# Patient Record
Sex: Female | Born: 1990 | Race: Black or African American | Hispanic: No | Marital: Single | Smoking: Never smoker
Health system: Southern US, Community
[De-identification: ages and names within clinical notes are randomized; demographics above are authoritative.]

## PROBLEM LIST (undated history)

## (undated) ENCOUNTER — Inpatient Hospital Stay (HOSPITAL_COMMUNITY): Payer: Self-pay

## (undated) DIAGNOSIS — R87629 Unspecified abnormal cytological findings in specimens from vagina: Secondary | ICD-10-CM

## (undated) DIAGNOSIS — IMO0001 Reserved for inherently not codable concepts without codable children: Secondary | ICD-10-CM

## (undated) DIAGNOSIS — IMO0002 Reserved for concepts with insufficient information to code with codable children: Secondary | ICD-10-CM

## (undated) DIAGNOSIS — B977 Papillomavirus as the cause of diseases classified elsewhere: Secondary | ICD-10-CM

## (undated) DIAGNOSIS — N83209 Unspecified ovarian cyst, unspecified side: Secondary | ICD-10-CM

## (undated) DIAGNOSIS — Z9189 Other specified personal risk factors, not elsewhere classified: Secondary | ICD-10-CM

## (undated) DIAGNOSIS — Z8679 Personal history of other diseases of the circulatory system: Secondary | ICD-10-CM

## (undated) DIAGNOSIS — B009 Herpesviral infection, unspecified: Secondary | ICD-10-CM

## (undated) DIAGNOSIS — I10 Essential (primary) hypertension: Secondary | ICD-10-CM

## (undated) DIAGNOSIS — Z889 Allergy status to unspecified drugs, medicaments and biological substances status: Secondary | ICD-10-CM

## (undated) DIAGNOSIS — I209 Angina pectoris, unspecified: Secondary | ICD-10-CM

## (undated) DIAGNOSIS — Z8619 Personal history of other infectious and parasitic diseases: Secondary | ICD-10-CM

## (undated) DIAGNOSIS — A5901 Trichomonal vulvovaginitis: Secondary | ICD-10-CM

## (undated) DIAGNOSIS — A549 Gonococcal infection, unspecified: Secondary | ICD-10-CM

## (undated) DIAGNOSIS — B379 Candidiasis, unspecified: Secondary | ICD-10-CM

## (undated) DIAGNOSIS — R87619 Unspecified abnormal cytological findings in specimens from cervix uteri: Secondary | ICD-10-CM

## (undated) DIAGNOSIS — N390 Urinary tract infection, site not specified: Secondary | ICD-10-CM

## (undated) DIAGNOSIS — A749 Chlamydial infection, unspecified: Secondary | ICD-10-CM

## (undated) DIAGNOSIS — R896 Abnormal cytological findings in specimens from other organs, systems and tissues: Secondary | ICD-10-CM

## (undated) HISTORY — DX: Abnormal cytological findings in specimens from other organs, systems and tissues: R89.6

## (undated) HISTORY — DX: Angina pectoris, unspecified: I20.9

## (undated) HISTORY — DX: Personal history of other infectious and parasitic diseases: Z86.19

## (undated) HISTORY — DX: Candidiasis, unspecified: B37.9

## (undated) HISTORY — PX: DILATION AND CURETTAGE OF UTERUS: SHX78

## (undated) HISTORY — DX: Herpesviral infection, unspecified: B00.9

## (undated) HISTORY — DX: Trichomonal vulvovaginitis: A59.01

## (undated) HISTORY — DX: Personal history of other diseases of the circulatory system: Z86.79

## (undated) HISTORY — DX: Gonococcal infection, unspecified: A54.9

## (undated) HISTORY — DX: Papillomavirus as the cause of diseases classified elsewhere: B97.7

## (undated) HISTORY — DX: Chlamydial infection, unspecified: A74.9

## (undated) HISTORY — DX: Reserved for inherently not codable concepts without codable children: IMO0001

---
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
## 2008-05-22 ENCOUNTER — Encounter: Payer: Self-pay | Admitting: Gynecology

## 2008-05-22 ENCOUNTER — Other Ambulatory Visit: Admission: RE | Admit: 2008-05-22 | Discharge: 2008-05-22 | Payer: Self-pay | Admitting: Gynecology

## 2008-05-22 ENCOUNTER — Ambulatory Visit: Payer: Self-pay | Admitting: Gynecology

## 2008-06-27 ENCOUNTER — Ambulatory Visit: Payer: Self-pay | Admitting: Gynecology

## 2008-06-27 ENCOUNTER — Encounter: Payer: Self-pay | Admitting: Gynecology

## 2008-10-04 ENCOUNTER — Ambulatory Visit: Payer: Self-pay | Admitting: Gynecology

## 2009-01-25 ENCOUNTER — Emergency Department (HOSPITAL_COMMUNITY): Admission: EM | Admit: 2009-01-25 | Discharge: 2009-01-25 | Payer: Self-pay | Admitting: Emergency Medicine

## 2009-01-29 ENCOUNTER — Emergency Department (HOSPITAL_COMMUNITY): Admission: EM | Admit: 2009-01-29 | Discharge: 2009-01-29 | Payer: Self-pay | Admitting: Family Medicine

## 2009-07-30 ENCOUNTER — Inpatient Hospital Stay (HOSPITAL_COMMUNITY): Admission: AD | Admit: 2009-07-30 | Discharge: 2009-07-30 | Payer: Self-pay | Admitting: Obstetrics & Gynecology

## 2009-08-05 ENCOUNTER — Inpatient Hospital Stay (HOSPITAL_COMMUNITY): Admission: AD | Admit: 2009-08-05 | Discharge: 2009-08-05 | Payer: Self-pay | Admitting: Gynecology

## 2009-08-07 ENCOUNTER — Ambulatory Visit: Payer: Self-pay | Admitting: Gynecology

## 2009-08-13 ENCOUNTER — Inpatient Hospital Stay (HOSPITAL_COMMUNITY): Admission: AD | Admit: 2009-08-13 | Discharge: 2009-08-13 | Payer: Self-pay | Admitting: Gynecology

## 2009-08-16 ENCOUNTER — Ambulatory Visit: Payer: Self-pay | Admitting: Gynecology

## 2009-09-25 ENCOUNTER — Ambulatory Visit: Payer: Self-pay | Admitting: Gynecology

## 2010-01-28 ENCOUNTER — Ambulatory Visit: Payer: Self-pay | Admitting: Gynecology

## 2010-03-04 ENCOUNTER — Other Ambulatory Visit: Admission: RE | Admit: 2010-03-04 | Discharge: 2010-03-04 | Payer: Self-pay | Admitting: Gynecology

## 2010-03-04 ENCOUNTER — Ambulatory Visit: Payer: Self-pay | Admitting: Gynecology

## 2010-03-08 ENCOUNTER — Ambulatory Visit (HOSPITAL_COMMUNITY): Admission: RE | Admit: 2010-03-08 | Discharge: 2010-03-08 | Payer: Self-pay | Admitting: Gynecology

## 2010-04-23 ENCOUNTER — Ambulatory Visit: Payer: Self-pay | Admitting: Gynecology

## 2010-05-07 ENCOUNTER — Ambulatory Visit: Payer: Self-pay | Admitting: Gynecology

## 2010-07-04 ENCOUNTER — Emergency Department (HOSPITAL_COMMUNITY): Admission: EM | Admit: 2010-07-04 | Discharge: 2010-07-04 | Payer: Self-pay | Admitting: Family Medicine

## 2010-07-12 ENCOUNTER — Ambulatory Visit: Payer: Self-pay | Admitting: Gynecology

## 2010-08-06 ENCOUNTER — Ambulatory Visit: Payer: Self-pay | Admitting: Gynecology

## 2010-08-23 ENCOUNTER — Ambulatory Visit: Payer: Self-pay | Admitting: Gynecology

## 2010-09-10 ENCOUNTER — Inpatient Hospital Stay (HOSPITAL_COMMUNITY)
Admission: AD | Admit: 2010-09-10 | Discharge: 2010-09-10 | Payer: Self-pay | Source: Home / Self Care | Attending: Gynecology | Admitting: Gynecology

## 2010-09-11 LAB — URINALYSIS, ROUTINE W REFLEX MICROSCOPIC
Bilirubin Urine: NEGATIVE
Hgb urine dipstick: NEGATIVE
Ketones, ur: 15 mg/dL — AB
Nitrite: NEGATIVE
Protein, ur: NEGATIVE mg/dL
Specific Gravity, Urine: 1.015 (ref 1.005–1.030)
Urine Glucose, Fasting: NEGATIVE mg/dL
Urobilinogen, UA: 0.2 mg/dL (ref 0.0–1.0)
pH: 7 (ref 5.0–8.0)

## 2010-09-11 LAB — POCT PREGNANCY, URINE: Preg Test, Ur: NEGATIVE

## 2010-09-14 ENCOUNTER — Encounter: Payer: Self-pay | Admitting: Obstetrics & Gynecology

## 2010-09-16 LAB — GC/CHLAMYDIA PROBE AMP, GENITAL
Chlamydia, DNA Probe: NEGATIVE
GC Probe Amp, Genital: NEGATIVE

## 2010-11-05 LAB — WET PREP, GENITAL
Clue Cells Wet Prep HPF POC: NONE SEEN
Trich, Wet Prep: NONE SEEN

## 2010-11-05 LAB — POCT URINALYSIS DIPSTICK
Bilirubin Urine: NEGATIVE
Glucose, UA: NEGATIVE mg/dL
Ketones, ur: NEGATIVE mg/dL
Nitrite: NEGATIVE
Protein, ur: 30 mg/dL — AB
Specific Gravity, Urine: 1.02 (ref 1.005–1.030)
Urobilinogen, UA: 1 mg/dL (ref 0.0–1.0)
pH: 8.5 — ABNORMAL HIGH (ref 5.0–8.0)

## 2010-11-05 LAB — POCT PREGNANCY, URINE: Preg Test, Ur: NEGATIVE

## 2010-11-05 LAB — GC/CHLAMYDIA PROBE AMP, GENITAL
Chlamydia, DNA Probe: POSITIVE — AB
GC Probe Amp, Genital: NEGATIVE

## 2010-11-25 LAB — WET PREP, GENITAL
Clue Cells Wet Prep HPF POC: NONE SEEN
Trich, Wet Prep: NONE SEEN

## 2010-11-25 LAB — URINE CULTURE: Colony Count: 85000

## 2010-11-25 LAB — COMPREHENSIVE METABOLIC PANEL
Albumin: 3.5 g/dL (ref 3.5–5.2)
Alkaline Phosphatase: 59 U/L (ref 39–117)
BUN: 4 mg/dL — ABNORMAL LOW (ref 6–23)
Calcium: 9 mg/dL (ref 8.4–10.5)
Potassium: 3.4 mEq/L — ABNORMAL LOW (ref 3.5–5.1)
Total Protein: 6.4 g/dL (ref 6.0–8.3)

## 2010-11-25 LAB — URINE MICROSCOPIC-ADD ON

## 2010-11-25 LAB — DIFFERENTIAL
Basophils Relative: 0 % (ref 0–1)
Eosinophils Relative: 2 % (ref 0–5)
Lymphocytes Relative: 26 % (ref 12–46)
Monocytes Relative: 9 % (ref 3–12)
Neutro Abs: 4.6 10*3/uL (ref 1.7–7.7)
Neutrophils Relative %: 63 % (ref 43–77)

## 2010-11-25 LAB — CBC
HCT: 38.4 % (ref 36.0–46.0)
Hemoglobin: 12.3 g/dL (ref 12.0–15.0)
MCHC: 31.9 g/dL (ref 30.0–36.0)
MCV: 71.9 fL — ABNORMAL LOW (ref 78.0–100.0)
Platelets: 342 10*3/uL (ref 150–400)
RBC: 5.34 MIL/uL — ABNORMAL HIGH (ref 3.87–5.11)
RDW: 15.6 % — ABNORMAL HIGH (ref 11.5–15.5)
WBC: 7.3 10*3/uL (ref 4.0–10.5)

## 2010-11-25 LAB — URINALYSIS, ROUTINE W REFLEX MICROSCOPIC
Bilirubin Urine: NEGATIVE
Glucose, UA: NEGATIVE mg/dL
Hgb urine dipstick: NEGATIVE
Ketones, ur: NEGATIVE mg/dL
Nitrite: NEGATIVE
Protein, ur: NEGATIVE mg/dL
Specific Gravity, Urine: >1.03 — ABNORMAL HIGH (ref 1.005–1.030)
Urobilinogen, UA: 0.2 mg/dL (ref 0.0–1.0)
pH: 6 (ref 5.0–8.0)

## 2010-11-26 LAB — URINE CULTURE: Colony Count: 5000

## 2010-11-26 LAB — URINALYSIS, ROUTINE W REFLEX MICROSCOPIC
Nitrite: NEGATIVE
Protein, ur: NEGATIVE mg/dL
Specific Gravity, Urine: 1.02 (ref 1.005–1.030)
Urobilinogen, UA: 0.2 mg/dL (ref 0.0–1.0)

## 2010-11-26 LAB — URINE MICROSCOPIC-ADD ON

## 2010-11-26 LAB — ABO/RH: ABO/RH(D): B POS

## 2010-11-26 LAB — CBC
MCHC: 31.7 g/dL (ref 30.0–36.0)
MCV: 71.9 fL — ABNORMAL LOW (ref 78.0–100.0)
Platelets: 403 10*3/uL — ABNORMAL HIGH (ref 150–400)
RBC: 5.91 MIL/uL — ABNORMAL HIGH (ref 3.87–5.11)
WBC: 8.5 10*3/uL (ref 4.0–10.5)

## 2010-11-26 LAB — GC/CHLAMYDIA PROBE AMP, GENITAL: Chlamydia, DNA Probe: NEGATIVE

## 2010-11-26 LAB — HCG, QUANTITATIVE, PREGNANCY
hCG, Beta Chain, Quant, S: 11750 m[IU]/mL — ABNORMAL HIGH (ref <5)
hCG, Beta Chain, Quant, S: 2266 m[IU]/mL — ABNORMAL HIGH (ref <5)

## 2010-11-26 LAB — WET PREP, GENITAL: Yeast Wet Prep HPF POC: NONE SEEN

## 2010-12-02 LAB — POCT URINALYSIS DIP (DEVICE)
Bilirubin Urine: NEGATIVE
Ketones, ur: NEGATIVE mg/dL
Protein, ur: 30 mg/dL — AB
Specific Gravity, Urine: >=1.03 (ref 1.005–1.030)

## 2010-12-02 LAB — WET PREP, GENITAL
Clue Cells Wet Prep HPF POC: NONE SEEN
Trich, Wet Prep: NONE SEEN
WBC, Wet Prep HPF POC: NONE SEEN

## 2010-12-02 LAB — POCT PREGNANCY, URINE: Preg Test, Ur: NEGATIVE

## 2010-12-12 ENCOUNTER — Ambulatory Visit: Payer: Self-pay | Admitting: Gynecology

## 2011-01-01 ENCOUNTER — Ambulatory Visit (INDEPENDENT_AMBULATORY_CARE_PROVIDER_SITE_OTHER): Payer: 59 | Admitting: Gynecology

## 2011-01-01 DIAGNOSIS — Z113 Encounter for screening for infections with a predominantly sexual mode of transmission: Secondary | ICD-10-CM

## 2011-01-01 DIAGNOSIS — B373 Candidiasis of vulva and vagina: Secondary | ICD-10-CM

## 2011-01-01 DIAGNOSIS — N898 Other specified noninflammatory disorders of vagina: Secondary | ICD-10-CM

## 2011-01-01 DIAGNOSIS — B3731 Acute candidiasis of vulva and vagina: Secondary | ICD-10-CM

## 2011-02-06 ENCOUNTER — Ambulatory Visit: Payer: 59 | Admitting: Gynecology

## 2011-02-07 ENCOUNTER — Inpatient Hospital Stay (HOSPITAL_COMMUNITY)
Admission: AD | Admit: 2011-02-07 | Discharge: 2011-02-07 | Disposition: A | Discharge status: Home / Self Care | Payer: 59 | Source: Ambulatory Visit | Attending: Obstetrics & Gynecology | Admitting: Obstetrics & Gynecology

## 2011-02-07 DIAGNOSIS — R300 Dysuria: Secondary | ICD-10-CM | POA: Insufficient documentation

## 2011-02-07 DIAGNOSIS — N390 Urinary tract infection, site not specified: Secondary | ICD-10-CM | POA: Insufficient documentation

## 2011-02-07 LAB — URINALYSIS, ROUTINE W REFLEX MICROSCOPIC
Bilirubin Urine: NEGATIVE
Glucose, UA: NEGATIVE mg/dL
Specific Gravity, Urine: 1.025 (ref 1.005–1.030)
pH: 7 (ref 5.0–8.0)

## 2011-02-07 LAB — URINE MICROSCOPIC-ADD ON

## 2011-02-09 LAB — URINE CULTURE

## 2011-04-09 ENCOUNTER — Ambulatory Visit (INDEPENDENT_AMBULATORY_CARE_PROVIDER_SITE_OTHER): Payer: 59 | Admitting: Gynecology

## 2011-04-09 ENCOUNTER — Encounter: Payer: Self-pay | Admitting: Gynecology

## 2011-04-09 VITALS — BP 112/70

## 2011-04-09 DIAGNOSIS — A609 Anogenital herpesviral infection, unspecified: Secondary | ICD-10-CM

## 2011-04-09 DIAGNOSIS — B379 Candidiasis, unspecified: Secondary | ICD-10-CM

## 2011-04-09 DIAGNOSIS — L293 Anogenital pruritus, unspecified: Secondary | ICD-10-CM

## 2011-04-09 DIAGNOSIS — B009 Herpesviral infection, unspecified: Secondary | ICD-10-CM

## 2011-04-09 DIAGNOSIS — Z113 Encounter for screening for infections with a predominantly sexual mode of transmission: Secondary | ICD-10-CM

## 2011-04-09 DIAGNOSIS — N898 Other specified noninflammatory disorders of vagina: Secondary | ICD-10-CM

## 2011-04-09 MED ORDER — FLUCONAZOLE 150 MG PO TABS: 150.0000 mg | ORAL_TABLET | Freq: Once | ORAL | Status: AC

## 2011-04-09 MED ORDER — ACYCLOVIR 400 MG PO TABS: 800.0000 mg | ORAL_TABLET | Freq: Three times a day (TID) | ORAL | Status: AC

## 2011-04-09 MED ORDER — ACYCLOVIR 5 % EX CREA: 1.0000 "application " | TOPICAL_CREAM | CUTANEOUS | Status: DC

## 2011-04-09 NOTE — Progress Notes (Signed)
Patient to the office today with complaint of external genital itching as well as outbreak from her HSV. Last year she was diagnosed with HSV from a culture from the area of the fourchette. Review her record also indicated in May of this year she was treated for a yeast, trichomoniasis and Chlamydia infection. She has been treated for gonorrhea in 2006 2 times in 2007 for Chlamydia as was in 2009 in 2011.  Pelvic exam: Bartholin urethra Skene glands: Area of the fourchette with evidence of herpetic outbreak Vagina: Thick white discharge Cervix thick white discharge Bimanual exam: Not done  Assessment: HSV-2 outbreak. Will prescribe Valtrex 800 mg 3 times a day for the next 7-10 days. Acyclovir cream to apply 3-4 times a day. For her yeast infection prescription for Diflucan 150 mg one by mouth was prescribed. GC and Chlamydia culture obtained pending at time of this dictation. Patient will return back next month for her annual exam which is overdue.

## 2011-04-16 ENCOUNTER — Ambulatory Visit: Payer: 59 | Admitting: Gynecology

## 2011-04-26 ENCOUNTER — Encounter (HOSPITAL_COMMUNITY): Payer: Self-pay

## 2011-04-26 ENCOUNTER — Inpatient Hospital Stay (HOSPITAL_COMMUNITY)
Admission: AD | Admit: 2011-04-26 | Discharge: 2011-04-26 | Disposition: A | Discharge status: Home / Self Care | Payer: 59 | Source: Ambulatory Visit | Attending: Gynecology | Admitting: Gynecology

## 2011-04-26 DIAGNOSIS — R109 Unspecified abdominal pain: Secondary | ICD-10-CM

## 2011-04-26 DIAGNOSIS — R51 Headache: Secondary | ICD-10-CM

## 2011-04-26 DIAGNOSIS — R519 Headache, unspecified: Secondary | ICD-10-CM

## 2011-04-26 DIAGNOSIS — O26899 Other specified pregnancy related conditions, unspecified trimester: Secondary | ICD-10-CM

## 2011-04-26 DIAGNOSIS — O99891 Other specified diseases and conditions complicating pregnancy: Secondary | ICD-10-CM | POA: Insufficient documentation

## 2011-04-26 HISTORY — DX: Reserved for concepts with insufficient information to code with codable children: IMO0002

## 2011-04-26 HISTORY — DX: Urinary tract infection, site not specified: N39.0

## 2011-04-26 HISTORY — DX: Unspecified abnormal cytological findings in specimens from cervix uteri: R87.619

## 2011-04-26 HISTORY — DX: Herpesviral infection, unspecified: B00.9

## 2011-04-26 HISTORY — DX: Unspecified ovarian cyst, unspecified side: N83.209

## 2011-04-26 LAB — URINALYSIS, ROUTINE W REFLEX MICROSCOPIC
Glucose, UA: NEGATIVE mg/dL
Ketones, ur: NEGATIVE mg/dL
Leukocytes, UA: NEGATIVE
Protein, ur: NEGATIVE mg/dL
Urobilinogen, UA: 0.2 mg/dL (ref 0.0–1.0)

## 2011-04-26 LAB — WET PREP, GENITAL

## 2011-04-26 MED ORDER — ACETAMINOPHEN 500 MG PO TABS
1000.0000 mg | ORAL_TABLET | Freq: Once | ORAL | Status: AC
  Administered 2011-04-26: 1000 mg via ORAL

## 2011-04-26 NOTE — Progress Notes (Signed)
Pt states," I missed my period and had a positive HPT. I started having pain in my left lower abdomen, and somedays real bad cramps in my low abd."

## 2011-04-26 NOTE — ED Provider Notes (Signed)
History     Chief Complaint  Patient presents with  . Abdominal Pain   HPI Kara Fernandez 20 y.o. Comes in with headache, periodic LLQ cramping and missed menses.  Has an appointment in the office scheduled for Thursday.  Had not done a pregnancy test and did not know she is pregnant.    OB History    Grav Para Term Preterm Abortions TAB SAB Ect Mult Living   2    1 1           Past Medical History  Diagnosis Date  . Chlamydia infection     POSITIVE 10/06,8/07,7/09,11/11  . ASCUS on Pap smear     C&B 10/09 W/LGSIL  . HSV (herpes simplex virus) infection   . 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 Diabetes in pregnancy 01/2005  . Gonorrhea     POSITIVE 8/06, 10/06,  . GC (gonococcus infection)   . Trichomonal vulvovaginitis   . Ovarian cyst   . UTI (lower urinary tract infection)   . Abnormal Pap smear   . HPV (human papilloma virus) infection   . 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 Chlamydia     Past Surgical History  Procedure Date  . Dilation and curettage of uterus     abortion    Family History  Problem Relation Age of Onset  . Hypertension Mother   . Hypertension Father   . Hypertension Maternal Grandmother   . Hypertension Maternal Grandfather     History  Substance Use Topics  . Smoking status: Former Games developer  . Smokeless tobacco: Never Used  . Alcohol Use: 0.6 oz/week    1 Shots of liquor per week     occasionally    Allergies: No Known Allergies  Prescriptions prior to admission  Medication Sig Dispense Refill  . prenatal vitamin w/FE, FA (PRENATAL 1 + 1) 27-1 MG TABS Take 1 tablet by mouth daily.        Marland Kitchen VALTREX 1 G tablet Take 1,000 mg by mouth Once daily as needed. Take for 7-10 days for outbreaks      . DISCONTD: acyclovir (ZOVIRAX) 5 % cream Apply 1 application topically every 3 (three) hours.  15 g  1  . DISCONTD: Multiple Vitamin (MULTIVITAMIN) tablet Take 1 tablet by mouth  daily.        Marland Kitchen DISCONTD: norethindrone-ethinyl estradiol (JUNEL FE 1/20) 1-20 MG-MCG per tablet Take 1 tablet by mouth daily.          Review of Systems  Gastrointestinal: Positive for nausea. Negative for vomiting.  Genitourinary:       No vaginal discharge. No vaginal bleeding. No dysuria. Periodic LLQ cramping - none today    Neurological: Positive for headaches.   Physical Exam   Blood pressure 125/75, pulse 82, temperature 97.4 F (36.3 C), temperature source Oral, resp. rate 18, height 5' 1.5" (1.562 m), weight 131 lb 4 oz (59.535 kg), last menstrual period 03/23/2011.  Physical Exam  Nursing note and vitals reviewed. Constitutional: She is oriented to person, place, and time. She appears well-developed and well-nourished.  HENT:  Head: Normocephalic.  Eyes: EOM are normal.  Neck: Neck supple.  GI: Soft. There is no tenderness. There is no rebound and no guarding.  Genitourinary:       Speculum exam: Vagina - Small amount of creamy discharge, no odor Cervix - No contact  bleeding Bimanual exam: Cervix closed Uterus non tender, normal size Adnexa non tender, no masses bilaterally GC/Chlam, wet prep done Chaperone present for exam.    Musculoskeletal: Normal range of motion.  Neurological: She is alert and oriented to person, place, and time.  Skin: Skin is warm and dry.  Psychiatric: She has a normal mood and affect.    MAU Course  Procedures Consult with Dr. Audie Box re: plan of care.  MDM Results for orders placed during the hospital encounter of 04/26/11 (from the past 24 hour(s))  URINALYSIS, ROUTINE W REFLEX MICROSCOPIC     Status: Normal   Collection Time   04/26/11  8:25 PM      Component Value Range   Color, Urine YELLOW  YELLOW    Appearance CLEAR  CLEAR    Specific Gravity, Urine 1.020  1.005 - 1.030    pH 6.0  5.0 - 8.0    Glucose, UA NEGATIVE  NEGATIVE (mg/dL)   Hgb urine dipstick NEGATIVE  NEGATIVE    Bilirubin Urine NEGATIVE  NEGATIVE     Ketones, ur NEGATIVE  NEGATIVE (mg/dL)   Protein, ur NEGATIVE  NEGATIVE (mg/dL)   Urobilinogen, UA 0.2  0.0 - 1.0 (mg/dL)   Nitrite NEGATIVE  NEGATIVE    Leukocytes, UA NEGATIVE  NEGATIVE   POCT PREGNANCY, URINE     Status: Normal   Collection Time   04/26/11  8:43 PM      Component Value Range   Preg Test, Ur POSITIVE    WET PREP, GENITAL     Status: Abnormal   Collection Time   04/26/11  9:20 PM      Component Value Range   Yeast, Wet Prep NONE SEEN  NONE SEEN    Trich, Wet Prep NONE SEEN  NONE SEEN    Clue Cells, Wet Prep FEW (*) NONE SEEN    WBC, Wet Prep HPF POC FEW (*) NONE SEEN      Assessment and Plan  Early Pregnancy Headache  Plan: Your pregnancy test is positive.   Be seen in the office on Tuesday for follow up Return to MAU immediately if having vaginal bleeding or any worse abdominal pain. Drink at least 8 8-oz glasses of water every day. No smoking, no drugs, no alcohol.   Take a prenatal vitamin one by mouth every day.   Eat small frequent snacks to avoid nausea.   Take Tylenol 325 mg 2 tablets by mouth every 4 hours if needed for pain.    BURLESON,TERRI 04/26/2011, 10:05 PM   Nolene Bernheim, NP 04/26/11 2302

## 2011-04-26 NOTE — Progress Notes (Signed)
Patient is here with c/o left side pain for 3 weeks. She states that she she has missed her august periods, having sleepless nights, headache at the back of her head. She also c/o lower back pain, left lower quadrant. She has frequency in urinating. Denies any vaginal bleeding, or discharge.

## 2011-04-29 ENCOUNTER — Encounter: Payer: Self-pay | Admitting: Gynecology

## 2011-04-29 ENCOUNTER — Ambulatory Visit (INDEPENDENT_AMBULATORY_CARE_PROVIDER_SITE_OTHER): Payer: 59 | Admitting: Gynecology

## 2011-04-29 DIAGNOSIS — Z331 Pregnant state, incidental: Secondary | ICD-10-CM

## 2011-04-29 DIAGNOSIS — N912 Amenorrhea, unspecified: Secondary | ICD-10-CM

## 2011-04-29 DIAGNOSIS — N898 Other specified noninflammatory disorders of vagina: Secondary | ICD-10-CM

## 2011-04-29 LAB — POCT URINE PREGNANCY: Preg Test, Ur: POSITIVE

## 2011-04-29 NOTE — Progress Notes (Signed)
Patient is a 20 year old now gravida 2 para 1 AB 1 who presented to the office stating that she had a last menstrual period on July 29. She was not using any form of contraception and has otherwise been having normal menstrual cycles. She did a home pregnancy test x2 which was positive and was confirmed here in the office today as well. She denied a nausea or vomiting some headaches and breast tenderness. Review of her records indicated that she has had several outbreaks of gonorrhea Chlamydia infections as well as HSV. She denied any pelvic pain or any unusual vaginal bleeding.  Pelvic exam Bartholin urethra Skene glands: Normal Vagina: No gross lesions on inspection Cervix: No lesions or discharge Uterus: Anteverted normal size shape and consistency for 6 weeks size Adnexa: No palpable mass or tenderness Rectal exam: Not done  Assessment 20 year old gravida 2 para 1 AB 1 last menstrual period 03/23/2011 estimated gestational age based on last menstrual period 5-1/2 weeks with that educated as estimated date of confinement being 12/29/2010. Patient high-risk for ectopic pregnancy due the fact she's had multiple episodes of infections with gonorrhea and chlamydia as well as HSV. We will get a quantitative beta-hCG today call the patient within the next 48 hours with a result and determine when would be the next quantitative beta-hCG and subsequent ultrasound to confirm an intrauterine pregnancy as early as possible because of her high-risk nature. Patient is fully aware of the potential risk and warning signs had been discussed. She is already on her prenatal vitamins. And we'll follow accordingly. She states that she is going to have this pregnancy. The last pregnancy was an elective termination during the first trimester.

## 2011-04-29 NOTE — Patient Instructions (Signed)
Will call you later in week with instructions as to when to come by for another blood test and when to come in for ultrasound.

## 2011-05-01 ENCOUNTER — Ambulatory Visit (INDEPENDENT_AMBULATORY_CARE_PROVIDER_SITE_OTHER): Payer: 59 | Admitting: Gynecology

## 2011-05-01 ENCOUNTER — Other Ambulatory Visit: Payer: Self-pay

## 2011-05-01 ENCOUNTER — Ambulatory Visit: Payer: 59 | Admitting: Gynecology

## 2011-05-01 DIAGNOSIS — Z331 Pregnant state, incidental: Secondary | ICD-10-CM

## 2011-05-01 DIAGNOSIS — Z3490 Encounter for supervision of normal pregnancy, unspecified, unspecified trimester: Secondary | ICD-10-CM

## 2011-05-01 DIAGNOSIS — O9989 Other specified diseases and conditions complicating pregnancy, childbirth and the puerperium: Secondary | ICD-10-CM

## 2011-05-01 DIAGNOSIS — N912 Amenorrhea, unspecified: Secondary | ICD-10-CM

## 2011-05-01 LAB — US OB TRANSVAGINAL

## 2011-05-01 NOTE — Progress Notes (Signed)
Patient is a 20 year old who seen in the office on September 4 as a result of her been amenorrheic and a positive urine pregnancy test. We had done a quantitative beta-hCG on that day the value was 5258 milli-international units per mL. She is a high-risk for an ectopic pregnancy the fact that she has had several episodes with infections for gonorrhea and chlamydia as well as HSV. She was asymptomatic that day and her pelvic exam and demonstrated uterus 4-6 weeks size. She return today to for an ultrasound to confirm an intrauterine pregnancy and for possible viability.  Ultrasound today demonstrated an anteverted uterus with a single intrauterine gestational sac located in the fundus size consistent with dates 5 weeks and 4 days a normal-shaped yolk sac was seen. The fetal pole was seen cardiac activity not visualized yet right ovary had a small corpus luteum cyst measuring 20 mm left ovary was normal cervix was long closed and no apparent adnexal masses. Based on her last menstrual period and her ultrasound today her estimated date of confinement is 12/28/2011. She'll return back to the office in 3 weeks to confirm viability and that we'll refer her to one of my obstetrical colleagues. She's currently are prenatal vitamins. She denied any nausea vomiting and no complaints today.

## 2011-05-02 ENCOUNTER — Encounter: Payer: 59 | Admitting: Gynecology

## 2011-05-21 ENCOUNTER — Other Ambulatory Visit: Payer: 59

## 2011-05-21 ENCOUNTER — Ambulatory Visit (INDEPENDENT_AMBULATORY_CARE_PROVIDER_SITE_OTHER): Payer: 59 | Admitting: Gynecology

## 2011-05-21 ENCOUNTER — Ambulatory Visit: Payer: 59

## 2011-05-21 ENCOUNTER — Encounter: Payer: Self-pay | Admitting: Gynecology

## 2011-05-21 ENCOUNTER — Ambulatory Visit: Payer: 59 | Admitting: Women's Health

## 2011-05-21 ENCOUNTER — Ambulatory Visit: Payer: 59 | Admitting: Gynecology

## 2011-05-21 ENCOUNTER — Ambulatory Visit: Admission: RE | Admit: 2011-05-21 | Payer: 59 | Source: Ambulatory Visit

## 2011-05-21 DIAGNOSIS — O9989 Other specified diseases and conditions complicating pregnancy, childbirth and the puerperium: Secondary | ICD-10-CM

## 2011-05-21 DIAGNOSIS — O358XX0 Maternal care for other (suspected) fetal abnormality and damage, not applicable or unspecified: Secondary | ICD-10-CM

## 2011-05-21 DIAGNOSIS — N912 Amenorrhea, unspecified: Secondary | ICD-10-CM

## 2011-05-21 DIAGNOSIS — Z3200 Encounter for pregnancy test, result unknown: Secondary | ICD-10-CM

## 2011-05-21 LAB — US OB TRANSVAGINAL

## 2011-05-21 NOTE — Progress Notes (Signed)
Patient is a 20 year old now gravida 1 who was seen in the office on September 6 by she had a positive urine pregnancy test was followed with quantitative beta hCGs. She was considered a high-risk for an ectopic pregnancy due the fact she's had several episodes of pelvic infection to include gonorrhea and Chlamydia and has history of HSV. She had been asymptomatic otherwise with the exception of mild nausea. Patient presented to the office for ultrasound today the ultrasound today demonstrated an intrauterine viable pregnancy consistent with dates fetal pole was noted cardiac activity was noted 164 beats per minute. Fetal movement was evident. Noted was an umbilical cord cyst measured 7 x 6 mm. Amnion was seen. Normal yolk sac was seen. Right corpus luteum cyst was noted. Left ovary was normal cervix was long closed. Based on her last menstrual period and ultrasound she's currently 8 weeks and 3 days with an estimated date of confinement 12/27/2011. She's currently on prenatal vitamins and was given a prescription for Reglan to take one 10 mg tablet every 4-6 hours when necessary nausea vomiting. She will be referred to my obstetrical colleagues for her prenatal care and we'll send him a copy of this office note.

## 2011-05-29 ENCOUNTER — Encounter: Payer: Self-pay | Admitting: Gynecology

## 2011-06-07 ENCOUNTER — Inpatient Hospital Stay (HOSPITAL_COMMUNITY)
Admission: AD | Admit: 2011-06-07 | Discharge: 2011-06-07 | Disposition: A | Discharge status: Home / Self Care | Payer: 59 | Source: Ambulatory Visit | Attending: Obstetrics and Gynecology | Admitting: Obstetrics and Gynecology

## 2011-06-07 DIAGNOSIS — O2390 Unspecified genitourinary tract infection in pregnancy, unspecified trimester: Secondary | ICD-10-CM | POA: Insufficient documentation

## 2011-06-07 DIAGNOSIS — N390 Urinary tract infection, site not specified: Secondary | ICD-10-CM

## 2011-06-07 LAB — URINALYSIS, ROUTINE W REFLEX MICROSCOPIC
Glucose, UA: NEGATIVE mg/dL
Ketones, ur: 15 mg/dL — AB
Leukocytes, UA: NEGATIVE
Nitrite: NEGATIVE
Protein, ur: NEGATIVE mg/dL
Urobilinogen, UA: 0.2 mg/dL (ref 0.0–1.0)

## 2011-06-07 MED ORDER — CEPHALEXIN 500 MG PO CAPS: 500.0000 mg | ORAL_CAPSULE | Freq: Four times a day (QID) | ORAL | Status: AC

## 2011-06-07 NOTE — ED Provider Notes (Signed)
History   Pt presents today c/o urinary sx for the past several days. She states she has noticed a great deal of urinary frequency and hesitancy and today she noticed some increased pressure when she urinated. She denies abd pain, vag dc, bleeding, or burning. She states "it feels like other UTIs."  Chief Complaint  Patient presents with  . Urinary Tract Infection   HPI  OB History    Grav Para Term Preterm Abortions TAB SAB Ect Mult Living   2    1 1           Past Medical History  Diagnosis Date  . Chlamydia infection     POSITIVE 10/06,8/07,7/09,11/11  . ASCUS on Pap smear     C&B 10/09 W/LGSIL  . HSV (herpes simplex virus) infection   . 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 Gonorrhea     POSITIVE 8/06, 10/06,  . GC (gonococcus infection)   . Trichomonal vulvovaginitis   . Ovarian cyst   . UTI (lower urinary tract infection)   . Abnormal Pap smear   . HPV (human papilloma virus) infection   . 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 Chlamydia     Past Surgical History  Procedure Date  . Dilation and curettage of uterus     abortion    Family History  Problem Relation Age of Onset  . Hypertension Mother   . Hypertension Father   . Hypertension Maternal Grandmother   . Hypertension Maternal Grandfather     History  Substance Use Topics  . Smoking status: Former Games developer  . Smokeless tobacco: Never Used  . Alcohol Use: 0.6 oz/week    1 Shots of liquor per week     occasionally    Allergies: No Known Allergies  Prescriptions prior to admission  Medication Sig Dispense Refill  . prenatal vitamin w/FE, FA (PRENATAL 1 + 1) 27-1 MG TABS Take 1 tablet by mouth daily.       Marland Kitchen VALTREX 1 G tablet Take 1,000 mg by mouth Once daily as needed. Take for 7-10 days for outbreaks        Review of Systems  Constitutional: Negative for fever.  Cardiovascular: Negative for chest pain.  Gastrointestinal: Negative for  nausea, vomiting, abdominal pain, diarrhea and constipation.  Genitourinary: Positive for urgency and frequency. Negative for dysuria, hematuria and flank pain.  Neurological: Negative for dizziness and headaches.  Psychiatric/Behavioral: Negative for depression and suicidal ideas.   Physical Exam   Blood pressure 113/66, pulse 87, temperature 98.3 F (36.8 C), temperature source Oral, resp. rate 20, height 5\' 1"  (1.549 m), weight 127 lb 9.6 oz (57.879 kg), last menstrual period 03/23/2011.  Physical Exam  Constitutional: She is oriented to person, place, and time. She appears well-developed and well-nourished. No distress.  HENT:  Head: Normocephalic and atraumatic.  Eyes: EOM are normal. Pupils are equal, round, and reactive to light.  GI: Soft. She exhibits no distension and no mass. There is no tenderness. There is no rebound and no guarding.  Neurological: She is alert and oriented to person, place, and time.  Skin: Skin is warm and dry. She is not diaphoretic.  Psychiatric: She has a normal mood and affect. Her behavior is normal. Judgment and thought content normal.    MAU Course  Procedures  Results for orders placed during the hospital encounter of 06/07/11 (from the  past 24 hour(s))  URINALYSIS, ROUTINE W REFLEX MICROSCOPIC     Status: Abnormal   Collection Time   06/07/11  6:41 PM      Component Value Range   Color, Urine YELLOW  YELLOW    Appearance HAZY (*) CLEAR    Specific Gravity, Urine 1.025  1.005 - 1.030    pH 6.0  5.0 - 8.0    Glucose, UA NEGATIVE  NEGATIVE (mg/dL)   Hgb urine dipstick NEGATIVE  NEGATIVE    Bilirubin Urine NEGATIVE  NEGATIVE    Ketones, ur 15 (*) NEGATIVE (mg/dL)   Protein, ur NEGATIVE  NEGATIVE (mg/dL)   Urobilinogen, UA 0.2  0.0 - 1.0 (mg/dL)   Nitrite NEGATIVE  NEGATIVE    Leukocytes, UA NEGATIVE  NEGATIVE    Urine sent for culture.  Assessment and Plan  UTI sx: discussed with pt at length. Will tx prophylactically with keflex  despite neg UA. Will send urine for culture. Advised pt to f/u with her PCP. Discussed diet, activity, risks, and precautions.  [name], DrHSc, MPAS, PA-C  06/07/2011, 7:29 PM   [name], PA 06/07/11 1933

## 2011-06-07 NOTE — Progress Notes (Signed)
Pt reports she feels like she has a UTI. Pt reports urinary frequency and pain. Pt also reports occational  feelings like her heart is racing.

## 2011-06-09 LAB — URINE CULTURE: Colony Count: 30000

## 2011-06-09 NOTE — ED Provider Notes (Signed)
Agree with above note.  Kara Fernandez 06/09/2011 2:07 PM   

## 2011-07-21 LAB — OB RESULTS CONSOLE RPR: RPR: NONREACTIVE

## 2011-07-21 LAB — OB RESULTS CONSOLE HEPATITIS B SURFACE ANTIGEN: Hepatitis B Surface Ag: NEGATIVE

## 2011-07-21 LAB — CBC: Platelets: 352 10*3/uL (ref 150–399)

## 2011-10-22 ENCOUNTER — Encounter (INDEPENDENT_AMBULATORY_CARE_PROVIDER_SITE_OTHER): Payer: Medicaid Other | Admitting: Registered Nurse

## 2011-10-22 DIAGNOSIS — Z331 Pregnant state, incidental: Secondary | ICD-10-CM

## 2011-11-06 ENCOUNTER — Encounter: Payer: Medicaid Other | Admitting: Obstetrics and Gynecology

## 2011-11-13 ENCOUNTER — Encounter (INDEPENDENT_AMBULATORY_CARE_PROVIDER_SITE_OTHER): Payer: Medicaid Other | Admitting: Obstetrics and Gynecology

## 2011-11-13 DIAGNOSIS — Z3480 Encounter for supervision of other normal pregnancy, unspecified trimester: Secondary | ICD-10-CM

## 2011-11-27 ENCOUNTER — Encounter (INDEPENDENT_AMBULATORY_CARE_PROVIDER_SITE_OTHER): Payer: Medicaid Other | Admitting: Registered Nurse

## 2011-11-27 DIAGNOSIS — Z331 Pregnant state, incidental: Secondary | ICD-10-CM

## 2011-12-10 DIAGNOSIS — B009 Herpesviral infection, unspecified: Secondary | ICD-10-CM | POA: Insufficient documentation

## 2011-12-11 ENCOUNTER — Ambulatory Visit (INDEPENDENT_AMBULATORY_CARE_PROVIDER_SITE_OTHER): Payer: Medicaid Other | Admitting: Obstetrics and Gynecology

## 2011-12-11 ENCOUNTER — Encounter: Payer: Self-pay | Admitting: Obstetrics and Gynecology

## 2011-12-11 VITALS — BP 102/56 | Wt 150.0 lb

## 2011-12-11 DIAGNOSIS — Z331 Pregnant state, incidental: Secondary | ICD-10-CM

## 2011-12-11 MED ORDER — VALACYCLOVIR HCL 500 MG PO TABS: 500.0000 mg | ORAL_TABLET | Freq: Every day | ORAL | Status: DC

## 2011-12-11 NOTE — Patient Instructions (Signed)

## 2011-12-11 NOTE — Progress Notes (Signed)
Kara Fernandez [redacted]w[redacted]d   Doing well GFM No ctx, VB or LOF C/o pedal and hand edema, enc inc PO fluids, pt states she's only eating ice Denies any prodrome sx's  Plan: GBS today Valtrex 500mg  PO daily - prophylaxis  FKC and labor sx's rv'd

## 2011-12-13 LAB — STREP B DNA PROBE: GBSP: NEGATIVE

## 2011-12-17 ENCOUNTER — Ambulatory Visit (INDEPENDENT_AMBULATORY_CARE_PROVIDER_SITE_OTHER): Payer: Medicaid Other | Admitting: Obstetrics and Gynecology

## 2011-12-17 ENCOUNTER — Encounter: Payer: Self-pay | Admitting: Obstetrics and Gynecology

## 2011-12-17 VITALS — BP 100/56 | Wt 153.0 lb

## 2011-12-17 DIAGNOSIS — B009 Herpesviral infection, unspecified: Secondary | ICD-10-CM

## 2011-12-17 DIAGNOSIS — Z331 Pregnant state, incidental: Secondary | ICD-10-CM

## 2011-12-17 NOTE — Progress Notes (Signed)
Pt. Stated no issues today.  

## 2011-12-17 NOTE — Progress Notes (Signed)
No complaints today.Taking Valtrex. GBS negative

## 2011-12-25 ENCOUNTER — Encounter: Payer: Self-pay | Admitting: Obstetrics and Gynecology

## 2011-12-25 ENCOUNTER — Ambulatory Visit (INDEPENDENT_AMBULATORY_CARE_PROVIDER_SITE_OTHER): Payer: Medicaid Other | Admitting: Obstetrics and Gynecology

## 2011-12-25 VITALS — BP 104/70 | Wt 155.0 lb

## 2011-12-25 DIAGNOSIS — Z3400 Encounter for supervision of normal first pregnancy, unspecified trimester: Secondary | ICD-10-CM

## 2011-12-25 NOTE — Progress Notes (Signed)
No complaints Doing well Sched induction at 41wks FKCs and Labor Precautions RTO 1wk for ROB with NST

## 2011-12-29 ENCOUNTER — Encounter (HOSPITAL_COMMUNITY): Payer: Self-pay | Admitting: Anesthesiology

## 2011-12-29 ENCOUNTER — Encounter (HOSPITAL_COMMUNITY): Payer: Self-pay | Admitting: *Deleted

## 2011-12-29 ENCOUNTER — Encounter (HOSPITAL_COMMUNITY)
Admission: AD | Disposition: A | Discharge status: Home / Self Care | Payer: Self-pay | Source: Ambulatory Visit | Attending: Obstetrics and Gynecology

## 2011-12-29 ENCOUNTER — Inpatient Hospital Stay (HOSPITAL_COMMUNITY)
Admission: AD | Admit: 2011-12-29 | Discharge: 2012-01-01 | DRG: 766 | Disposition: A | Discharge status: Home / Self Care | Payer: Medicaid Other | Source: Ambulatory Visit | Attending: Obstetrics and Gynecology | Admitting: Obstetrics and Gynecology

## 2011-12-29 ENCOUNTER — Inpatient Hospital Stay (HOSPITAL_COMMUNITY): Payer: Medicaid Other | Admitting: Anesthesiology

## 2011-12-29 DIAGNOSIS — O43899 Other placental disorders, unspecified trimester: Secondary | ICD-10-CM

## 2011-12-29 DIAGNOSIS — O9903 Anemia complicating the puerperium: Secondary | ICD-10-CM | POA: Diagnosis not present

## 2011-12-29 DIAGNOSIS — D649 Anemia, unspecified: Secondary | ICD-10-CM | POA: Diagnosis not present

## 2011-12-29 DIAGNOSIS — O368990 Maternal care for other specified fetal problems, unspecified trimester, not applicable or unspecified: Secondary | ICD-10-CM

## 2011-12-29 DIAGNOSIS — Z8619 Personal history of other infectious and parasitic diseases: Secondary | ICD-10-CM | POA: Diagnosis not present

## 2011-12-29 DIAGNOSIS — Z8709 Personal history of other diseases of the respiratory system: Secondary | ICD-10-CM

## 2011-12-29 DIAGNOSIS — O368390 Maternal care for abnormalities of the fetal heart rate or rhythm, unspecified trimester, not applicable or unspecified: Secondary | ICD-10-CM | POA: Diagnosis not present

## 2011-12-29 LAB — CBC
HCT: 35.2 % — ABNORMAL LOW (ref 36.0–46.0)
MCH: 20.8 pg — ABNORMAL LOW (ref 26.0–34.0)
MCV: 66.4 fL — ABNORMAL LOW (ref 78.0–100.0)
Platelets: 401 10*3/uL — ABNORMAL HIGH (ref 150–400)
RBC: 5.3 MIL/uL — ABNORMAL HIGH (ref 3.87–5.11)

## 2011-12-29 SURGERY — Surgical Case
Anesthesia: Regional | Site: Abdomen | Wound class: Clean Contaminated

## 2011-12-29 MED ORDER — EPHEDRINE 5 MG/ML INJ
10.0000 mg | INTRAVENOUS | Status: DC | PRN
  Filled 2011-12-29: qty 2

## 2011-12-29 MED ORDER — FENTANYL 2.5 MCG/ML BUPIVACAINE 1/10 % EPIDURAL INFUSION (WH - ANES)
14.0000 mL/h | INTRAMUSCULAR | Status: DC
  Administered 2011-12-29: 14 mL/h via EPIDURAL
  Filled 2011-12-29: qty 60

## 2011-12-29 MED ORDER — DIPHENHYDRAMINE HCL 50 MG/ML IJ SOLN: 12.5000 mg | INTRAMUSCULAR | Status: DC | PRN

## 2011-12-29 MED ORDER — OXYTOCIN BOLUS FROM INFUSION
500.0000 mL | Freq: Once | INTRAVENOUS | Status: DC
  Filled 2011-12-29: qty 500

## 2011-12-29 MED ORDER — SODIUM CHLORIDE 0.9 % IJ SOLN: 3.0000 mL | INTRAMUSCULAR | Status: DC | PRN

## 2011-12-29 MED ORDER — NALOXONE HCL 0.4 MG/ML IJ SOLN: 0.4000 mg | INTRAMUSCULAR | Status: DC | PRN

## 2011-12-29 MED ORDER — EPHEDRINE 5 MG/ML INJ
10.0000 mg | INTRAVENOUS | Status: DC | PRN
  Filled 2011-12-29: qty 4
  Filled 2011-12-29: qty 2

## 2011-12-29 MED ORDER — SODIUM CHLORIDE 0.9 % IV SOLN
1.0000 ug/kg/h | INTRAVENOUS | Status: DC | PRN
  Filled 2011-12-29: qty 2.5

## 2011-12-29 MED ORDER — ONDANSETRON HCL 4 MG/2ML IJ SOLN: 4.0000 mg | Freq: Four times a day (QID) | INTRAMUSCULAR | Status: DC | PRN

## 2011-12-29 MED ORDER — LACTATED RINGERS IV SOLN: 500.0000 mL | Freq: Once | INTRAVENOUS | Status: DC

## 2011-12-29 MED ORDER — NALBUPHINE HCL 10 MG/ML IJ SOLN
5.0000 mg | INTRAMUSCULAR | Status: DC | PRN
  Administered 2011-12-29 – 2011-12-30 (×3): 10 mg via SUBCUTANEOUS
  Filled 2011-12-29 (×3): qty 1

## 2011-12-29 MED ORDER — PHENYLEPHRINE 40 MCG/ML (10ML) SYRINGE FOR IV PUSH (FOR BLOOD PRESSURE SUPPORT)
80.0000 ug | PREFILLED_SYRINGE | INTRAVENOUS | Status: DC | PRN
  Filled 2011-12-29: qty 2

## 2011-12-29 MED ORDER — ACETAMINOPHEN 325 MG PO TABS: 650.0000 mg | ORAL_TABLET | ORAL | Status: DC | PRN

## 2011-12-29 MED ORDER — SODIUM BICARBONATE 8.4 % IV SOLN
INTRAVENOUS | Status: AC
  Filled 2011-12-29: qty 50

## 2011-12-29 MED ORDER — SCOPOLAMINE 1 MG/3DAYS TD PT72
MEDICATED_PATCH | TRANSDERMAL | Status: AC
  Administered 2011-12-29: 1.5 mg via TRANSDERMAL
  Filled 2011-12-29: qty 1

## 2011-12-29 MED ORDER — LACTATED RINGERS IV SOLN
INTRAVENOUS | Status: DC
  Administered 2011-12-29 – 2011-12-30 (×2): via INTRAVENOUS

## 2011-12-29 MED ORDER — ONDANSETRON HCL 4 MG/2ML IJ SOLN
INTRAMUSCULAR | Status: AC
  Filled 2011-12-29: qty 2

## 2011-12-29 MED ORDER — LACTATED RINGERS IV SOLN
INTRAVENOUS | Status: DC
  Administered 2011-12-29 (×4): via INTRAVENOUS

## 2011-12-29 MED ORDER — LANOLIN HYDROUS EX OINT: 1.0000 "application " | TOPICAL_OINTMENT | CUTANEOUS | Status: DC | PRN

## 2011-12-29 MED ORDER — FENTANYL 2.5 MCG/ML BUPIVACAINE 1/10 % EPIDURAL INFUSION (WH - ANES)
INTRAMUSCULAR | Status: DC | PRN
  Administered 2011-12-29: 12 mL/h via EPIDURAL

## 2011-12-29 MED ORDER — DIBUCAINE 1 % RE OINT: 1.0000 "application " | TOPICAL_OINTMENT | RECTAL | Status: DC | PRN

## 2011-12-29 MED ORDER — KETOROLAC TROMETHAMINE 30 MG/ML IJ SOLN: 30.0000 mg | Freq: Four times a day (QID) | INTRAMUSCULAR | Status: AC | PRN

## 2011-12-29 MED ORDER — IBUPROFEN 600 MG PO TABS: 600.0000 mg | ORAL_TABLET | Freq: Four times a day (QID) | ORAL | Status: DC | PRN

## 2011-12-29 MED ORDER — OXYTOCIN 10 UNIT/ML IJ SOLN
INTRAMUSCULAR | Status: AC
  Filled 2011-12-29: qty 2

## 2011-12-29 MED ORDER — DIPHENHYDRAMINE HCL 50 MG/ML IJ SOLN: 25.0000 mg | INTRAMUSCULAR | Status: DC | PRN

## 2011-12-29 MED ORDER — LACTATED RINGERS IV SOLN: 500.0000 mL | INTRAVENOUS | Status: DC | PRN

## 2011-12-29 MED ORDER — PHENYLEPHRINE 40 MCG/ML (10ML) SYRINGE FOR IV PUSH (FOR BLOOD PRESSURE SUPPORT)
PREFILLED_SYRINGE | INTRAVENOUS | Status: AC
  Filled 2011-12-29: qty 5

## 2011-12-29 MED ORDER — SENNOSIDES-DOCUSATE SODIUM 8.6-50 MG PO TABS
2.0000 | ORAL_TABLET | Freq: Every day | ORAL | Status: DC
  Administered 2011-12-29 – 2011-12-31 (×3): 2 via ORAL

## 2011-12-29 MED ORDER — METOCLOPRAMIDE HCL 5 MG/ML IJ SOLN: 10.0000 mg | Freq: Three times a day (TID) | INTRAMUSCULAR | Status: DC | PRN

## 2011-12-29 MED ORDER — NALBUPHINE HCL 10 MG/ML IJ SOLN
5.0000 mg | INTRAMUSCULAR | Status: DC | PRN
  Filled 2011-12-29 (×3): qty 1

## 2011-12-29 MED ORDER — PHENYLEPHRINE HCL 10 MG/ML IJ SOLN
INTRAMUSCULAR | Status: DC | PRN
  Administered 2011-12-29 (×2): 80 ug via INTRAVENOUS
  Administered 2011-12-29: 40 ug via INTRAVENOUS

## 2011-12-29 MED ORDER — OXYCODONE-ACETAMINOPHEN 5-325 MG PO TABS: 1.0000 | ORAL_TABLET | ORAL | Status: DC | PRN

## 2011-12-29 MED ORDER — MORPHINE SULFATE 0.5 MG/ML IJ SOLN
INTRAMUSCULAR | Status: AC
  Filled 2011-12-29: qty 10

## 2011-12-29 MED ORDER — DIPHENHYDRAMINE HCL 25 MG PO CAPS: 25.0000 mg | ORAL_CAPSULE | Freq: Four times a day (QID) | ORAL | Status: DC | PRN

## 2011-12-29 MED ORDER — OXYTOCIN 20 UNITS IN LACTATED RINGERS INFUSION - SIMPLE: 125.0000 mL/h | INTRAVENOUS | Status: AC

## 2011-12-29 MED ORDER — LACTATED RINGERS IV SOLN
INTRAVENOUS | Status: DC | PRN
  Administered 2011-12-29 (×2): via INTRAVENOUS

## 2011-12-29 MED ORDER — ONDANSETRON HCL 4 MG/2ML IJ SOLN: 4.0000 mg | Freq: Three times a day (TID) | INTRAMUSCULAR | Status: DC | PRN

## 2011-12-29 MED ORDER — PRENATAL MULTIVITAMIN CH
1.0000 | ORAL_TABLET | Freq: Every day | ORAL | Status: DC
  Administered 2011-12-30 – 2012-01-01 (×3): 1 via ORAL
  Filled 2011-12-29 (×3): qty 1

## 2011-12-29 MED ORDER — DIPHENHYDRAMINE HCL 25 MG PO CAPS
25.0000 mg | ORAL_CAPSULE | ORAL | Status: DC | PRN
  Administered 2011-12-30: 25 mg via ORAL
  Filled 2011-12-29: qty 1

## 2011-12-29 MED ORDER — CEFAZOLIN SODIUM 1-5 GM-% IV SOLN
INTRAVENOUS | Status: DC | PRN
  Administered 2011-12-29: 2 g via INTRAVENOUS

## 2011-12-29 MED ORDER — SCOPOLAMINE 1 MG/3DAYS TD PT72
1.0000 | MEDICATED_PATCH | Freq: Once | TRANSDERMAL | Status: AC
  Administered 2011-12-29: 1.5 mg via TRANSDERMAL

## 2011-12-29 MED ORDER — ONDANSETRON HCL 4 MG/2ML IJ SOLN
INTRAMUSCULAR | Status: DC | PRN
  Administered 2011-12-29: 4 mg via INTRAVENOUS

## 2011-12-29 MED ORDER — LIDOCAINE-EPINEPHRINE (PF) 2 %-1:200000 IJ SOLN
INTRAMUSCULAR | Status: AC
  Filled 2011-12-29: qty 20

## 2011-12-29 MED ORDER — SIMETHICONE 80 MG PO CHEW: 80.0000 mg | CHEWABLE_TABLET | ORAL | Status: DC | PRN

## 2011-12-29 MED ORDER — OXYCODONE-ACETAMINOPHEN 5-325 MG PO TABS
1.0000 | ORAL_TABLET | ORAL | Status: DC | PRN
  Administered 2011-12-30 – 2011-12-31 (×4): 1 via ORAL
  Administered 2012-01-01: 2 via ORAL
  Filled 2011-12-29 (×2): qty 1
  Filled 2011-12-29: qty 2
  Filled 2011-12-29 (×2): qty 1

## 2011-12-29 MED ORDER — FLEET ENEMA 7-19 GM/118ML RE ENEM: 1.0000 | ENEMA | RECTAL | Status: DC | PRN

## 2011-12-29 MED ORDER — ONDANSETRON HCL 4 MG PO TABS: 4.0000 mg | ORAL_TABLET | ORAL | Status: DC | PRN

## 2011-12-29 MED ORDER — MEPERIDINE HCL 25 MG/ML IJ SOLN: 6.2500 mg | INTRAMUSCULAR | Status: DC | PRN

## 2011-12-29 MED ORDER — MENTHOL 3 MG MT LOZG: 1.0000 | LOZENGE | OROMUCOSAL | Status: DC | PRN

## 2011-12-29 MED ORDER — PHENYLEPHRINE 40 MCG/ML (10ML) SYRINGE FOR IV PUSH (FOR BLOOD PRESSURE SUPPORT)
80.0000 ug | PREFILLED_SYRINGE | INTRAVENOUS | Status: DC | PRN
  Filled 2011-12-29: qty 5
  Filled 2011-12-29: qty 2

## 2011-12-29 MED ORDER — OXYTOCIN 20 UNITS IN LACTATED RINGERS INFUSION - SIMPLE: 125.0000 mL/h | Freq: Once | INTRAVENOUS | Status: DC

## 2011-12-29 MED ORDER — CITRIC ACID-SODIUM CITRATE 334-500 MG/5ML PO SOLN
30.0000 mL | ORAL | Status: DC | PRN
  Administered 2011-12-29: 30 mL via ORAL
  Filled 2011-12-29: qty 15

## 2011-12-29 MED ORDER — OXYTOCIN 10 UNIT/ML IJ SOLN
INTRAMUSCULAR | Status: DC | PRN
  Administered 2011-12-29: 20 [IU] via INTRAMUSCULAR

## 2011-12-29 MED ORDER — FENTANYL CITRATE 0.05 MG/ML IJ SOLN: 25.0000 ug | INTRAMUSCULAR | Status: DC | PRN

## 2011-12-29 MED ORDER — DIPHENHYDRAMINE HCL 50 MG/ML IJ SOLN
12.5000 mg | INTRAMUSCULAR | Status: DC | PRN
  Administered 2011-12-29: 12.5 mg via INTRAVENOUS
  Filled 2011-12-29: qty 1

## 2011-12-29 MED ORDER — IBUPROFEN 600 MG PO TABS
600.0000 mg | ORAL_TABLET | Freq: Four times a day (QID) | ORAL | Status: DC | PRN
  Filled 2011-12-29 (×7): qty 1

## 2011-12-29 MED ORDER — LACTATED RINGERS IV SOLN
INTRAVENOUS | Status: DC
  Administered 2011-12-29: 12:00:00 via INTRAUTERINE

## 2011-12-29 MED ORDER — CEFAZOLIN SODIUM 1-5 GM-% IV SOLN
INTRAVENOUS | Status: AC
  Filled 2011-12-29: qty 100

## 2011-12-29 MED ORDER — ONDANSETRON HCL 4 MG/2ML IJ SOLN: 4.0000 mg | INTRAMUSCULAR | Status: DC | PRN

## 2011-12-29 MED ORDER — OXYTOCIN 20 UNITS IN LACTATED RINGERS INFUSION - SIMPLE: 1.0000 m[IU]/min | INTRAVENOUS | Status: DC

## 2011-12-29 MED ORDER — SIMETHICONE 80 MG PO CHEW
80.0000 mg | CHEWABLE_TABLET | Freq: Three times a day (TID) | ORAL | Status: DC
  Administered 2011-12-29 – 2012-01-01 (×10): 80 mg via ORAL

## 2011-12-29 MED ORDER — WITCH HAZEL-GLYCERIN EX PADS: 1.0000 "application " | MEDICATED_PAD | CUTANEOUS | Status: DC | PRN

## 2011-12-29 MED ORDER — ZOLPIDEM TARTRATE 5 MG PO TABS: 5.0000 mg | ORAL_TABLET | Freq: Every evening | ORAL | Status: DC | PRN

## 2011-12-29 MED ORDER — TERBUTALINE SULFATE 1 MG/ML IJ SOLN: 0.2500 mg | Freq: Once | INTRAMUSCULAR | Status: DC | PRN

## 2011-12-29 MED ORDER — IBUPROFEN 600 MG PO TABS
600.0000 mg | ORAL_TABLET | Freq: Four times a day (QID) | ORAL | Status: DC
  Administered 2011-12-29 – 2012-01-01 (×10): 600 mg via ORAL
  Filled 2011-12-29 (×3): qty 1

## 2011-12-29 MED ORDER — MORPHINE SULFATE (PF) 0.5 MG/ML IJ SOLN
INTRAMUSCULAR | Status: DC | PRN
  Administered 2011-12-29: 1 mg via INTRAVENOUS

## 2011-12-29 MED ORDER — LIDOCAINE HCL (PF) 1 % IJ SOLN
30.0000 mL | INTRAMUSCULAR | Status: DC | PRN
  Filled 2011-12-29: qty 30

## 2011-12-29 MED ORDER — SODIUM BICARBONATE 8.4 % IV SOLN
INTRAVENOUS | Status: DC | PRN
  Administered 2011-12-29: 4 mL via EPIDURAL

## 2011-12-29 MED ORDER — KETOROLAC TROMETHAMINE 60 MG/2ML IM SOLN
60.0000 mg | Freq: Once | INTRAMUSCULAR | Status: AC | PRN
  Administered 2011-12-29: 60 mg via INTRAMUSCULAR

## 2011-12-29 MED ORDER — TETANUS-DIPHTH-ACELL PERTUSSIS 5-2.5-18.5 LF-MCG/0.5 IM SUSP
0.5000 mL | Freq: Once | INTRAMUSCULAR | Status: AC
  Administered 2011-12-30: 0.5 mL via INTRAMUSCULAR
  Filled 2011-12-29: qty 0.5

## 2011-12-29 MED ORDER — KETOROLAC TROMETHAMINE 60 MG/2ML IM SOLN
INTRAMUSCULAR | Status: AC
  Administered 2011-12-29: 60 mg via INTRAMUSCULAR
  Filled 2011-12-29: qty 2

## 2011-12-29 MED ORDER — MORPHINE SULFATE (PF) 0.5 MG/ML IJ SOLN
INTRAMUSCULAR | Status: DC | PRN
  Administered 2011-12-29: 4 mg via EPIDURAL

## 2011-12-29 SURGICAL SUPPLY — 38 items
APL SKNCLS STERI-STRIP NONHPOA (GAUZE/BANDAGES/DRESSINGS) ×1
BENZOIN TINCTURE PRP APPL 2/3 (GAUZE/BANDAGES/DRESSINGS) ×2 IMPLANT
CHLORAPREP W/TINT 26ML (MISCELLANEOUS) ×2 IMPLANT
CLOTH BEACON ORANGE TIMEOUT ST (SAFETY) ×2 IMPLANT
CONTAINER PREFILL 10% NBF 15ML (MISCELLANEOUS) IMPLANT
ELECT REM PT RETURN 9FT ADLT (ELECTROSURGICAL) ×2
ELECTRODE REM PT RTRN 9FT ADLT (ELECTROSURGICAL) ×1 IMPLANT
EXTRACTOR VACUUM M CUP 4 TUBE (SUCTIONS) IMPLANT
GLOVE BIO SURGEON STRL SZ7.5 (GLOVE) ×4 IMPLANT
GLOVE BIOGEL PI IND STRL 7.0 (GLOVE) IMPLANT
GLOVE BIOGEL PI IND STRL 7.5 (GLOVE) ×1 IMPLANT
GLOVE BIOGEL PI INDICATOR 7.0 (GLOVE) ×1
GLOVE BIOGEL PI INDICATOR 7.5 (GLOVE) ×1
GLOVE ECLIPSE 6.5 STRL STRAW (GLOVE) ×1 IMPLANT
GOWN PREVENTION PLUS LG XLONG (DISPOSABLE) ×6 IMPLANT
KIT ABG SYR 3ML LUER SLIP (SYRINGE) IMPLANT
NDL HYPO 25X5/8 SAFETYGLIDE (NEEDLE) IMPLANT
NEEDLE HYPO 22GX1.5 SAFETY (NEEDLE) IMPLANT
NEEDLE HYPO 25X5/8 SAFETYGLIDE (NEEDLE) IMPLANT
NS IRRIG 1000ML POUR BTL (IV SOLUTION) ×2 IMPLANT
PACK C SECTION WH (CUSTOM PROCEDURE TRAY) ×2 IMPLANT
PAD ABD 7.5X8 STRL (GAUZE/BANDAGES/DRESSINGS) ×1 IMPLANT
RETRACTOR WND ALEXIS 25 LRG (MISCELLANEOUS) ×1 IMPLANT
RTRCTR WOUND ALEXIS 25CM LRG (MISCELLANEOUS) ×2
SLEEVE SCD COMPRESS KNEE MED (MISCELLANEOUS) IMPLANT
STRIP CLOSURE SKIN 1/2X4 (GAUZE/BANDAGES/DRESSINGS) ×2 IMPLANT
SUT CHROMIC 2 0 CT 1 (SUTURE) ×2 IMPLANT
SUT MNCRL AB 3-0 PS2 27 (SUTURE) ×2 IMPLANT
SUT PLAIN 0 NONE (SUTURE) IMPLANT
SUT PLAIN 2 0 XLH (SUTURE) ×2 IMPLANT
SUT VIC AB 0 CT1 36 (SUTURE) ×2 IMPLANT
SUT VIC AB 0 CTX 36 (SUTURE) ×6
SUT VIC AB 0 CTX36XBRD ANBCTRL (SUTURE) ×3 IMPLANT
SYR CONTROL 10ML LL (SYRINGE) IMPLANT
TAPE CLOTH SURG 4X10 WHT LF (GAUZE/BANDAGES/DRESSINGS) ×1 IMPLANT
TOWEL OR 17X24 6PK STRL BLUE (TOWEL DISPOSABLE) ×4 IMPLANT
TRAY FOLEY CATH 14FR (SET/KITS/TRAYS/PACK) ×1 IMPLANT
WATER STERILE IRR 1000ML POUR (IV SOLUTION) ×2 IMPLANT

## 2011-12-29 NOTE — Progress Notes (Signed)
Patient ID: Kara Fernandez, female   DOB: Sep 03, 1990, 21 y.o.   MRN: [id number] .Subjective:  Comfortable with epidural, pt's mother and cousin at bs  Objective: BP 100/52  Pulse 72  Temp(Src) 98.5 F (36.9 C) (Oral)  Resp 16  Ht 5\' 1"  (1.549 m)  Wt 154 lb 6 oz (70.024 kg)  BMI 29.17 kg/m2  SpO2 100%  LMP 03/22/2011   FHT:  FHR: 130 bpm, variability: moderate,  accelerations:  Present,  decelerations:  Present occ variables, resolve with position changes UC:   irregular, every 2-4 minutes, some coupling SVE:   Dilation: 6 Effacement (%): 100 Station: -1 Exam by:: S. Ommie Degeorge CNM  AROM - mod mec IUPC placed without difficulty  Assessment / Plan: Spontaneous labor, progressing normally GBS neg   Fetal Wellbeing:  Category II Pain Control:  Epidural  Dr Su Hilt updated  Sanda Klein M 12/29/2011, 11:05 AM

## 2011-12-29 NOTE — H&P (Signed)
Kara Fernandez is a 21 y.o. female presenting for Onset of Ctx most of the night and stronger since 0600. Denies LOF, VB, reports GFM. Denies any sx's of HSV.   HPI: pt began [facility] at 13wks. EDD based on LMP =12/27/11 . 1st trimester screen was normal with S=D. She then had anatomy US with EDC 8 days later. Working EDD remained 5/4. She then had repeat scan for unseen anatomy and was normal. Pt began valtrex prophylaxis at 34wks and GBS was neg at 36 wks, Pregnancy was otherwise normal .    Maternal Medical History:  Reason for admission: Reason for admission: contractions.  Contractions: Onset was 6-12 hours ago.   Frequency: regular.   Duration is approximately 60 seconds.   Perceived severity is moderate.    Fetal activity: Perceived fetal activity is normal.   Last perceived fetal movement was within the past hour.    Prenatal complications: no prenatal complications   OB History    Grav Para Term Preterm Abortions TAB SAB Ect Mult Living   2    1 1          Past Medical History  Diagnosis Date  . Chlamydia infection     POSITIVE 10/06,8/07,7/09,11/11 ;at age 40  . ASCUS on Pap smear     C&B 10/09 W/LGSIL  . HSV (herpes simplex virus) infection   . 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 Gonorrhea     POSITIVE 8/06, 10/06; at age 37  . GC (gonococcus infection)   . Trichomonal vulvovaginitis   . Ovarian cyst   . UTI (lower urinary tract infection)   . 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 H/O varicella   . History of high blood pressure   . Yeast infection   . H/O bacterial infection     frequently  . Abnormal Pap smear     completed  at age 76  . Asthma     as a child   . Angina    Past Surgical History  Procedure Date  . Dilation and curettage of uterus     abortion   Pt has had normal menses, and hx of Depo Provera for contracpetion   Family History: family history includes Alcohol abuse in  her maternal uncle and paternal uncle; Hypertension in her father, maternal aunt, maternal grandfather, maternal grandmother, maternal uncle, and mother; and Stroke in her maternal grandmother. Social History:  reports that she has quit smoking. She has never used smokeless tobacco. She reports that she uses illicit drugs (Marijuana). She reports that she does not drink alcohol. Pt is SBF, unemployed.   Review of Systems  All other systems reviewed and are negative.    Dilation: 4 Effacement (%): 100 Station: -1 Exam by:: E. Cone RNC Blood pressure 110/60, pulse 89, temperature 97.1 F (36.2 C), temperature source Oral, resp. rate 16, height 5\' 1"  (1.549 m), weight 154 lb 6 oz (70.024 kg), last menstrual period 03/22/2011, SpO2 100.00%. Maternal Exam:  Uterine Assessment: Contraction strength is moderate.  Contraction duration is 60 seconds. Contraction frequency is regular.   Abdomen: Fundal height is aqa.   Estimated fetal weight is 7.   Fetal presentation: vertex  Introitus: Normal vulva. Normal vagina.  Spec exam, per H.Neese, PA = clear, no evidence of HSV lesions  Pelvis: adequate for delivery.   Cervix: Cervix evaluated by sterile  speculum exam and digital exam.     Fetal Exam Fetal Monitor Review: Mode: ultrasound.   Baseline rate: 145.  Variability: moderate (6-25 bpm).   Pattern: accelerations present and variable decelerations.    Fetal State Assessment: Category I - tracings are normal.     Physical Exam  Nursing note and vitals reviewed. Constitutional: She is oriented to person, place, and time. She appears well-developed and well-nourished.  HENT:  Head: Normocephalic.  Neck: Normal range of motion.  Cardiovascular: Normal rate, regular rhythm and normal heart sounds.   Respiratory: Effort normal and breath sounds normal.  GI: Soft. Bowel sounds are normal.       gravid  Genitourinary: Vagina normal and uterus normal.  Musculoskeletal: Normal range of  motion.  Neurological: She is alert and oriented to person, place, and time.  Skin: Skin is warm and dry.  Psychiatric: She has a normal mood and affect. Her behavior is normal.    Prenatal labs: ABO, Rh:  B POS Antibody: Negative (11/26 0000) Rubella: Immune (11/26 0000) RPR: Nonreactive (11/26 0000)  HBsAg: Negative (11/26 0000)  HIV: Non-reactive (11/26 0000)  GBS: NEGATIVE (04/18 1435)  1st trimester screen normal  1hr gtt=101 Hgb=10.8 RPR= NR Assessment/Plan: Early/active labor GBS neg FHR reassuring Hx HSV - spec exam clear   Briahnna Harries M 12/29/2011, 10:01 AM

## 2011-12-29 NOTE — Progress Notes (Signed)
Eustace Pen, CNM notified of pt presenting for labor check. notfied of VE and ctx pattern.  Notified of variable deceleration.  Admit orders received.  Oncoming CNM to do spec exam.

## 2011-12-29 NOTE — Progress Notes (Signed)
Called by RN regarding FHR pattern FHR with prolonged variable to 60's over 6-7 min with return BL, then had 2 late decels, now is BL 130 with mod variability, + accels FSE was placed by RN VE per RN was no change 6cm MVU's 65-110-145 Will do amnioinfusion And if FHR remains reassuring will consider augment with pitcoin

## 2011-12-29 NOTE — Anesthesia Preprocedure Evaluation (Signed)

## 2011-12-29 NOTE — Anesthesia Postprocedure Evaluation (Signed)
  Anesthesia Post-op Note  Patient: Kara Fernandez  Procedure(s) Performed: Procedure(s) (LRB): CESAREAN SECTION (N/A)   Patient is awake, responsive, moving her legs, and has signs of resolution of her numbness. Pain and nausea are reasonably well controlled. Vital signs are stable and clinically acceptable. Oxygen saturation is clinically acceptable. There are no apparent anesthetic complications at this time. Patient is ready for discharge.

## 2011-12-29 NOTE — MAU Note (Signed)
RN called to eval in midlevel provider can eval for HSV active lesions. OKay per [place], CNM

## 2011-12-29 NOTE — Progress Notes (Signed)
Patient ID: TYERA HANSLEY, female   DOB: May 21, 1991, 21 y.o.   MRN: [id number] .Subjective: Remains comfortable with epidural, currently in slight trendelenburg position, secondary to prolonged decel,    Objective: BP 121/73  Pulse 71  Temp(Src) 98.5 F (36.9 C) (Oral)  Resp 16  Ht 5\' 1"  (1.549 m)  Wt 154 lb 6 oz (70.024 kg)  BMI 29.17 kg/m2  SpO2 100%  LMP 03/22/2011   FHT:  FHR: 130 bpm, variability: moderate,  accelerations:  Present,  decelerations:  Present another prolonged decel to 60's over about , returned to BL, now late decel UC:   irregular, every 2-5 minutes SVE:   Dilation: 6 Effacement (%): 100 Station: -1 Exam by:: E. Cone RNC   Assessment / Plan: Protracted active phase, with continued prolonged decels GBS neg  rv'd R/B of C/S with pt, including but not limited to: anesthesia complications, bleeding, infection, damage to internal organs Pt understands and agrees to proceed  Fetal Wellbeing:  Category III Pain Control:  Epidural  D/W Dr Su Hilt, will proceed with C/S  Shelbe Haglund M 12/29/2011, 12:16 PM

## 2011-12-29 NOTE — Anesthesia Procedure Notes (Signed)

## 2011-12-29 NOTE — Transfer of Care (Signed)
Immediate Anesthesia Transfer of Care Note  Patient: Kara Fernandez  Procedure(s) Performed: Procedure(s) (LRB): CESAREAN SECTION (N/A)  Patient Location: PACU  Anesthesia Type: Epidural  Level of Consciousness: awake, alert  and oriented  Airway & Oxygen Therapy: Patient Spontanous Breathing  Post-op Assessment: Report given to PACU RN and Post -op Vital signs reviewed and stable  Post vital signs: Reviewed and stable  Complications: No apparent anesthesia complications

## 2011-12-29 NOTE — Op Note (Addendum)
Cesarean Section Procedure Note  Indications: NRFHT and FTP  Pre-operative Diagnosis: non reassuring fetal heart rate   Post-operative Diagnosis: non reassuring fetal heart rate  Procedure: CESAREAN SECTION  Surgeon: Osborn Coho, MD    Assistants: Sanda Klein, CNM  Anesthesia: Regional  Anesthesiologist: Jiles Garter, MD   Procedure Details  The patient was taken to the operating room secondary to NRFHT and FTP after the risks, benefits, complications, treatment options, and expected outcomes were discussed with the patient.  The patient concurred with the proposed plan, giving informed consent which was signed and witnessed. The patient was taken to Operating Room 1, identified as Kara Fernandez and the procedure verified as C-Section Delivery. A Time Out was held and the above information confirmed.  After induction of anesthesia by obtaining a surgical level via the epidural, the patient was prepped and draped in the usual sterile manner. A Pfannenstiel skin incision was made and carried down through the subcutaneous tissue to the underlying layer of fascia.  The fascia was incised bilaterally and extended transversely bilaterally with the Mayo scissors. Kocher clamps were placed on the inferior aspect of the fascial incision and the underlying rectus muscle was separated from the fascia. The same was done on the superior aspect of the fascial incision.  The peritoneum was identified, entered bluntly and extended manually. The utero-vesical peritoneal reflection was incised transversely and the bladder flap was bluntly freed from the lower uterine segment. A low transverse uterine incision was made with the scalpel and extended bilaterally with the bandage scissors.  The infant was delivered in vertex position without difficulty in LOP presentation.  After the umbilical cord was clamped and cut, the infant was handed to the awaiting pediatricians.  Cord blood was obtained  for evaluation.  The placenta was removed intact and appeared to be within normal limits. The uterus was cleared of all clots and debris. The uterine incision was closed with running interlocking sutures of 0 Vicryl and a second imbricating layer was performed as well.   Bilateral tubes and ovaries appeared to be within normal limits.  Good hemostasis was noted.  Copious irrigation was performed until clear.  The peritoneum was repaired with 2-0 chromic via a running suture.  The fascia was reapproximated with a running suture of 0 Vicryl. The subcutaneous tissue was reapproximated with 4 interrupted sutures of 2-0 plain.  The skin was reapproximated with a subcuticular suture of 3-0 monocryl.  Steristrips were applied.  Instrument, sponge, and needle counts were correct prior to abdominal closure and at the conclusion of the case.  The patient was awaiting transfer to the recovery room in good condition.  Findings: Live female infant with Apgars 8 at one minute and 9 at five minutes.  Normal appearing bilateral ovaries and fallopian tubes were noted.  Meconium staining and cord down near shoulder and wrapped several times around the infant's body.  Estimated Blood Loss:          Drains: foley to gravity 200cc         Total IV Fluids:         Specimens to Pathology: Placenta         Complications:  None; patient tolerated the procedure well.         Disposition: PACU - hemodynamically stable.         Condition: stable  Attending Attestation: I performed the procedure.

## 2011-12-29 NOTE — MAU Note (Signed)
PT SAYS SHE HAS BEEN HURTING SINCE  2PM YESTERDAY.  WORSE AT  10PM  THEN  WORSE AT 0600.   VE IN OFFICE - 1-2 CM. INDUCTION  DATE FOR-5-9.

## 2011-12-29 NOTE — OR Nursing (Signed)
Fetal heart rate 135 in room

## 2011-12-30 ENCOUNTER — Encounter (HOSPITAL_COMMUNITY): Payer: Self-pay | Admitting: Obstetrics and Gynecology

## 2011-12-30 DIAGNOSIS — O368390 Maternal care for abnormalities of the fetal heart rate or rhythm, unspecified trimester, not applicable or unspecified: Secondary | ICD-10-CM | POA: Diagnosis not present

## 2011-12-30 LAB — CBC
HCT: 26.4 % — ABNORMAL LOW (ref 36.0–46.0)
MCV: 66.8 fL — ABNORMAL LOW (ref 78.0–100.0)
RDW: 17.4 % — ABNORMAL HIGH (ref 11.5–15.5)
WBC: 13 10*3/uL — ABNORMAL HIGH (ref 4.0–10.5)

## 2011-12-30 NOTE — Progress Notes (Addendum)
Subjective: Postpartum Day 1: Cesarean Delivery Patient reports tolerating PO.    Objective: Vital signs in last 24 hours: Temp:  [97.6 F (36.4 C)-99.1 F (37.3 C)] 98.1 F (36.7 C) (05/07 0825) Pulse Rate:  [71-134] 95  (05/07 0825) Resp:  [16-22] 16  (05/07 0825) BP: (96-126)/(52-93) 105/62 mmHg (05/07 0825) SpO2:  [100 %] 100 % (05/07 0825)  Physical Exam:  General: alert and cooperative Lochia: appropriate Uterine Fundus: firm Incision: Dressing clean and dry DVT Evaluation: No evidence of DVT seen on physical exam.   Basename 12/30/11 0545 12/29/11 0704  HGB 8.3* 11.0*  HCT 26.4* 35.2*    Assessment/Plan: Status post Cesarean section. Doing well postoperatively.  Continue current care. Anemia  The patient will ambulate today. We will check orthostatics.  Pebbles Zeiders V 12/30/2011, 9:08 AM

## 2011-12-30 NOTE — Progress Notes (Signed)
UR chart review completed.  

## 2011-12-31 MED ORDER — MEDROXYPROGESTERONE ACETATE 150 MG/ML IM SUSP
150.0000 mg | Freq: Once | INTRAMUSCULAR | Status: DC
  Filled 2011-12-31: qty 1

## 2011-12-31 NOTE — Progress Notes (Addendum)
Subjective: Postpartum Day 2: Cesarean Delivery due to non-reassuring FHR Patient reports no problems voiding.   Doing well, up ad lib without dizziness or syncope.  Undecided regarding birth control.  Breastfeeding.  Objective: Vital signs in last 24 hours: Temp:  [97.5 F (36.4 C)-97.9 F (36.6 C)] 97.5 F (36.4 C) (05/08 0535) Pulse Rate:  [72-111] 72  (05/08 0535) Resp:  [18] 18  (05/08 0535) BP: (94-129)/(55-69) 129/56 mmHg (05/08 0535) SpO2:  [100 %] 100 % (05/07 1220)  Physical Exam:  General: alert Lochia: appropriate Uterine Fundus: firm Incision: healing well DVT Evaluation: No evidence of DVT seen on physical exam. Negative Homan's sign.   Basename 12/30/11 0545 12/29/11 0704  HGB 8.3* 11.0*  HCT 26.4* 35.2*  Orthostatics stable on 12/30/11.  Assessment/Plan: Status post Cesarean section. Doing well postoperatively.  Continue current care.   Nigel Bridgeman 12/31/2011, 9:14 AM   Pt has decided on Depo Provera for birth control.  Order written for administration on 01/01/12 prior to discharge.

## 2012-01-01 ENCOUNTER — Encounter: Payer: Medicaid Other | Admitting: Obstetrics and Gynecology

## 2012-01-01 MED ORDER — OXYCODONE-ACETAMINOPHEN 5-325 MG PO TABS: 1.0000 | ORAL_TABLET | ORAL | Status: AC | PRN

## 2012-01-01 MED ORDER — IBUPROFEN 600 MG PO TABS: 600.0000 mg | ORAL_TABLET | Freq: Four times a day (QID) | ORAL | Status: AC | PRN

## 2012-01-01 MED ORDER — FERRALET 90 90-1 MG PO TABS: 1.0000 | ORAL_TABLET | Freq: Every day | ORAL | Status: DC

## 2012-01-01 NOTE — Discharge Instructions (Signed)
Cesarean Delivery Care After Refer to this sheet in the next few weeks. These instructions provide you with information on caring for yourself after your procedure. Your caregiver may also give you more specific instructions. Your treatment has been planned according to current medical practices, but problems sometimes occur. Call your caregiver if you have any problems or questions after your procedure. HOME CARE INSTRUCTIONS Healing will take time. You will have discomfort, tenderness, swelling, and bruising at the surgery site for a couple of weeks. This is normal and will get better as time goes on. Activity  Rest as much as possible the first 2 weeks.   When possible, have someone help you with your household activities and your baby for 2 to 3 weeks.   Limit your housework and social activity. Increase your activity gradually as your strength returns.   Do not climb stairs more than 2 to 3 times a day.   Do not lift anything heavier than your baby.   Follow your caregiver's instructions about driving a car.   Exercise only as directed by your caregiver.  Nutrition  You may return to your usual diet. Eat a well-balanced diet.   Drink enough fluid to keep your urine clear or pale yellow.   Keep taking your prenatal or multivitamins.   Do not drink alcohol until your caregiver says it is okay.  Elimination You should return to your usual bowel function. If you develop constipation, ask your caregiver about taking a mild laxative that will help you go to the bathroom. Bran foods and fluids help with constipation. Gradually add fruit, vegetables, and bran to your diet.  Hygiene  You may shower, wash your hair, and take tub baths unless your caregiver tells you otherwise.   Continue perineal care until your vaginal bleeding and discharge stops.   Do not douche or use tampons until your caregiver says it is okay.  Fever If you feel feverish or have shaking chills, take your  temperature. The fever may indicate infection. Infections can be treated with antibiotic medicine. Pain Control and Medicine  Only take over-the-counter or prescription medicine as directed by your caregiver. Do not take aspirin. It can cause bleeding.   Do not drive when taking pain medicine.   Talk to your caregiver about restarting or adjusting your normal medicines.  Incision Care  Clean your cut (incision) gently with soap and water, then pat dry.   If your caregiver says it is okay, leave the incision without a bandage (dressing) unless it is draining fluid or irritated.   If you have small adhesive strips across the incision and they do not fall off within 7 days, carefully peel them off.   Check the incision daily for increased redness, drainage, swelling, or separation of skin.   Hug a pillow when coughing or sneezing. This helps to relieve pain.  Vaginal Care You may have a vaginal discharge or bleeding for up to 6 weeks. If the vaginal discharge becomes bright red, bad smelling, heavy in amount, has blood clots, or if you have burning or frequent urination, call your caregiver.If your bleeding slows down and then gets heavier, your body is telling you to slow down and relax more. Sexual Intercourse  Check with your caregiver before resuming sexual activity. Often, after 4 to 6 weeks, if you feel good and are well rested, sexual activity may be resumed. Avoid positions that strain the incision site.   You can become pregnant before you have a period.   If you decide to have sexual intercourse, use birth control if you do not want to become pregnant right away.  Health Practices  Keep all your postpartum appointments as recommended by your caregiver. Generally, your caregiver will want to see you in 2 to 3 weeks.   Continue with your yearly pelvic exams.   Continue monthly self-breast exams and yearly physical exams with a Pap test.  Breast Care  If you are not  breastfeeding and your breasts become tender, hard, or leak milk, you may wear a tight-fitting bra and apply ice to your breasts.   If you are breastfeeding, wear a good support bra.   Call your caregiver if you have breast pain, flu-like symptoms, fever, or hardness and reddening of your breasts.  Postpartum Blues You may have a period of low spirits or "blues" after your baby is born. Discuss your feelings with your partner, family, and friends. This may be caused by the changing hormone levels in your body. You may want to contact your caregiver if this is worrisome. Miscellaneous  Limit wearing support panties or control-top hose.   If you breastfeed, you may not have a period for several months or longer. This is normal for the nursing mother. If you do not menstruate within 6 weeks after you stop breastfeeding, see your caregiver.   If you are not breastfeeding, you can expect to menstruate within 6 to 10 weeks after birth. If you have not started by the 11th week, check with your caregiver.  SEEK MEDICAL CARE IF:   There is swelling, redness, or increasing pain in the wound area.   You have pus coming from the wound.   You notice a bad smell from the wound or surgical dressing.   You have pain, redness, and swelling from the intravenous (IV) site.   Your wound breaks open (the edges are not staying together).   You feel dizzy or feel like fainting.   You develop pain or bleeding when you urinate.   You develop diarrhea.   You develop nausea and vomiting.   You develop abnormal vaginal discharge.   You develop a rash.   You have any type of abnormal reaction or develop an allergy to your medicine.   Your pain is not relieved by your medicine or becomes worse.   Your temperature is 101 F (38.3 C), or is 100.4 F (38 C) taken 2 times in a 4 hour period.  SEEK IMMEDIATE MEDICAL CARE:  You develop a temperature of 102 F (38.9 C) or higher.   You develop abdominal  pain.   You develop chest pain.   You develop shortness of breath.   You faint.   You develop pain, swelling, or redness of your leg.   You develop heavy vaginal bleeding with or without blood clots.  Document Released: 05/03/2002 Document Revised: 07/31/2011 Document Reviewed: 11/06/2010 [facility] Patient Information 2012 [place], [place]. Breastfeeding Challenges Breastfeeding is often the best way to feed your baby. Challenges may discourage you from breastfeeding. But solutions can usually be found to help you. Some babies have conditions that may interfere with or make breastfeeding more difficult. But, in all of the following cases, breastfeeding is still best for a baby's health.  ADVANTAGES OF BREASTFEEDING  Breastfed babies tend to be healthier and less affected by disease.   Breastfed babies may have better brain development and be less likely to be overweight than formulafed babies.   Breastfeeding also benefits the mother. It will  give you time to be close to your baby and helps create a strong bond. It also:   Delays the return of your periods.   Stimulates your uterus to contract back to normal.   Helps you lose some of the weight you gained during pregnancy.   Breastfeeding is also cheaper than formula feeding. It also does not require mixing formula, heating bottles, or washing extra dishes.   Breastfeeding mothers have a lower risk of developing breast cancer.   Breastfeeding should be encouraged in women with gestational diabetes and diabetes type I and type II.  BREASTFEEDING CHALLENGES  Breastfeeding involves taking the time to get to know your baby's patterns and responding to his or her cues. Once breastfeeding is well established, feedings usually become more regular and more widely spaced. Some mothers do not nurse their babies because they come across problems early on. If at all possible, begin breastfeeding your baby within an hour after delivery. The  first milk you produce is called colostrum. It is packed with nutrients and disease-fighting substances. These will help nourish and protect your baby against infections as he or she grows up.   Some babies are unable to breastfeed because of premature birth and small size along with weakness and difficulty sucking. Sometimes with birth defects of the mouth (cleft lip or cleft palate) the mother may be able to pump breastmilk to give to her baby. Some digestive problems (breast milk jaundice, galactosemia) may be reasons not to nurse. See a lactation consultant if you have a breast infection or breast abscess, breast cancer or other cancer, previous surgery or radiation treatment, or inadequate milk supply (uncommon).  SOME MOTHERS ARE ADVISED NOT TO BREASTFEED DUE TO HEALTH PROBLEMS SUCH AS:  Serious illnesses.   Severe malnutrition.   Undergoing radiation therapy.   Taking psychiatric medication.   Active herpes lesions on the breast.   Chickenpox or shingles.   Active, untreated tuberculosis.   HIV (human immunodeficiency virus) infection.   Drug or alcohol addiction.   Undergoing radioactive iodine therapy.   Leukemia human cell Type I or II.  BREASTFEEDING SHOULD NOT BE PAINFUL  It is natural for minor problems to arise in first time breastfeeding. Problems you may have and some solutions are as follows:   Nipple soreness may be caused by:   Improper position of baby.   Improper feeding techniques.   Improper nipple care.   For many women, there is no identified cause. A simple change in your baby's position while feeding may relieve nipple soreness. Some breastfeeding mothers report nipple soreness only during the initial adjustment period.   If there is tenderness at first, it should gradually go away as the days go by. Poor latch-on and positioning are common causes of sore nipples. This is usually because the baby is not getting enough of the areola into his or her  mouth, and is sucking mostly on the nipple. The areola is the colored portion around the nipple. In general, nurse early and often. Nurse with the nipple and areola in the baby's mouth, not just the nipple. And feed your baby on demand.   If you have sore nipples and put off feedings because of the pain, this can lead to your breasts becoming overly full. This may lead to plugged milk ducts in the breast followed by engorgement or even infection of the breasts. If your baby is latched on correctly, he or she should be able to nurse as long as needed  without causing any pain. If it hurts, take the baby off of your breast and try again. Ask for help if it is still painful for you.   Check the positioning of your baby's body and the way she latches on and sucks. To minimize soreness, your baby's mouth should be open wide with as much of the areola in his or her mouth as possible. You should find that it feels better right away once the baby is positioned correctly.   Do not delay feedings. Try to relax so your let-down reflex comes easily. You also can hand-express a little milk before beginning the feeding so your baby does not clamp down harder, waiting for the milk to come.   If your nipples are very sore, it may be helpful to change positions each time you nurse. This puts the pressure on a different part of the nipple.   After nursing, you can also express a few drops of milk and gently rub it on your nipples. Human milk has natural healing properties. Let your nipples air-dry after feeding, or wear a soft-cotton shirt.   Wearing a nipple shield during nursing will not relieve sore nipples. They actually can prolong soreness by making it hard for the baby to learn to nurse without the shield.   Avoid wearing bras or clothes that are too tight and put pressure on your nipples. If you wear a bra, get one that offers good support to your breasts.   Change nursing pads often to avoid trapping in  moisture. Only use cotton pads.   Avoid using soap, ointments or other chemicals on your nipples. Make sure to avoid products that must be removed before nursing. Washing with clean water is all that is necessary to keep your nipples and breasts clean.   Try rubbing pure lanolin on your nipples after breastfeeding to soothe the pain.   Making sure you get enough rest, eating healthy foods, and getting enough fluids also can help the healing process. If you have very sore nipples, you can ask your caregiver about using non-aspirin pain relievers.   Another cause of sore nursing is a condition called thrush. This is a fungal infection that can form on your nipples from the milk. Other signs of thrush include itching, flaking and drying skin, tender or pink skin. The infection also can form in the baby's mouth from having contact with your nipples. There it appears as little white spots on the inside of the cheeks, gums, or tongue. It also can appear as a diaper rash on your baby that will not go away by using regular diaper rash ointments. If you have any of these symptoms or think you have thrush, contact your doctor and your baby's doctor, or a Advertising copywriter. A lactation consultant is a breastfeeding Risk analyst. You can get medication for your nipples and for your baby.   If you still have sore nipples after following the above tips, you may need to see someone who is trained in breastfeeding, like a Advertising copywriter.  ENGORGEMENT Engorgement is a condition after pregnancy, when your breasts feel very hard and painful. You also may have breast swelling, tenderness, warmth, redness, throbbing and flattening of the nipple. Engorgement may cause a low-grade fever. This can be confused with a breast infection. Engorgement is the result of the milk building up. It usually happens during the third to fifth day after birth. This slows circulation. When blood and lymph move through the  breasts,  fluid from the blood vessels can seep into the breast tissues. All of the following can cause engorgement.  Poor positioning.   Infrequent feedings.   Giving supplementary bottles of water, juice, formula, or breast milk or using a pacifier. All of these cut down on your feeding and may lead to engorgement.   Changing the breastfeeding schedule with decreasing in feeding.   The baby changes the nursing pattern.   Having a baby with a weak suck who is not able to nurse effectively.   Fatigue, stress, or anemia in the mother.   An overabundant milk supply.   Nipple damage.   Breast abnormalities.  Engorgement can lead to plugged ducts or a breast infection. So it is important to try to prevent it before this happens. If treated properly, engorgement should only last for one to two days.  Minimize engorgement by making sure the baby is latched on and positioned correctly at your breast.   Nurse frequently after birth. Allow the baby to nurse as long as he/she likes, as long as he/she is latched on well and sucking effectively.   In the early days when your milk is coming in, you should awaken a sleepy baby every 2 to 3 hours to breastfeed. Breastfeeding often on the affected side helps to remove the milk and keeps milk moving freely. This prevents overfilling of the breast.   Avoid additional bottles and pacifiers.   Try hand expressing or pumping a little milk to first soften the breast, areola, and nipple before breastfeeding. Or massage the breast before feeding.   Cold compresses in between feedings can help ease pain and swelling.   If you are returning to work, try to pump your milk on the same schedule your baby was fed.   Eat a well balanced diet and drink plenty of fluids.   Use a well-fitting, supportive bra that is not too tight.   If your engorgement lasts for more than two days even after treating it, contact a Advertising copywriter.   Use a breast pump to  keep up with your nursing schedule.   Use a breast pump if your baby is not taking enough milk or you feel you may be getting engorged.  PLUGGED DUCTS AND INFECTION Plugged ducts and breast infection (mastitis) are common sources of sore breasts postpartum. It is common for many women to have a plugged duct in the breast at some point if breastfeeding.   A plugged milk duct feels like a tender, sore, lump in the breast. It is not accompanied by a fever or other symptoms. It happens when a milk duct does not properly drain. Then, pressure builds up behind the plug, and surrounding tissue becomes inflamed. A plugged duct usually only occurs in one breast at a time.   A breast infection (mastitis), on the other hand, is soreness or a lump in the breast that can be accompanied by a fever and/or flu-like symptoms. You may feel run down or very achy. Some women with a breast infection also have nausea and vomiting. You also may have yellowish discharge from the nipple that looks like colostrum. Or the breasts feel warm or hot to the touch and appear pink or red. A breast infection can occur when other family members have a cold or the flu. Like a plugged duct, it usually only occurs in one breast. It is not always easy to tell the difference between a breast infection and a plugged duct. Both have similar  symptoms and can improve within 24 to 48 hours.   Treatment for plugged ducts and breast infections is similar. But some breast infections need to also be treated with an antibiotic.   If mastitis is not treated quickly, it may lead to a breast abscess.   It may help to massage the area, starting behind the sore spot. Use your fingers in a circular motion and massage toward the nipple.   Breastfeed more often on the affected side. This helps loosen the plug, keeps the milk moving freely, and the breast from becoming overly full. Nursing every two hours, both day and night on the affected side first can be  helpful.   Getting extra sleep or relaxing with your feet up can help speed healing. Often a plugged duct or breast infection is the first sign that a mother is doing too much and becoming overly tired.   Wear a well-fitting supportive bra that is not too tight, since this can constrict milk ducts.   If you do not feel better within 24 hours of trying these steps, and you have a fever or your symptoms worsen, call your doctor. You may need an antibiotic. Also, if you have a breast infection in which both breasts look affected, or there is pus or blood in the milk, red streaks near the area, or your symptoms came on severely and suddenly, see your doctor right away.   Even if you need an antibiotic, continuing to breastfeed during treatment is best for both you and your baby. Most antibiotics will not affect your baby through your breast milk.  THRUSH Thrush (yeast) is a fungal infection that can form on your nipples or in your breast because it thrives on milk. The infection forms from an overgrowth of the candida organism. Candida usually exists in our bodies and is kept at healthy levels by the natural bacteria in our bodies. But, when the natural balance of bacteria is upset, candida can overgrow, causing an infection. Some of the things that can cause thrush include:   Having an overly moist environment on your skin or nipples that are sore or cracked.   Taking antibiotics, birth control pills or steroids.   Having a diet that contains large amounts of sugar or foods with yeast.   Having a chronic illness like HIV infection, diabetes, or anemia.  If you have sore nipples that last more than a few days even after you make sure your baby's latch and positioning is correct, or you suddenly get sore nipples after several weeks of unpainful nursing, you could have thrush. Some other signs of thrush include:   Pink, flaky, shiny, itchy or cracked nipples.   Deep pink and blistered nipples.    You also could have shooting pains deep in the breast during or after feedings, or achy breasts.  The infection also can form in your baby's mouth from having contact with your nipples, and appear as little white spots on the inside of the cheeks, gums, or tongue. It also can appear as a diaper rash (small red dots around a rash) on your baby that will not go away by using regular diaper rash ointments. Many babies with thrush refuse to nurse, or are gassy or cranky.  Solution:   If you or your baby have any of these symptoms, contact your doctor and your baby's doctor so you both can be correctly diagnosed.   You can get medication for your nipples and for your baby. Medication  for a mother is usually an ointment for the nipples. Your baby can be given a liquid medication for his/her mouth, and/or an ointment for any diaper rash.   Change disposable nursing pads often. Wash any towels or clothing that come in contact with the yeast in very hot water (above 122 F or 50 C).   Wear a clean bra every day. Wash your hands often, and wash your baby's hands often, especially if he or she sucks on his/her fingers.   Only use cotton pads.   Boil any pacifiers, bottle nipples, or toys your baby puts in his or her mouth once a day for 20 minutes to kill the thrush. After one week of treatment, discard pacifiers and nipples and buy new ones.   Boil daily for 20 minutes all breast pump parts that touch the milk.   Make sure other family members are free of thrush or other fungal infections. If they have symptoms, get them treatment.  NURSING STRIKE A nursing strike is when your baby has been nursing well for months, then suddenly loses interest in breastfeeding and begins to refuse the breast. A nursing strike can mean several things are happening with your baby and that she or he is trying to communicate with you to let you know that something is wrong. Not all babies will react the same to different  situations that can cause a nursing strike. Some will continue to breastfeed without a problem, others may just become fussy at the breast, and others will refuse the breast entirely. Some of the major causes of a nursing strike include:  Mouth pain from teething, a fungal infection, or a cold sore.   An ear infection.   Pain from a certain nursing position.   Being upset about a long separation from the mother or a major change in routine.   Being interested in other things around him or her.   A cold or stuffy nose that makes breathing difficult.   Reduced milk supply from supplementing with bottles or overuse of a pacifier.   Responding to the mother's strong reaction if the baby has bitten her.   Being upset about hearing arguing or people talking in a harsh voice with other family members while nursing.   Reacting to stress, over-stimulation, or having been repeatedly put off when wanting to nurse.   If your baby is on a nursing strike, it is normal to feel frustrated and upset, especially if your baby is unhappy. It is important not to feel guilty or that you have done something wrong. Your breasts also may become uncomfortable as the milk builds up.  Treatment  Try to express your milk manually or with a breast pump on the same schedule as the baby used to breastfeed to avoid engorgement and plugged ducts.   Try another feeding method temporarily to give your baby your milk, such as a cup, dropper, or spoon. Keep track of your baby's wet diapers to make sure he/she is getting enough milk (5 to 6 per day).   Keep offering your breast to the baby. If the baby is frustrated, stop and try again later. Try when the baby is sleeping or very sleepy.   Try various breastfeeding positions.   Focus on the baby with all of your attention and comfort him or her with extra touching and cuddling.   Try nursing while rocking and in a quiet room free of distractions.  INVERTED OR LARGE  NIPPLES Some women  have nipples that naturally are inverted, or that turn inward instead of protruding, or that are flat and do not protrude. Inverted or flat nipples can sometimes make it harder to breastfeed because your baby can have a harder time latching on. But remember that for breastfeeding to work, your baby has to latch on to both the nipple and the breast, so even inverted nipples can work just fine. Very large nipples can make it hard for the baby to get enough of the areola into his or her mouth to compress the milk ducts and get enough milk.  Know what type of nipples you have before you have your baby, so you can be prepared in case you have a problem getting your baby to latch on correctly.   Talk with a lactation consultant at the hospital or at a breastfeeding clinic for extra help if you have flat, inverted, or very large nipples.   Sometimes a Advertising copywriter can help inverted nipples to be pulled out with a small device before your baby is brought to your breast.   In many cases, inverted nipples will protrude more as the baby starts to latch on and as time passes. The baby's sucking will help.   Flat nipples cause fewer problems than inverted nipples. Good latch-on and positioning are usually enough to ensure that a baby latched to a flat nipple breastfeeds well.   The latch for babies of mothers with very large nipples will improve with time as the baby grows. In some cases, it might take several weeks to get the baby to latch well. A good milk supply helps insure that her baby will get enough milk.  RETURNING TO WORK More and more women are breastfeeding when they return to work because they believe in the benefits of breastfeeding. You can purchase or rent effective breast pumps and storage containers for your milk. Many employers are willing to set up special rooms for mothers who pump. But others are not as educated about the benefits of breastfeeding. Also, many women  are not able to take off as much time as they'd like after having their babies and might have to return to work before breastfeeding is well established. After you have your baby, take as much time off as possible. This will help you get your breastfeeding established well and may reduce the number of months you may need to pump your milk while you are at work.   If you plan to have your baby take a bottle of expressed breast milk while you are at work, you can introduce your baby to a bottle when he or she is around four weeks old. Otherwise, the baby might not accept the bottle later on. Once your baby is comfortable taking a bottle, it is a good idea to have Dad or another family member offer a bottle of pumped breast milk on a regular basis so the baby stays in practice.   Let your employer or Geophysical data processor know that you plan to continue breastfeeding once you return to work. Before you return to work, or even before you have your baby, start talking with your employer about breastfeeding. Do not be afraid to request a clean and private area where you can pump your milk. If you do not have your own office space, you can ask to use a supervisor's office during certain times. Or you can ask to have a clean, clutter free corner of a storage room. All you need  is a chair, a small table, and an outlet if you are using an electric pump. Many electric pumps also can run on batteries and do not require an outlet. You can lock the door and place a small sign on it that asks for some privacy. You can pump your breast milk during lunch or other breaks. You could suggest to your employer that you are willing to make up work time for time spent pumping milk.   After pumping, you can refrigerate your milk, place it in a cooler, or freeze it for the baby to be fed later. Many breast pumps come with carrying cases that have a section to store your milk with ice packs. If you do not have access to a  refrigerator, you can leave it at room temperatures for up to 6 hours.   Many employers are not aware of state laws that state they have to allow you to breastfeed at your job. Under these laws, your employer is required to set up a space for you to breastfeed and/or allow paid/unpaid time for breastfeeding employees. To see if your state has a breastfeeding law for employers, go to https://www.warren.info/ or call us at 1-800-LALECHE (in [place]).  JAUNDICE  Jaundice is a condition that is common in many newborns. It appears as a yellowing of the skin and eyes. It is caused by an excess of bilirubin, a yellow pigment that is a product in the blood. All babies are born with extra red blood cells that undergo a process of being broken down and eliminated from the body. Bilirubin levels in the blood can be high because of:  Higher production of it in a newborn.   Increased ability of the newborn intestine to absorb it.   Limited ability of the newborn liver to handle large amounts of it.  Many cases of jaundice do not need to be treated. Your baby's doctor will carefully monitor your baby's bilirubin levels. Sometimes infants have to be temporarily separated from their mothers to receive special treatment with phototherapy (aiming lights on the baby). In these cases, breastfeeding is encouraged but supplements may also be given to the baby. American Academy of Pediatrics advises against stopping breastfeeding in jaundiced babies and suggests continuing frequent breastfeeding, even during treatment. If your baby is jaundiced or develops jaundice, it is important to discuss with your baby's caregiver all possible treatment options. Share that you do not want to interrupt nursing if this is at all possible.  REFLUX  It is not unusual for babies to spit up after nursing. Usually, babies can spit up and show no other signs of illness. The spitting up disappears as the baby's digestive system matures.  As long as the baby has 6 to 8 wet diapers and at least 2 bowel movements in a 24 hour period (under 59 weeks of age), and your baby is gaining weight (at least 4 ounces a week) you can be assured your baby is getting enough milk.  However, some babies have a condition called gastroesophageal reflux (GER). This happens when the muscle at the opening of the stomach opens at the wrong times, allowing milk and food to come back up into the the tube in the throat (esophagus). Symptoms of GER can include:   Crying as if in discomfort.   Waking up frequently at night.   Problems swallowing.   Frequent red or sore throat.   Signs of asthma, bronchitis, wheezing or problems breathing.   Projectile vomiting.  Arching of the back as if in severe pain.   Slow weight gain.   Gagging or choking.   Frequent hiccupping or burping.   Severe spitting up, or spitting up after every feeding, or hours after eating.   Refusal to eat.  Many healthy babies might have some of these symptoms and do not have GER. But there are babies who might only have a few of these symptoms and have a severe case of GER. Not all babies with GER spit up or vomit.  Some babies with GER do not have a serious medical problem. But caring for them can be hard since they tend to be very fussy and wake up frequently at night. More severe cases of GER may need to be treated with medication if the baby, in addition to spitting up, also refuses to nurse, gains weight poorly or is losing weight, or has periods of gagging or choking.   If your baby spits up after every feeding and has any of the other symptoms mentioned above, it is best to see his or her doctor for a correct diagnosis. Other than GER, your baby could have another condition that needs treatment. If there are no other signs of illness, he/she could just be sensitive to a food in your diet or a medication he/she is receiving. If your baby has GER, it is important to try to  continue to breastfeed since breast milk still is more easily digested than formula. Try smaller, more frequent feedings, thorough burping, and putting the baby in an upright position during and after feedings.  CLEFT PALATE AND CLEFT LIP  Cleft palate and cleft lip are some of the most common birth defects that happen as a baby is developing in the womb. A cleft, or opening, in either the palate or lip can happen together or separately and both can be corrected through surgery. Both conditions can prevent babies from breastfeeding because a baby cannot form a good seal around the nipple and areola with his or her mouth, or get milk out of the breast well.   Cleft palate can happen on one or both sides of a baby's mouth and be partial or complete. Right after birth, a mother whose baby has a cleft palate can try to breastfeed her baby, and she can start expressing her milk right away to keep up her supply. Even if her baby cannot latch on well to her breast, the baby can be fed breast milk by cup. In some hospitals, babies with cleft palate are fitted with a mouthpiece called an obturator. This fits into the cleft and seals it for easier feeding. The baby should be able to exclusively breastfeed after surgery.   Cleft lip can happen on one or both sides of a baby's lip. But a mother can try different breastfeeding positions and use her thumb or breast to help fill in the opening left by the lip to form a seal around the breast. With cleft lip repair, breastfeeding may only have to be stopped for a few hours.   If your baby is born with a cleft palate or cleft lip, talk with a lactation consultant in the hospital for assistance as soon as possible. Human milk and early breastfeeding is still best for your baby's health.  TWINS OR MULTIPLES Mothers of twins or multiples might feel overwhelmed with the idea of breastfeeding more than one baby at a time. The benefits of human milk to both these mothers and  babies are the same as for all mothers and babies. When breastfeeding twins, your breast milk will increase to the amount the babies will need. You will have to take in more calories and liquids when nursing twins. If the babies are premature and unable to nurse, you can pump your breasts and freeze the milk until the babies are ready to nurse. But mothers of multiples get even more benefits from breastfeeding:  Their uterus contracts, which is helpful because it has stretched even more to hold more than one baby.   Hormones are released that relax the mother, which is helpful with the added stress of caring for more than one baby.   Eight to ten hours per week are saved because there is no need to prepare formula or bottles and the mother's milk is available right away.   Breastfeeding early and often for a mother of multiples is important to keep up her milk supply. A good latch-on for each baby is important to avoid sore nipples. Many mothers find that it is easier to nurse the babies together rather than separately, and that it gets easier as the babies get older. There are many breastfeeding holds that make it easier to nurse more than one baby at a time. If you are having multiples, talk with a lactation consultant about more ways you can successfully breastfeed your babies.  BREASTFEEDING DURING PREGNANCY While most mothers who are nursing a toddler stop breastfeeding if they find out they are pregnant, it is an individual choice to decide whether to keep breastfeeding during the pregnancy. It is not unsafe for the unborn child if you continue to breastfeed an older child during this time. But, if you are having some problems in your pregnancy such as uterine pain or bleeding, a history of preterm labor or problems gaining weight during pregnancy, your doctor may advise you to wean. Your child also may decide to wean on his or her own because pregnancy changes the amount and flavor of your milk.  Some women also choose to wean at this time because they have nipple soreness caused by pregnancy hormones, are nauseous, tire more easily, or find that their growing stomachs make breastfeeding uncomfortable. You will need more calories when you breastfeed while pregnant. Your milk production usually slows down around the fourth month of pregnancy.  BREASTFEEDING AFTER BREAST SURGERY   If you have had breast surgery, including breast implants, you might be worried about whether you will be able to breastfeed. The most important things that affect whether you can produce enough milk for your baby are how your surgery was done and where your incisions are, and the reasons for your surgery. For example, women who have had incisions in the fold under the breasts are less likely to have problems producing milk than women who have had incisions around or across the areola. Incisions around the areola can cut into milk ducts and nerves, where milk is produced and travels. Women who have had breast surgery to augment breasts that never fully developed may not have enough glands to produce a full milk supply.   If you had breast surgery and are worried about how it will affect breastfeeding, talk with a Advertising copywriter. If you are planning breast surgery and are worried about how it will affect breastfeeding, talk with your surgeon about ways he or she can preserve as much of the breast tissue and milk ducts as possible.  INDUCING LACTATION  Many mothers who adopt  want to breastfeed their babies and can do it successfully with some help. It is successful ? to  of the time it is tried. Many will need to supplement their breast milk with donated breast milk or infant formula. But some adoptive mothers can breastfeed exclusively, especially if they have been pregnant before. Lactation is a hormonal response to a physical action. So the stimulation of the baby nursing causes the body to see a need for and produce  milk. The more the baby nurses, the more a woman's body will produce milk.   Beginning a combination of hormone treatment several months before the baby is born should help facilitate lactation in many cases.   One thing you can do to prepare is to pump every 3 hours around the clock for two to three weeks before your baby arrives. Or you can wait until the baby arrives and starts to nurse. Breast milk can be frozen until you are ready to nurse the baby. A supplemental nursing system (SNS) or a lactation aid can help ensure that your baby gets enough nutrition and that your breasts are stimulated to produce milk at the same time.   If you are an adoptive mother who wants to breastfeed, you should see both a Advertising copywriter and your doctor for help in establishing an initial milk supply.  FOR MORE INFORMATION [employer]: www.llli.org Document Released: 02/02/2006 Document Revised: 07/31/2011 Document Reviewed: 04/26/2008 [facility] Patient Information 2012 [place], [place]. Circumcision Care, Newborn There are two commonly used techniques to perform a circumcision:   Clamp circumcision.   Plastic ring circumcision.  If a clamp circumcision method was used, it is not unusual to have some blood on the gauze, but there should not be any active bleeding. The gauze can be removed 24 hours after the procedure. When gauze is removed, there may be a little bleeding, but this should stop with gentle pressure. After the gauze has been removed, wash the penis gently with a soft cloth or cotton ball and dry it. You may apply petroleum jelly to the penis several times a day when changing a diaper, until well healed. If a plastic ring circumcision was done, gently wash and dry the penis. It is not necessary to apply petroleum jelly. The plastic ring at the end of the penis will loosen around the edges and drop off within 5 to 8 days after the circumcision was done. The string (ligature) will  dissolve or fall off by itself. With either method of circumcision, your baby should urinate normally, despite any dressing or bell. SEEK MEDICAL CARE IF:   Your baby has a rectal temperature of 100.5 F (38.1 C) or higher lasting more than a day AND your baby is over age 33 months.   You have any questions about how your baby's circumcision is doing.   If the clamp has not dropped off after 8 days.   If the penis becomes very swollen and has drainage or bright red bleeding.  SEEK IMMEDIATE MEDICAL CARE IF:   Your baby is 51 months old or younger with a rectal temperature of 100.4 F (38 C) or higher.   Your baby is older than 3 months with a rectal temperature of 102 F (38.9 C) or higher.  Document Released: 11/01/2003 Document Revised: 07/31/2011 Document Reviewed: 11/30/2008 [facility] Patient Information 2012 [place], [place].

## 2012-01-01 NOTE — Discharge Summary (Signed)
Obstetric Discharge Summary Reason for Admission: onset of labor Prenatal Procedures: ultrasound Intrapartum Procedures: cesarean: low cervical, transverse Postpartum Procedures: none Complications-Operative and Postpartum: none Hemoglobin  Date Value Range Status  12/30/2011 8.3* 12.0-15.0 (g/dL) Final     DELTA CHECK NOTED     REPEATED TO VERIFY     HCT  Date Value Range Status  12/30/2011 26.4* 36.0-46.0 (%) Final  Hospital course:  Pt presented in active labor at 40.2 weeks on 12/29/11; cx on admission=4/100/-1.  No HSV s/s and neg SSE.  Pt reported ctxs since afternoon before and progressively worsened overnight leading to morning of admission.  Variable decel noted while in MAU.  Pt received epidural around 0900, and following placement, occ'l variables noted.  AROM at 1037 for Moderate MSF and IUPC inserted; cx=6/90/-1.  Continued intermittent variables noted thereafter, followed by some prolonged decels as well despite amnioinfusion.  Cervix remained unchanged despite AROM, and just after noon that day, c/s was recommended secondary to NRFHT.  Pt and newborn tolerated procedure well.  Pt's postpartum course was complicated by significant anemia, but pt had normal orthostatic VS and started on po iron supplement.  Lactation was initiated and established at by time of  d/c.  She was up ad lib and pain well controlled.    Physical Exam: see today's progress note  Discharge Diagnoses: Term Pregnancy-delivered and s/p primary LTCS for NRFHT; Meconium in fluid; Lactating.  Postpartum anemia w/o hemodynamic instability. h/o stds, including HSV, and h/o abnl pap.   Discharge Information: Date: 01/01/2012 POD#3 Activity: pelvic rest Diet: iro-rich Medications: PNV, Ibuprofen, Colace, Percocet and Ferralet 90 one tab po qd Condition: stable Instructions: refer to practice specific booklet Discharge to: home Follow-up Information    Follow up with [facility] in 6 weeks. (or call as  needed w/ any questions or concerns)          Newborn Data: Live born female "[place]"  (delivery provider:  Dr. Osborn Coho, MD and Sanda Klein, CNM) Birth Weight: 7 lb 7.8 oz (3395 g) APGAR: 8, 9  Home with mother. Plan outpatient circ Per LC recommendation, pt agreeable to defer depo provera at present and practice abstinence until 6 wk PP check up. Johnross Nabozny H 01/01/2012, 8:28 AM

## 2012-01-01 NOTE — Progress Notes (Addendum)
Subjective: Postpartum Day 3: Cesarean Delivery Patient reports tolerating PO, + flatus, + BM and no problems voiding. Desires DepoProvera before d/c today.  Up ad lib.  VB light.  Pain controlled w/ Motrin and Percocet.     Objective: Vital signs in last 24 hours: Temp:  [97.8 F (36.6 C)-99.1 F (37.3 C)] 98.1 F (36.7 C) (05/09 0557) Pulse Rate:  [77-98] 90  (05/09 0557) Resp:  [18-19] 18  (05/09 0557) BP: (91-113)/(57-67) 91/57 mmHg (05/09 0557) SpO2:  [99 %] 99 % (05/08 1700)  Physical Exam:  General: alert, cooperative, no distress and sleepy Lochia: appropriate Uterine Fundus: firm, u/-2 Incision: healing well, no significant drainage, SS intact DVT Evaluation: No evidence of DVT seen on physical exam. Negative Homan's sign. RLE  w/ 1+ edema; min swelling LLE (pt reports same during pregnancy)   Basename 12/30/11 0545  HGB 8.3*  HCT 26.4*    Assessment/Plan: Status post Cesarean section. Postoperative course complicated by anemia. Lactation established and going well.  H/o abnl pap; h/o stds in past, including HSV. Discharge home with standard precautions and return to clinic in 4-6 weeks. D/c instructions per [facility] pamphlet; warning s/s to report rev'd.  Iron-rich food sheet given.  PPD pamphlet given.  Plans outpatient circ. Antonietta Breach 01/01/2012, 8:08 AM  Addendum:  4098 After talking with lactation consult, she did not recommend depo at this time, and referenced newest edition of Bobbye Morton (2011) sites preferably defer Depo until 6 weeks or after to promote optimal milk supply. Disc'd LC recommendation w/ pt and she is agreeable to defer and will practice abstinence until 6 week PP appt.  Rexene Edison, CNM

## 2012-03-26 ENCOUNTER — Other Ambulatory Visit: Payer: Self-pay | Admitting: Obstetrics and Gynecology

## 2012-03-26 MED ORDER — LEVONORGESTREL 0.75 MG PO TABS: 0.7500 mg | ORAL_TABLET | Freq: Two times a day (BID) | ORAL | Status: DC

## 2012-03-26 NOTE — Telephone Encounter (Signed)
Tc to pt regarding msg, lm on vm to call back. 

## 2012-03-26 NOTE — Telephone Encounter (Signed)
Pt called requesting Plan B and BC pill called in.  Pt advised will need an appt for the oral contraceptives bc this office has not seen her for PP visit, needs appt to discuss options.  Pt says had unprotected intercourse <72 hours ago and wants Plan B.  Per EP ok for pt to have Plan B.  Rx e-prescribed to pt's pharmacy of choice, pt voices agreement.

## 2012-03-26 NOTE — Telephone Encounter (Signed)
TRIAGE/RX °

## 2012-04-02 ENCOUNTER — Encounter: Payer: Self-pay | Admitting: Obstetrics and Gynecology

## 2012-04-02 ENCOUNTER — Encounter: Payer: Medicaid Other | Admitting: Obstetrics and Gynecology

## 2012-05-04 ENCOUNTER — Encounter: Payer: Self-pay | Admitting: Gynecology

## 2012-05-14 ENCOUNTER — Encounter: Payer: Self-pay | Admitting: Gynecology

## 2012-05-18 ENCOUNTER — Other Ambulatory Visit (HOSPITAL_COMMUNITY)
Admission: RE | Admit: 2012-05-18 | Discharge: 2012-05-18 | Disposition: A | Discharge status: Home / Self Care | Payer: 59 | Source: Ambulatory Visit | Attending: Gynecology | Admitting: Gynecology

## 2012-05-18 ENCOUNTER — Ambulatory Visit (INDEPENDENT_AMBULATORY_CARE_PROVIDER_SITE_OTHER): Payer: 59 | Admitting: Gynecology

## 2012-05-18 ENCOUNTER — Encounter: Payer: Self-pay | Admitting: Gynecology

## 2012-05-18 VITALS — BP 126/78 | Ht 61.0 in | Wt 137.0 lb

## 2012-05-18 DIAGNOSIS — Z113 Encounter for screening for infections with a predominantly sexual mode of transmission: Secondary | ICD-10-CM

## 2012-05-18 DIAGNOSIS — Z23 Encounter for immunization: Secondary | ICD-10-CM

## 2012-05-18 DIAGNOSIS — Z01419 Encounter for gynecological examination (general) (routine) without abnormal findings: Secondary | ICD-10-CM

## 2012-05-18 DIAGNOSIS — E049 Nontoxic goiter, unspecified: Secondary | ICD-10-CM

## 2012-05-18 LAB — CBC WITH DIFFERENTIAL/PLATELET
Basophils Relative: 0 % (ref 0–1)
Eosinophils Absolute: 0.1 10*3/uL (ref 0.0–0.7)
Eosinophils Relative: 2 % (ref 0–5)
HCT: 38.3 % (ref 36.0–46.0)
Hemoglobin: 12.2 g/dL (ref 12.0–15.0)
MCH: 21.4 pg — ABNORMAL LOW (ref 26.0–34.0)
MCHC: 31.9 g/dL (ref 30.0–36.0)
MCV: 67.2 fL — ABNORMAL LOW (ref 78.0–100.0)
Monocytes Absolute: 0.3 10*3/uL (ref 0.1–1.0)
Monocytes Relative: 5 % (ref 3–12)
Neutrophils Relative %: 33 % — ABNORMAL LOW (ref 43–77)

## 2012-05-18 NOTE — Patient Instructions (Addendum)
Health Maintenance, Females A healthy lifestyle and preventative care can promote health and wellness.  Maintain regular health, dental, and eye exams.   Eat a healthy diet. Foods like vegetables, fruits, whole grains, low-fat dairy products, and lean protein foods contain the nutrients you need without too many calories. Decrease your intake of foods high in solid fats, added sugars, and salt. Get information about a proper diet from your caregiver, if necessary.   Regular physical exercise is one of the most important things you can do for your health. Most adults should get at least 150 minutes of moderate-intensity exercise (any activity that increases your heart rate and causes you to sweat) each week. In addition, most adults need muscle-strengthening exercises on 2 or more days a week.    Maintain a healthy weight. The body mass index (BMI) is a screening tool to identify possible weight problems. It provides an estimate of body fat based on height and weight. Your caregiver can help determine your BMI, and can help you achieve or maintain a healthy weight. For adults 20 years and older:   A BMI below 18.5 is considered underweight.   A BMI of 18.5 to 24.9 is normal.   A BMI of 25 to 29.9 is considered overweight.   A BMI of 30 and above is considered obese.   Maintain normal blood lipids and cholesterol by exercising and minimizing your intake of saturated fat. Eat a balanced diet with plenty of fruits and vegetables. Blood tests for lipids and cholesterol should begin at age 20 and be repeated every 5 years. If your lipid or cholesterol levels are high, you are over 50, or you are a high risk for heart disease, you may need your cholesterol levels checked more frequently.Ongoing high lipid and cholesterol levels should be treated with medicines if diet and exercise are not effective.   If you smoke, find out from your caregiver how to quit. If you do not use tobacco, do not start.    If you are pregnant, do not drink alcohol. If you are breastfeeding, be very cautious about drinking alcohol. If you are not pregnant and choose to drink alcohol, do not exceed 1 drink per day. One drink is considered to be 12 ounces (355 mL) of beer, 5 ounces (148 mL) of wine, or 1.5 ounces (44 mL) of liquor.   Avoid use of street drugs. Do not share needles with anyone. Ask for help if you need support or instructions about stopping the use of drugs.   High blood pressure causes heart disease and increases the risk of stroke. Blood pressure should be checked at least every 1 to 2 years. Ongoing high blood pressure should be treated with medicines, if weight loss and exercise are not effective.   If you are 55 to 23 years old, ask your caregiver if you should take aspirin to prevent strokes.   Diabetes screening involves taking a blood sample to check your fasting blood sugar level. This should be done once every 3 years, after age 45, if you are within normal weight and without risk factors for diabetes. Testing should be considered at a younger age or be carried out more frequently if you are overweight and have at least 1 risk factor for diabetes.   Breast cancer screening is essential preventative care for women. You should practice "breast self-awareness." This means understanding the normal appearance and feel of your breasts and may include breast self-examination. Any changes detected, no matter how   small, should be reported to a caregiver. Women in their 20s and 30s should have a clinical breast exam (CBE) by a caregiver as part of a regular health exam every 1 to 3 years. After age 40, women should have a CBE every year. Starting at age 40, women should consider having a mammogram (breast X-ray) every year. Women who have a family history of breast cancer should talk to their caregiver about genetic screening. Women at a high risk of breast cancer should talk to their caregiver about having  an MRI and a mammogram every year.   The Pap test is a screening test for cervical cancer. Women should have a Pap test starting at age 21. Between ages 21 and 29, Pap tests should be repeated every 2 years. Beginning at age 30, you should have a Pap test every 3 years as long as the past 3 Pap tests have been normal. If you had a hysterectomy for a problem that was not cancer or a condition that could lead to cancer, then you no longer need Pap tests. If you are between ages 65 and 70, and you have had normal Pap tests going back 10 years, you no longer need Pap tests. If you have had past treatment for cervical cancer or a condition that could lead to cancer, you need Pap tests and screening for cancer for at least 20 years after your treatment. If Pap tests have been discontinued, risk factors (such as a new sexual partner) need to be reassessed to determine if screening should be resumed. Some women have medical problems that increase the chance of getting cervical cancer. In these cases, your caregiver may recommend more frequent screening and Pap tests.   The human papillomavirus (HPV) test is an additional test that may be used for cervical cancer screening. The HPV test looks for the virus that can cause the cell changes on the cervix. The cells collected during the Pap test can be tested for HPV. The HPV test could be used to screen women aged 30 years and older, and should be used in women of any age who have unclear Pap test results. After the age of 30, women should have HPV testing at the same frequency as a Pap test.   Colorectal cancer can be detected and often prevented. Most routine colorectal cancer screening begins at the age of 50 and continues through age 75. However, your caregiver may recommend screening at an earlier age if you have risk factors for colon cancer. On a yearly basis, your caregiver may provide home test kits to check for hidden blood in the stool. Use of a small camera at  the end of a tube, to directly examine the colon (sigmoidoscopy or colonoscopy), can detect the earliest forms of colorectal cancer. Talk to your caregiver about this at age 50, when routine screening begins. Direct examination of the colon should be repeated every 5 to 10 years through age 75, unless early forms of pre-cancerous polyps or small growths are found.   Hepatitis C blood testing is recommended for all people born from 1945 through 1965 and any individual with known risks for hepatitis C.   Practice safe sex. Use condoms and avoid high-risk sexual practices to reduce the spread of sexually transmitted infections (STIs). Sexually active women aged 25 and younger should be checked for Chlamydia, which is a common sexually transmitted infection. Older women with new or multiple partners should also be tested for Chlamydia. Testing for other   STIs is recommended if you are sexually active and at increased risk.   Osteoporosis is a disease in which the bones lose minerals and strength with aging. This can result in serious bone fractures. The risk of osteoporosis can be identified using a bone density scan. Women ages 34 and over and women at risk for fractures or osteoporosis should discuss screening with their caregivers. Ask your caregiver whether you should be taking a calcium supplement or vitamin D to reduce the rate of osteoporosis.   Menopause can be associated with physical symptoms and risks. Hormone replacement therapy is available to decrease symptoms and risks. You should talk to your caregiver about whether hormone replacement therapy is right for you.   Use sunscreen with a sun protection factor (SPF) of 30 or greater. Apply sunscreen liberally and repeatedly throughout the day. You should seek shade when your shadow is shorter than you. Protect yourself by wearing long sleeves, pants, a wide-brimmed hat, and sunglasses year round, whenever you are outdoors.   Notify your caregiver  of new moles or changes in moles, especially if there is a change in shape or color. Also notify your caregiver if a mole is larger than the size of a pencil eraser.   Stay current with your immunizations.  Document Released: 02/24/2011 Document Revised: 07/31/2011 Document Reviewed: 02/24/2011 [facility] Patient Information 2012 [place], [place].  Thyroid Cyst The thyroid gland is a butterfly-shaped gland in the middle of the neck, located just below the voice box. It makes thyroid hormone. Thyroid hormone has an effect on nearly all tissues of your body by regulating your metabolism. Metabolism is the breakdown and use of food that you eat or energy that is stored in your body. Your metabolism affects your heart rate, blood pressure, body temperature, and weight. Thyroid cysts are enlarged fluid filled regions of the thyroid gland. These cysts range in size and may expand and enlarge suddenly. Rapidly expanding cysts may cause pain, difficulty swallowing, and rarely, difficulty breathing. Most cysts of the thyroid are not cancerous (benign). SYMPTOMS Bleeding may occur within the cyst. If the bleeding is severe, the cyst may get larger and produce problems in the neck, including swelling that may produce pain and difficultly swallowing. If the vocal cords are compressed, hoarseness may occur. If the windpipe is compressed, you may have difficulty breathing. DIAGNOSIS  A thyroid cyst is diagnosed through physical exam. The diagnosis can be confirmed by an ultrasound exam of the neck. This creates a picture by bouncing sound waves off the thyroid gland. Sometimes the cysts are drained using a fine needle. The fluid is then sent to the lab where it can be examined. This is done to see if any cells in the fluid are cancerous. If they are found to be cancerous, you will need further treatment.  TREATMENT  If the fluid in your neck does not show evidence of cancer, your caregiver may just want to monitor you  with yearly ultrasound exams. Sometimes cysts need to be removed surgically. Document Released: 07/04/2004 Document Revised: 07/31/2011 Document Reviewed: 10/17/2010 [facility] Patient Information 2012 [place], [place].

## 2012-05-18 NOTE — Progress Notes (Signed)
Kara Fernandez 11-06-1990 [id number]   History:    21 y.o.  for annual gyn exam who wanted discuss contraceptive options. She is a gravida 2 para 1 AB 1 (elective termination) who had a cesarean section may of this year for fetal distress. She is interested in going on the Depo-Provera injectable contraception. She freely does her self breast examination. Her cycles are regular. 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  Patient completed Gardasil Vaccine series in 2008  Atypical squamous cells of undetermined significance with colposcopic directed biopsy confirming CIN-1 in 2009 with normal followup Pap smears since then.  Past medical history,surgical history, family history and social history were all reviewed and documented in the EPIC chart.  Gynecologic History Patient's last menstrual period was 05/03/2012. Contraception: condoms Last Pap: 2011. Results were: normal Last mammogram: Not indicated. Results were: Not indicated  Obstetric History OB History    Grav Para Term Preterm Abortions TAB SAB Ect Mult Living   2 1 1  1 1    1      # Outc Date GA Lbr Len/2nd Wgt Sex Del Anes PTL Lv   1 TAB 2010 [redacted]w[redacted]d          Comments: no complications    [place]   2 TRM 5/13 [redacted]w[redacted]d 00:00 7lb7.8oz(3.395kg) M LTCS EPI  Yes       ROS: A ROS was performed and pertinent positives and negatives are included in the history.  GENERAL: No fevers or chills. HEENT: No change in vision, no earache, sore throat or sinus congestion. NECK: No pain or stiffness. CARDIOVASCULAR: No chest pain or pressure. No palpitations. PULMONARY: No shortness of breath, cough or wheeze. GASTROINTESTINAL: No abdominal pain, nausea, vomiting or diarrhea, melena or bright red  blood per rectum. GENITOURINARY: No urinary frequency, urgency, hesitancy or dysuria. MUSCULOSKELETAL: No joint or muscle pain, no back pain, no recent trauma. DERMATOLOGIC: No rash, no itching, no lesions. ENDOCRINE: No polyuria, polydipsia, no heat or cold intolerance. No recent change in weight. HEMATOLOGICAL: No anemia or easy bruising or bleeding. NEUROLOGIC: No headache, seizures, numbness, tingling or weakness. PSYCHIATRIC: No depression, no loss of interest in normal activity or change in sleep pattern.     Exam: chaperone present  BP 126/78  Ht 5\' 1"  (1.549 m)  Wt 137 lb (62.143 kg)  BMI 25.89 kg/m2  LMP 05/03/2012  Body mass index is 25.89 kg/(m^2).  General appearance : Well developed well nourished female. No acute distress HEENT: Neck supple, trachea midline, no carotid bruits, thyromegaly was evident. Lungs: Clear to auscultation, no rhonchi or wheezes, or rib retractions  Heart: Regular rate and rhythm, no murmurs or gallops Breast:Examined in sitting and supine position were symmetrical in appearance, no palpable masses or tenderness,  no skin retraction, no nipple inversion, no nipple discharge, no skin discoloration, no axillary or supraclavicular lymphadenopathy Abdomen: no palpable masses or tenderness, no rebound or guarding Extremities: no edema or skin discoloration or tenderness  Pelvic:  Bartholin, Urethra, Skene Glands: Within normal limits             Vagina: No gross lesions or discharge  Cervix: No gross lesions or discharge  Uterus  anteverted, normal size, shape and consistency, non-tender and mobile  Adnexa  Without masses or tenderness  Anus  and perineum  normal   Rectovaginal  normal sphincter tone without palpated masses or tenderness             Hemoccult not indicated     Assessment/Plan:  21 y.o. female for annual exam with multiple STDs. GC and Chlamydia culture along with her Pap smear was done today. Patient with noted thyromegaly. Review of  her record indicated in 2011 she had a thyroid ultrasound whereby thyroid cysts were seen and she had been referred to Dr. Lurene Shadow endocrinologist and only saw her one time and did not followup. We'll check thyroid function test today along with her CBC and urinalysis. We will schedule her for a thyroid scan and also to followup with Dr. Lurene Shadow. She will return back to the office when her cycle begins so that she can come in for the Depo-Provera injection 150 mg IM which she will receive every 3 months. Risk benefits and pros and cons were discussed. Patient received the flu vaccine today.    Ok Edwards MD, 6:24 PM 05/18/2012

## 2012-05-19 LAB — GC/CHLAMYDIA PROBE AMP, GENITAL
Chlamydia, DNA Probe: NEGATIVE
GC Probe Amp, Genital: NEGATIVE

## 2012-05-19 LAB — URINALYSIS W MICROSCOPIC + REFLEX CULTURE
Bilirubin Urine: NEGATIVE
Casts: NONE SEEN
Glucose, UA: NEGATIVE mg/dL
Hgb urine dipstick: NEGATIVE
Ketones, ur: NEGATIVE mg/dL
Protein, ur: NEGATIVE mg/dL

## 2012-05-20 ENCOUNTER — Telehealth: Payer: Self-pay | Admitting: *Deleted

## 2012-05-20 NOTE — Telephone Encounter (Signed)
Message copied by Aura Camps on Thu May 20, 2012  8:57 AM ------      Message from: Ok Edwards      Created: Tue May 18, 2012  6:31 PM       Victorino Dike, patient needs an appointment with Dr. Lurene Shadow who she saw several years ago because of thyromegaly. She also needs a thyroid ultrasound which I had placed the order in the computer if you would let her know where she needs to go and when to have the scan and then to followup with Dr. Lurene Shadow. Please forward Dr. Lurene Shadow a copy of my office note and labs. Thank you

## 2012-05-20 NOTE — Telephone Encounter (Signed)
Ultrasound appointment on 05/24/12 @ 11:00 am. Notes faxed to dr. Talmage Nap office.

## 2012-05-21 NOTE — Telephone Encounter (Signed)
Dr.Balan appointment on 06/15/12 @ 2:00 pm, pt informed with time and date.

## 2012-05-24 ENCOUNTER — Ambulatory Visit (HOSPITAL_COMMUNITY): Payer: 59

## 2012-05-24 ENCOUNTER — Encounter: Payer: Self-pay | Admitting: Gynecology

## 2012-05-24 ENCOUNTER — Telehealth: Payer: Self-pay | Admitting: *Deleted

## 2012-05-24 NOTE — Telephone Encounter (Signed)
Pt called and cancelled [place] due to financially not being able to do it before Nov. R/s for Nov 4th at 1:15 arrive at 1pm. Pt informed. KW

## 2012-06-07 ENCOUNTER — Telehealth: Payer: Self-pay | Admitting: *Deleted

## 2012-06-07 NOTE — Telephone Encounter (Signed)
Pt calling requesting her birth control depo provera sent to pharmacy. Left message on vm call back and leave the name of the rx.

## 2012-06-08 MED ORDER — MEDROXYPROGESTERONE ACETATE 150 MG/ML IM SUSP: 150.0000 mg | Freq: Once | INTRAMUSCULAR | Status: DC

## 2012-06-08 NOTE — Telephone Encounter (Signed)
Pt leave message with # to pharmacy, but the # given was a d/c #. Left message on vm that # not working.

## 2012-06-08 NOTE — Telephone Encounter (Signed)
rx called in to [facility] in [place] [phone]

## 2012-06-11 ENCOUNTER — Telehealth: Payer: Self-pay | Admitting: *Deleted

## 2012-06-11 NOTE — Telephone Encounter (Signed)
Pt called requesting refill on BV medication. I explained to pt that we dont give BV medication over the phone. Pt has moved and will follow up at urgent in her city.

## 2012-06-28 ENCOUNTER — Ambulatory Visit (HOSPITAL_COMMUNITY): Payer: 59

## 2012-09-16 ENCOUNTER — Ambulatory Visit: Payer: 59 | Admitting: Gynecology

## 2012-09-17 ENCOUNTER — Other Ambulatory Visit: Payer: Self-pay

## 2012-09-17 MED ORDER — MEDROXYPROGESTERONE ACETATE 150 MG/ML IM SUSP: 150.0000 mg | Freq: Once | INTRAMUSCULAR | Status: DC

## 2012-11-25 ENCOUNTER — Inpatient Hospital Stay (HOSPITAL_COMMUNITY)
Admission: AD | Admit: 2012-11-25 | Discharge: 2012-11-25 | Disposition: A | Discharge status: Home / Self Care | Payer: Self-pay | Source: Ambulatory Visit | Attending: Obstetrics & Gynecology | Admitting: Obstetrics & Gynecology

## 2012-11-25 ENCOUNTER — Encounter (HOSPITAL_COMMUNITY): Payer: Self-pay | Admitting: *Deleted

## 2012-11-25 DIAGNOSIS — N939 Abnormal uterine and vaginal bleeding, unspecified: Secondary | ICD-10-CM

## 2012-11-25 DIAGNOSIS — N760 Acute vaginitis: Secondary | ICD-10-CM

## 2012-11-25 DIAGNOSIS — A499 Bacterial infection, unspecified: Secondary | ICD-10-CM

## 2012-11-25 DIAGNOSIS — B9689 Other specified bacterial agents as the cause of diseases classified elsewhere: Secondary | ICD-10-CM

## 2012-11-25 DIAGNOSIS — N949 Unspecified condition associated with female genital organs and menstrual cycle: Secondary | ICD-10-CM | POA: Insufficient documentation

## 2012-11-25 LAB — WET PREP, GENITAL
Trich, Wet Prep: NONE SEEN
Yeast Wet Prep HPF POC: NONE SEEN

## 2012-11-25 MED ORDER — METRONIDAZOLE 500 MG PO TABS: 500.0000 mg | ORAL_TABLET | Freq: Two times a day (BID) | ORAL | Status: AC

## 2012-11-25 NOTE — MAU Provider Note (Signed)
History     CSN: [id number]  Arrival date and time: 11/25/12 1202    Chief Complaint  Patient presents with  . Vaginal Discharge   HPI Kara Fernandez  is a 22 y.o. female who presents with dark brown Vaginal discharge that started 3 weeks ago. She states that the discharge lasts for approximately 2 days and will go away for 1-2 days. Denies any pain or odor. States she has had yeast infections and BV before. States the discharge has never been this color before. She states she got her depo shot a month late in February instead of January.   Has not been sexual active within the last month. Sexual activity with 1 partner prior.      OB History   Grav Para Term Preterm Abortions TAB SAB Ect Mult Living   2 1 1  1 1    1       Past Medical History  Diagnosis Date  . Chlamydia infection     POSITIVE 10/06,8/07,7/09,11/11 ;at age 44  . ASCUS on Pap smear     C&B 10/09 W/LGSIL  . HSV (herpes simplex virus) infection   . 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 Gonorrhea     POSITIVE 8/06, 10/06; at age 71  . GC (gonococcus infection)   . Trichomonal vulvovaginitis   . Ovarian cyst   . UTI (lower urinary tract infection)   . 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 H/O varicella   . History of high blood pressure   . Yeast infection   . H/O bacterial infection     frequently  . Abnormal Pap smear     completed  at age 40  . Asthma     as a child   . Angina ~2000    enlarged heart, pain due to stress  . Ovarian cyst     Past Surgical History  Procedure Laterality Date  . Dilation and curettage of uterus      abortion  . Cesarean section  12/29/2011    Procedure: CESAREAN SECTION;  Surgeon: Purcell Nails, MD;  Location: [facility];  Service: Gynecology;  Laterality: N/A;    Family History  Problem Relation Age of Onset  . Hypertension Mother   . Hypertension Father   . Hypertension Maternal Grandmother   .  Stroke Maternal Grandmother   . Hypertension Maternal Grandfather   . Hypertension Maternal Aunt   . Hypertension Maternal Uncle   . Alcohol abuse Maternal Uncle   . Alcohol abuse Paternal Uncle     History  Substance Use Topics  . Smoking status: Never Smoker   . Smokeless tobacco: Never Used  . Alcohol Use: 0.6 oz/week    1 Shots of liquor per week     Comment: occasionally    Allergies: No Known Allergies  Prescriptions prior to admission  Medication Sig Dispense Refill  . medroxyPROGESTERone (DEPO-PROVERA) 150 MG/ML injection Inject 150 mg into the muscle every 3 (three) months.      . valACYclovir (VALTREX) 500 MG tablet Take 500 mg by mouth daily as needed (for outbreak).      . [DISCONTINUED] medroxyPROGESTERone (DEPO-PROVERA) 150 MG/ML injection Inject 1 mL (150 mg total) into the muscle once.  1 mL  2    Review of Systems  Constitutional: Negative for fever and chills.  Respiratory: Negative.   Cardiovascular: Negative.  Gastrointestinal: Negative for nausea, vomiting, abdominal pain, diarrhea, constipation and blood in stool.  Genitourinary: Negative for dysuria, urgency, frequency and hematuria.  Neurological: Positive for headaches.   Physical Exam   Blood pressure 125/72, pulse 119, temperature 98.1 F (36.7 C), temperature source Oral, resp. rate 18, height 5\' 2"  (1.575 m), weight 65.227 kg (143 lb 12.8 oz), SpO2 100.00%.  Physical Exam  Constitutional: She is oriented to person, place, and time. She appears well-developed and well-nourished.  HENT:  Head: Normocephalic and atraumatic.  Eyes: EOM are normal.  Cardiovascular: Normal rate, regular rhythm, normal heart sounds and intact distal pulses.  Exam reveals no gallop and no friction rub.   No murmur heard. Respiratory: Effort normal and breath sounds normal. No respiratory distress. She has no wheezes. She has no rales.  GI: Soft. Bowel sounds are normal. She exhibits no distension and no mass. There  is no tenderness. There is no rebound and no guarding.  Genitourinary: Uterus normal. Vaginal discharge (scant amount of dark, thin red discharge) found.  No cervical motion tenderness, no masses   Neurological: She is alert and oriented to person, place, and time.  Skin: Skin is warm and dry.  Psychiatric: She has a normal mood and affect.   Urine Pregnancy: negative  Microscopic wet-mount exam shows clue cells, white blood cells.  MAU Course  Procedures  MDM Speculum exam, bimanual eam. GC/Ch, wet prep obtained  Assessment and Plan  A: bacterial vaginosis  P: Flagyl 500mg  BID x 7 days.  Will go to [facility] for f/u    Rosalee Kaufman 11/25/2012, 3:41 PM   I saw and examined patient and agree with above student note. I was present for speculum exam and agree with findings. I reviewed history, labs, and vitals. I agree with above student plan. Likely irregular bleeding due to Depo Provera (just restarted shots). Napoleon Form, MD

## 2012-11-25 NOTE — MAU Note (Signed)
Past month had a dk  Brown d/c, off and on- no odor, no irritation.  Depo was late- due in Jan, received in Feb.

## 2012-11-25 NOTE — MAU Note (Signed)
Patient states she last had Depo in February. States when she got the shot she was late for her period and did not have a pregnancy test before getting the shot. States she has been having a brown discharge off and on. Denies any pain.

## 2012-11-29 NOTE — MAU Provider Note (Signed)
Attestation of Attending Supervision of Advanced Practitioner (CNM/NP): Evaluation and management procedures were performed by the Advanced Practitioner under my supervision and collaboration.  I have reviewed the Advanced Practitioner's note and chart, and I agree with the management and plan.  HARRAWAY-SMITH, Gabrella Stroh 7:14 PM     

## 2013-08-16 ENCOUNTER — Telehealth: Payer: Self-pay | Admitting: *Deleted

## 2013-08-16 MED ORDER — VALACYCLOVIR HCL 500 MG PO TABS: 500.0000 mg | ORAL_TABLET | Freq: Every day | ORAL | Status: DC | PRN

## 2013-08-16 NOTE — Telephone Encounter (Signed)
Pt called c/o HSV outbreak requesting refill on valtrex, pt overdue for annual last seen in Sept. 2013. I explained to pt that annual will need to be scheduled. rx sent.

## 2013-08-28 ENCOUNTER — Encounter (HOSPITAL_COMMUNITY): Payer: Self-pay | Admitting: *Deleted

## 2013-08-28 ENCOUNTER — Inpatient Hospital Stay (HOSPITAL_COMMUNITY)
Admission: AD | Admit: 2013-08-28 | Discharge: 2013-08-28 | Disposition: A | Discharge status: Home / Self Care | Payer: Medicaid Other | Source: Ambulatory Visit | Attending: Family Medicine | Admitting: Family Medicine

## 2013-08-28 DIAGNOSIS — R102 Pelvic and perineal pain: Secondary | ICD-10-CM

## 2013-08-28 DIAGNOSIS — N949 Unspecified condition associated with female genital organs and menstrual cycle: Secondary | ICD-10-CM

## 2013-08-28 DIAGNOSIS — R109 Unspecified abdominal pain: Secondary | ICD-10-CM | POA: Insufficient documentation

## 2013-08-28 LAB — URINALYSIS, ROUTINE W REFLEX MICROSCOPIC
BILIRUBIN URINE: NEGATIVE
GLUCOSE, UA: NEGATIVE mg/dL
HGB URINE DIPSTICK: NEGATIVE
Ketones, ur: NEGATIVE mg/dL
Leukocytes, UA: NEGATIVE
NITRITE: NEGATIVE
PH: 8 (ref 5.0–8.0)
Protein, ur: NEGATIVE mg/dL
SPECIFIC GRAVITY, URINE: 1.02 (ref 1.005–1.030)
Urobilinogen, UA: 0.2 mg/dL (ref 0.0–1.0)

## 2013-08-28 LAB — WET PREP, GENITAL
CLUE CELLS WET PREP: NONE SEEN
Trich, Wet Prep: NONE SEEN
Yeast Wet Prep HPF POC: NONE SEEN

## 2013-08-28 LAB — POCT PREGNANCY, URINE: Preg Test, Ur: NEGATIVE

## 2013-08-28 NOTE — Discharge Instructions (Signed)
If you have not had a period in another week, take a home pregnancy test. If it is positive and you are having pain, return to [facility]. If you do not have a period for more than 3 months and are not pregnant, you should follow up with a regular gynacologist for further evaluation.  Pelvic Pain, Female Female pelvic pain can be caused by many different things and start from a variety of places. Pelvic pain refers to pain that is located in the lower half of the abdomen and between your hips. The pain may occur over a short period of time (acute) or may be reoccurring (chronic). The cause of pelvic pain may be related to disorders affecting the female reproductive organs (gynecologic), but it may also be related to the bladder, kidney stones, an intestinal complication, or muscle or skeletal problems. Getting help right away for pelvic pain is important, especially if there has been severe, sharp, or a sudden onset of unusual pain. It is also important to get help right away because some types of pelvic pain can be life threatening.  CAUSES  Below are only some of the causes of pelvic pain. The causes of pelvic pain can be in one of several categories.   Gynecologic.  Pelvic inflammatory disease.  Sexually transmitted infection.  Ovarian cyst or a twisted ovarian ligament (ovarian torsion).  Uterine lining that grows outside the uterus (endometriosis).  Fibroids, cysts, or tumors.  Ovulation.  Pregnancy.  Pregnancy that occurs outside the uterus (ectopic pregnancy).  Miscarriage.  Labor.  Abruption of the placenta or ruptured uterus.  Infection.  Uterine infection (endometritis).  Bladder infection.  Diverticulitis.  Miscarriage related to a uterine infection (septic abortion).  Bladder.  Inflammation of the bladder (cystitis).  Kidney stone(s).  Gastrointenstinal.  Constipation.  Diverticulitis.  Neurologic.  Trauma.  Feeling pelvic pain because of mental or  emotional causes (psychosomatic).  Cancers of the bowel or pelvis. EVALUATION  Your caregiver will want to take a careful history of your concerns. This includes recent changes in your health, a careful gynecologic history of your periods (menses), and a sexual history. Obtaining your family history and medical history is also important. Your caregiver may suggest a pelvic exam. A pelvic exam will help identify the location and severity of the pain. It also helps in the evaluation of which organ system may be involved. In order to identify the cause of the pelvic pain and be properly treated, your caregiver may order tests. These tests may include:   A pregnancy test.  Pelvic ultrasonography.  An X-ray exam of the abdomen.  A urinalysis or evaluation of vaginal discharge.  Blood tests. HOME CARE INSTRUCTIONS   Only take over-the-counter or prescription medicines for pain, discomfort, or fever as directed by your caregiver.   Rest as directed by your caregiver.   Eat a balanced diet.   Drink enough fluids to make your urine clear or pale yellow, or as directed.   Avoid sexual intercourse if it causes pain.   Apply warm or cold compresses to the lower abdomen depending on which one helps the pain.   Avoid stressful situations.   Keep a journal of your pelvic pain. Write down when it started, where the pain is located, and if there are things that seem to be associated with the pain, such as food or your menstrual cycle.  Follow up with your caregiver as directed.  SEEK MEDICAL CARE IF:  Your medicine does not help your pain.  You have abnormal vaginal discharge. SEEK IMMEDIATE MEDICAL CARE IF:   You have heavy bleeding from the vagina.   Your pelvic pain increases.   You feel lightheaded or faint.   You have chills.   You have pain with urination or blood in your urine.   You have uncontrolled diarrhea or vomiting.   You have a fever or persistent  symptoms for more than 3 days.  You have a fever and your symptoms suddenly get worse.   You are being physically or sexually abused.  MAKE SURE YOU:  Understand these instructions.  Will watch your condition.  Will get help if you are not doing well or get worse. Document Released: 07/08/2004 Document Revised: 02/10/2012 Document Reviewed: 12/01/2011 [facility] Patient Information 2014 [place], [place].

## 2013-08-28 NOTE — MAU Note (Signed)
07/25/13 Had normal menses.  Started having normal cramping around time for menstrual cycle,but no cycle started.  Having normal period symptoms (headaches).  Started noticing dizzy spells for about 2 weeks after standing or during working [employer]).

## 2013-08-28 NOTE — MAU Provider Note (Signed)
History     CSN: [id number]  Arrival date and time: 08/28/13 1254   First Provider Initiated Contact with Patient 08/28/13 1340      Chief Complaint  Patient presents with  . Abdominal Pain   HPI 23 y.o. [id number] with crampy pelvic pain, denies discharge or bleeding. Patient's last menstrual period was 07/25/2013. Expected period about 1 week ago. Pain feels like menstrual cramping, but no bleeding yet.   Past Medical History  Diagnosis Date  . Chlamydia infection     POSITIVE 10/06,8/07,7/09,11/11 ;at age 58  . ASCUS on Pap smear     C&B 10/09 W/LGSIL  . HSV (herpes simplex virus) infection   . 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 Gonorrhea     POSITIVE 8/06, 10/06; at age 71  . GC (gonococcus infection)   . Trichomonal vulvovaginitis   . Ovarian cyst   . UTI (lower urinary tract infection)   . 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 H/O varicella   . History of high blood pressure   . Yeast infection   . H/O bacterial infection     frequently  . Abnormal Pap smear     completed  at age 48  . Asthma     as a child   . Angina ~2000    enlarged heart, pain due to stress  . Ovarian cyst     Past Surgical History  Procedure Laterality Date  . Dilation and curettage of uterus      abortion  . Cesarean section  12/29/2011    Procedure: CESAREAN SECTION;  Surgeon: Purcell Nails, MD;  Location: [facility];  Service: Gynecology;  Laterality: N/A;    Family History  Problem Relation Age of Onset  . Hypertension Mother   . Hypertension Father   . Hypertension Maternal Grandmother   . Stroke Maternal Grandmother   . Hypertension Maternal Grandfather   . Hypertension Maternal Aunt   . Hypertension Maternal Uncle   . Alcohol abuse Maternal Uncle   . Alcohol abuse Paternal Uncle     History  Substance Use Topics  . Smoking status: Never Smoker   . Smokeless tobacco: Never Used  . Alcohol Use: 0.6 oz/week    1  Shots of liquor per week     Comment: occasionally    Allergies: No Known Allergies  No prescriptions prior to admission    Review of Systems  Constitutional: Negative.   Respiratory: Negative.   Cardiovascular: Negative.   Gastrointestinal: Negative for nausea, vomiting, abdominal pain, diarrhea and constipation.  Genitourinary: Negative for dysuria, urgency, frequency, hematuria and flank pain.       Negative for vaginal bleeding, vaginal discharge, + cramping   Musculoskeletal: Negative.   Neurological: Negative.   Psychiatric/Behavioral: Negative.    Physical Exam   Blood pressure 103/78, pulse 86, temperature 98.1 F (36.7 C), temperature source Oral, resp. rate 18, height 5\' 1"  (1.549 m), weight 145 lb (65.772 kg), last menstrual period 07/25/2013. Patient Vitals for the past 24 hrs:  BP Temp Temp src Pulse Resp Height Weight  08/28/13 1413 103/78 mmHg - - - - - -  08/28/13 1318 140/73 mmHg 98.1 F (36.7 C) Oral 86 18 5\' 1"  (1.549 m) 145 lb (65.772 kg)    Physical Exam  Nursing note and vitals reviewed. Constitutional: She is oriented to person, place, and time. She appears  well-developed and well-nourished. No distress.  HENT:  Head: Normocephalic and atraumatic.  Cardiovascular: Normal rate.   Respiratory: Effort normal. No respiratory distress.  GI: Soft. She exhibits no distension and no mass. There is no tenderness. There is no rebound and no guarding.  Genitourinary: There is no rash or lesion on the right labia. There is no rash or lesion on the left labia. Uterus is not deviated, not enlarged, not fixed and not tender. Cervix exhibits no motion tenderness, no discharge and no friability. Right adnexum displays no mass, no tenderness and no fullness. Left adnexum displays no mass, no tenderness and no fullness. No erythema, tenderness or bleeding around the vagina. Vaginal discharge (thick, white, nonmalodorous) found.  Neurological: She is alert and oriented to  person, place, and time.  Skin: Skin is warm and dry.  Psychiatric: She has a normal mood and affect.    MAU Course  Procedures Results for orders placed during the hospital encounter of 08/28/13 (from the past 24 hour(s))  URINALYSIS, ROUTINE W REFLEX MICROSCOPIC     Status: None   Collection Time    08/28/13  1:00 PM      Result Value Range   Color, Urine YELLOW  YELLOW   APPearance CLEAR  CLEAR   Specific Gravity, Urine 1.020  1.005 - 1.030   pH 8.0  5.0 - 8.0   Glucose, UA NEGATIVE  NEGATIVE mg/dL   Hgb urine dipstick NEGATIVE  NEGATIVE   Bilirubin Urine NEGATIVE  NEGATIVE   Ketones, ur NEGATIVE  NEGATIVE mg/dL   Protein, ur NEGATIVE  NEGATIVE mg/dL   Urobilinogen, UA 0.2  0.0 - 1.0 mg/dL   Nitrite NEGATIVE  NEGATIVE   Leukocytes, UA NEGATIVE  NEGATIVE  POCT PREGNANCY, URINE     Status: None   Collection Time    08/28/13  1:10 PM      Result Value Range   Preg Test, Ur NEGATIVE  NEGATIVE  WET PREP, GENITAL     Status: Abnormal   Collection Time    08/28/13  1:50 PM      Result Value Range   Yeast Wet Prep HPF POC NONE SEEN  NONE SEEN   Trich, Wet Prep NONE SEEN  NONE SEEN   Clue Cells Wet Prep HPF POC NONE SEEN  NONE SEEN   WBC, Wet Prep HPF POC FEW (*) NONE SEEN     Assessment and Plan   1. Pelvic cramping   Repeat home pregnancy test in 1 week if still no menses, f/u outpatient if no menses for 3 months and negative UPT. Return to MAU with + UPT and ongoing pain or bleeding.     Medication List         valACYclovir 500 MG tablet  Commonly known as:  VALTREX  Take 500 mg by mouth daily as needed (for outbreak).        Follow-up Information   [facility] ([facility] needed)    Specialty:  Obstetrics and Gynecology   Contact information:   [address] [place] [id number] [phone]        Kara Fernandez,Kara Fernandez 08/28/2013, 6:10 PM

## 2013-08-29 LAB — GC/CHLAMYDIA PROBE AMP
CT PROBE, AMP APTIMA: NEGATIVE
GC PROBE AMP APTIMA: NEGATIVE

## 2013-08-30 NOTE — MAU Provider Note (Signed)
Attestation of Attending Supervision of Advanced Practitioner (CNM/NP): Evaluation and management procedures were performed by the Advanced Practitioner under my supervision and collaboration. I have reviewed the Advanced Practitioner's note and chart, and I agree with the management and plan.  Kara Fernandez H. 4:22 PM   

## 2014-01-06 ENCOUNTER — Emergency Department (HOSPITAL_COMMUNITY)
Admission: EM | Admit: 2014-01-06 | Discharge: 2014-01-06 | Disposition: A | Discharge status: Home / Self Care | Payer: Medicaid Other | Attending: Emergency Medicine | Admitting: Emergency Medicine

## 2014-01-06 ENCOUNTER — Encounter (HOSPITAL_COMMUNITY): Payer: Self-pay | Admitting: Emergency Medicine

## 2014-01-06 ENCOUNTER — Emergency Department (HOSPITAL_COMMUNITY): Payer: Medicaid Other

## 2014-01-06 DIAGNOSIS — R197 Diarrhea, unspecified: Secondary | ICD-10-CM

## 2014-01-06 DIAGNOSIS — R51 Headache: Secondary | ICD-10-CM

## 2014-01-06 DIAGNOSIS — R059 Cough, unspecified: Secondary | ICD-10-CM

## 2014-01-06 DIAGNOSIS — R52 Pain, unspecified: Secondary | ICD-10-CM | POA: Insufficient documentation

## 2014-01-06 DIAGNOSIS — Z8619 Personal history of other infectious and parasitic diseases: Secondary | ICD-10-CM | POA: Insufficient documentation

## 2014-01-06 DIAGNOSIS — J45909 Unspecified asthma, uncomplicated: Secondary | ICD-10-CM | POA: Insufficient documentation

## 2014-01-06 DIAGNOSIS — J069 Acute upper respiratory infection, unspecified: Secondary | ICD-10-CM

## 2014-01-06 DIAGNOSIS — Z3202 Encounter for pregnancy test, result negative: Secondary | ICD-10-CM | POA: Insufficient documentation

## 2014-01-06 DIAGNOSIS — Z87440 Personal history of urinary (tract) infections: Secondary | ICD-10-CM | POA: Insufficient documentation

## 2014-01-06 DIAGNOSIS — Z79899 Other long term (current) drug therapy: Secondary | ICD-10-CM | POA: Insufficient documentation

## 2014-01-06 DIAGNOSIS — Z8742 Personal history of other diseases of the female genital tract: Secondary | ICD-10-CM | POA: Insufficient documentation

## 2014-01-06 DIAGNOSIS — R05 Cough: Secondary | ICD-10-CM

## 2014-01-06 DIAGNOSIS — R519 Headache, unspecified: Secondary | ICD-10-CM

## 2014-01-06 DIAGNOSIS — I209 Angina pectoris, unspecified: Secondary | ICD-10-CM | POA: Insufficient documentation

## 2014-01-06 LAB — COMPREHENSIVE METABOLIC PANEL
ALBUMIN: 3.7 g/dL (ref 3.5–5.2)
ALT: 9 U/L (ref 0–35)
AST: 14 U/L (ref 0–37)
Alkaline Phosphatase: 80 U/L (ref 39–117)
BILIRUBIN TOTAL: 0.5 mg/dL (ref 0.3–1.2)
BUN: 6 mg/dL (ref 6–23)
CHLORIDE: 103 meq/L (ref 96–112)
CO2: 24 meq/L (ref 19–32)
CREATININE: 0.75 mg/dL (ref 0.50–1.10)
Calcium: 9.1 mg/dL (ref 8.4–10.5)
GFR calc Af Amer: >90 mL/min (ref >90)
Glucose, Bld: 107 mg/dL — ABNORMAL HIGH (ref 70–99)
Potassium: 3.7 mEq/L (ref 3.7–5.3)
Sodium: 140 mEq/L (ref 137–147)
Total Protein: 7.6 g/dL (ref 6.0–8.3)

## 2014-01-06 LAB — CBC WITH DIFFERENTIAL/PLATELET
Basophils Absolute: 0 10*3/uL (ref 0.0–0.1)
Basophils Relative: 0 % (ref 0–1)
Eosinophils Absolute: 0 10*3/uL (ref 0.0–0.7)
Eosinophils Relative: 0 % (ref 0–5)
HCT: 39.6 % (ref 36.0–46.0)
Hemoglobin: 12.9 g/dL (ref 12.0–15.0)
LYMPHS ABS: 1.9 10*3/uL (ref 0.7–4.0)
LYMPHS PCT: 15 % (ref 12–46)
MCH: 22.9 pg — ABNORMAL LOW (ref 26.0–34.0)
MCHC: 32.6 g/dL (ref 30.0–36.0)
MCV: 70.2 fL — ABNORMAL LOW (ref 78.0–100.0)
Monocytes Absolute: 1.1 10*3/uL — ABNORMAL HIGH (ref 0.1–1.0)
Monocytes Relative: 8 % (ref 3–12)
NEUTROS PCT: 77 % (ref 43–77)
Neutro Abs: 10 10*3/uL — ABNORMAL HIGH (ref 1.7–7.7)
PLATELETS: 314 10*3/uL (ref 150–400)
RBC: 5.64 MIL/uL — AB (ref 3.87–5.11)
RDW: 15.3 % (ref 11.5–15.5)
WBC: 13 10*3/uL — AB (ref 4.0–10.5)

## 2014-01-06 LAB — URINE MICROSCOPIC-ADD ON

## 2014-01-06 LAB — URINALYSIS, ROUTINE W REFLEX MICROSCOPIC
Bilirubin Urine: NEGATIVE
GLUCOSE, UA: NEGATIVE mg/dL
Ketones, ur: 15 mg/dL — AB
LEUKOCYTES UA: NEGATIVE
Nitrite: NEGATIVE
PH: 5.5 (ref 5.0–8.0)
Protein, ur: NEGATIVE mg/dL
Specific Gravity, Urine: 1.028 (ref 1.005–1.030)
Urobilinogen, UA: 0.2 mg/dL (ref 0.0–1.0)

## 2014-01-06 LAB — POC URINE PREG, ED: PREG TEST UR: NEGATIVE

## 2014-01-06 MED ORDER — SODIUM CHLORIDE 0.9 % IV BOLUS (SEPSIS)
1000.0000 mL | Freq: Once | INTRAVENOUS | Status: AC
  Administered 2014-01-06: 1000 mL via INTRAVENOUS

## 2014-01-06 MED ORDER — DIPHENHYDRAMINE HCL 50 MG/ML IJ SOLN
25.0000 mg | Freq: Once | INTRAMUSCULAR | Status: AC
  Administered 2014-01-06: 25 mg via INTRAVENOUS
  Filled 2014-01-06: qty 1

## 2014-01-06 MED ORDER — METOCLOPRAMIDE HCL 5 MG/ML IJ SOLN
10.0000 mg | Freq: Once | INTRAMUSCULAR | Status: AC
  Administered 2014-01-06: 10 mg via INTRAVENOUS
  Filled 2014-01-06: qty 2

## 2014-01-06 NOTE — ED Notes (Signed)
Pt ambulated to restroom with no difficulties

## 2014-01-06 NOTE — ED Notes (Signed)
Pt c/o migraine and diarrhea that started yesterday. Denies n/v. sts she gets HAs often but not migraines. Pt reports she was also having bilateral ear pain last night. sts she hasn't been drinking much fluid lately. Denies eating any new/different foods. Denies blood in stools. Nad, skin warm and dry, resp e/u.

## 2014-01-06 NOTE — ED Notes (Signed)
Pt reports diarrhea and body aches x 2 days

## 2014-01-06 NOTE — ED Provider Notes (Signed)
CSN: [id number]     Arrival date & time 01/06/14  1025 History   First MD Initiated Contact with Patient 01/06/14 1111     Chief Complaint  Patient presents with  . Diarrhea  . Generalized Body Aches    HPI  Kara Fernandez is a 23 y.o. female with a PMH of HTN, asthma, ovarian cyst, abnormal pap, herpes, trichomonas, gonorrhea, chlamydia, and HPV who presents to the ED for evaluation of diarrhea and body aches. History was provided by the patient. Patient states she has had diarrhea for the past two days. Described as watery and brown with no hematochezia. She denies any abdominal pain, rectal pain, emesis, rectal bleeding, pelvic pain, dysuria, hematuria, or vaginal discharge. She is just finishing her menstrual cycle. She also has had nasal congestion and a frontal pressure headache. She also has had a mild intermittent non-productive cough. No fevers, chills, change in appetite/activity, SOB, or chest pain. Has also had bilateral ear pressure. No drainage, tinnitus, dizziness, lightheadedness, or hearing loss. Patient has been taking Ibuprofen to treat her symptoms. She also has had fatigue and generalized weakness. Sick contacts include her son who has similar symptoms.    Past Medical History  Diagnosis Date  . Chlamydia infection     POSITIVE 10/06,8/07,7/09,11/11 ;at age 29  . ASCUS on Pap smear     C&B 10/09 W/LGSIL  . HSV (herpes simplex virus) infection   . 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 Gonorrhea     POSITIVE 8/06, 10/06; at age 24  . GC (gonococcus infection)   . Trichomonal vulvovaginitis   . Ovarian cyst   . UTI (lower urinary tract infection)   . 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 H/O varicella   . History of high blood pressure   . Yeast infection   . H/O bacterial infection     frequently  . Abnormal Pap smear     completed  at age 46  . Asthma     as a child   . Angina ~2000    enlarged  heart, pain due to stress  . Ovarian cyst    Past Surgical History  Procedure Laterality Date  . Dilation and curettage of uterus      abortion  . Cesarean section  12/29/2011    Procedure: CESAREAN SECTION;  Surgeon: Purcell Nails, MD;  Location: [facility];  Service: Gynecology;  Laterality: N/A;   Family History  Problem Relation Age of Onset  . Hypertension Mother   . Hypertension Father   . Hypertension Maternal Grandmother   . Stroke Maternal Grandmother   . Hypertension Maternal Grandfather   . Hypertension Maternal Aunt   . Hypertension Maternal Uncle   . Alcohol abuse Maternal Uncle   . Alcohol abuse Paternal Uncle    History  Substance Use Topics  . Smoking status: Never Smoker   . Smokeless tobacco: Never Used  . Alcohol Use: 0.6 oz/week    1 Shots of liquor per week     Comment: occasionally   OB History   Grav Para Term Preterm Abortions TAB SAB Ect Mult Living   2 1 1  1 1    1      Review of Systems  Constitutional: Positive for fatigue. Negative for fever, chills, diaphoresis, activity change and appetite change.  HENT: Positive for congestion, ear pain and sinus  pressure. Negative for ear discharge, facial swelling, mouth sores, rhinorrhea, sore throat and trouble swallowing.   Eyes: Negative for photophobia and visual disturbance.  Respiratory: Positive for cough. Negative for shortness of breath.   Cardiovascular: Negative for chest pain and leg swelling.  Gastrointestinal: Positive for diarrhea. Negative for nausea, vomiting, abdominal pain, constipation, blood in stool, anal bleeding and rectal pain.  Genitourinary: Positive for vaginal bleeding. Negative for dysuria, hematuria, vaginal discharge, difficulty urinating, vaginal pain, menstrual problem and pelvic pain.  Musculoskeletal: Positive for myalgias. Negative for back pain.  Skin: Negative for color change and rash.  Neurological: Positive for weakness (generalized) and headaches. Negative for  dizziness, syncope, light-headedness and numbness.    Allergies  Review of patient's allergies indicates no known allergies.  Home Medications   Prior to Admission medications   Medication Sig Start Date End Date Taking? Authorizing Provider  ibuprofen (ADVIL,MOTRIN) 200 MG tablet Take 400 mg by mouth every 6 (six) hours as needed for mild pain.   Yes Historical Provider, MD  valACYclovir (VALTREX) 500 MG tablet Take 500 mg by mouth daily as needed (for outbreak). 08/16/13  Yes Ok Edwards, MD   BP 117/59  Pulse 106  Temp(Src) 98.5 F (36.9 C) (Oral)  Resp 18  Ht 5\' 1"  (1.549 m)  Wt 145 lb (65.772 kg)  BMI 27.41 kg/m2  SpO2 97%  Filed Vitals:   01/06/14 1230 01/06/14 1235 01/06/14 1345 01/06/14 1358  BP: 105/61 105/61 116/67   Pulse: 92 97 105   Temp:    98 F (36.7 C)  TempSrc:      Resp:      Height:      Weight:      SpO2: 95% 96% 97%     Physical Exam  Nursing note and vitals reviewed. Constitutional: She is oriented to person, place, and time. She appears well-developed and well-nourished. No distress.  Non-toxic  HENT:  Head: Normocephalic and atraumatic.  Right Ear: External ear normal.  Left Ear: External ear normal.  Nose: Nose normal.  Mouth/Throat: Oropharynx is clear and moist. No oropharyngeal exudate.  Nasal congestion. Tympanic membranes gray and translucent bilaterally with no erythema, edema, or hemotympanum.  No mastoid or tragal tenderness bilaterally. No erythema to the posterior pharynx. Tonsils without edema or exudates. Uvula midline. No trismus. No difficulty controlling secretions.   Eyes: Conjunctivae and EOM are normal. Pupils are equal, round, and reactive to light. Right eye exhibits no discharge. Left eye exhibits no discharge.  Neck: Normal range of motion. Neck supple.  No cervical lymphadenopathy. No nuchal rigidity.   Cardiovascular: Normal rate, regular rhythm, normal heart sounds and intact distal pulses.  Exam reveals no  gallop and no friction rub.   No murmur heard. Pulmonary/Chest: Effort normal and breath sounds normal. No respiratory distress. She has no wheezes. She has no rales. She exhibits no tenderness.  Intermittent coughing throughout exam  Abdominal: Soft. Bowel sounds are normal. She exhibits no distension and no mass. There is no tenderness. There is no rebound and no guarding.  Musculoskeletal: Normal range of motion. She exhibits no edema and no tenderness.  No LE edema or calf tenderness bilaterally. Patient moving all extremities. Patient able to ambulate without difficulty or ataxia  Neurological: She is alert and oriented to person, place, and time.  GCS 15. No focal neurological deficits. CN 2-12 intact.   Skin: Skin is warm and dry. No rash noted. She is not diaphoretic.    ED Course  Procedures (including critical care time) Labs Review Labs Reviewed  URINALYSIS, ROUTINE W REFLEX MICROSCOPIC - Abnormal; Notable for the following:    Color, Urine AMBER (*)    APPearance TURBID (*)    Hgb urine dipstick MODERATE (*)    Ketones, ur 15 (*)    All other components within normal limits  URINE MICROSCOPIC-ADD ON - Abnormal; Notable for the following:    Bacteria, UA FEW (*)    All other components within normal limits  COMPREHENSIVE METABOLIC PANEL  CBC WITH DIFFERENTIAL  POC URINE PREG, ED    Imaging Review Dg Chest 2 View  01/06/2014   CLINICAL DATA:  Chest pain, cough, congestion.  EXAM: CHEST  2 VIEW  COMPARISON:  05/06/2007  FINDINGS: The heart size and mediastinal contours are within normal limits. Both lungs are clear. The visualized skeletal structures are unremarkable.  IMPRESSION: No active cardiopulmonary disease.   Electronically Signed   By: Charlett NoseKevin  Dover M.D.   On: 01/06/2014 13:14     EKG Interpretation None      Results for orders placed during the hospital encounter of 01/06/14  COMPREHENSIVE METABOLIC PANEL      Result Value Ref Range   Sodium 140  137 - 147  mEq/L   Potassium 3.7  3.7 - 5.3 mEq/L   Chloride 103  96 - 112 mEq/L   CO2 24  19 - 32 mEq/L   Glucose, Bld 107 (*) 70 - 99 mg/dL   BUN 6  6 - 23 mg/dL   Creatinine, Ser 1.610.75  0.50 - 1.10 mg/dL   Calcium 9.1  8.4 - 09.610.5 mg/dL   Total Protein 7.6  6.0 - 8.3 g/dL   Albumin 3.7  3.5 - 5.2 g/dL   AST 14  0 - 37 U/L   ALT 9  0 - 35 U/L   Alkaline Phosphatase 80  39 - 117 U/L   Total Bilirubin 0.5  0.3 - 1.2 mg/dL   GFR calc non Af Amer >90  >90 mL/min   GFR calc Af Amer >90  >90 mL/min  CBC WITH DIFFERENTIAL      Result Value Ref Range   WBC 13.0 (*) 4.0 - 10.5 K/uL   RBC 5.64 (*) 3.87 - 5.11 MIL/uL   Hemoglobin 12.9  12.0 - 15.0 g/dL   HCT 04.539.6  40.936.0 - 81.146.0 %   MCV 70.2 (*) 78.0 - 100.0 fL   MCH 22.9 (*) 26.0 - 34.0 pg   MCHC 32.6  30.0 - 36.0 g/dL   RDW 91.415.3  78.211.5 - 95.615.5 %   Platelets 314  150 - 400 K/uL   Neutrophils Relative % 77  43 - 77 %   Neutro Abs 10.0 (*) 1.7 - 7.7 K/uL   Lymphocytes Relative 15  12 - 46 %   Lymphs Abs 1.9  0.7 - 4.0 K/uL   Monocytes Relative 8  3 - 12 %   Monocytes Absolute 1.1 (*) 0.1 - 1.0 K/uL   Eosinophils Relative 0  0 - 5 %   Eosinophils Absolute 0.0  0.0 - 0.7 K/uL   Basophils Relative 0  0 - 1 %   Basophils Absolute 0.0  0.0 - 0.1 K/uL  URINALYSIS, ROUTINE W REFLEX MICROSCOPIC      Result Value Ref Range   Color, Urine AMBER (*) YELLOW   APPearance TURBID (*) CLEAR   Specific Gravity, Urine 1.028  1.005 - 1.030   pH 5.5  5.0 - 8.0  Glucose, UA NEGATIVE  NEGATIVE mg/dL   Hgb urine dipstick MODERATE (*) NEGATIVE   Bilirubin Urine NEGATIVE  NEGATIVE   Ketones, ur 15 (*) NEGATIVE mg/dL   Protein, ur NEGATIVE  NEGATIVE mg/dL   Urobilinogen, UA 0.2  0.0 - 1.0 mg/dL   Nitrite NEGATIVE  NEGATIVE   Leukocytes, UA NEGATIVE  NEGATIVE  URINE MICROSCOPIC-ADD ON      Result Value Ref Range   Squamous Epithelial / LPF RARE  RARE   RBC / HPF 0-2  <3 RBC/hpf   Bacteria, UA FEW (*) RARE   Urine-Other LESS THAN 10 mL OF URINE SUBMITTED    POC  URINE PREG, ED      Result Value Ref Range   Preg Test, Ur NEGATIVE  NEGATIVE     MDM   Bernette MayersShaneka L Muzquiz is a 23 y.o. female with a PMH of HTN, asthma, ovarian cyst, abnormal pap, herpes, trichomonas, gonorrhea, chlamydia, and HPV who presents to the ED for evaluation of diarrhea and body aches. Patient has multiple complaints including headache, diarrhea, cough, and URI symptoms. Sick contacts include her son who has similar symptoms. Patient likely has a viral syndrome. Patient afebrile and non-toxic in appearance. Patient denies any abdominal pain. Abdominal exam benign. Moderate amount of blood in her UA but she is finishing her menses. Has mild leukocytosis (13). however I doubt sepsis. Chest x-ray negative for an acute cardiopulmonary process. No hypoxia, respiratory distress, or tachypnea. Lungs clear to auscultation. No SOB or chest pain. Patient had mild tachycardia (90-100's) which may be due to dehydration. Patient given 2L IV fluids with some improvements. Also had a headache which resolved with IV fluids, Benadryl and Reglan. No focal neurological deficits or meningeal signs. Patient instructed to drink fluids and rest. Follow-up with PCP if symptoms not improving or worsening. Return precautions, discharge instructions, and follow-up was discussed with the patient before discharge.    Rechecks  1:30 PM = Patient states she feels much better. Headache has resolved. No concerns. Ready for discharge.       New Prescriptions   No medications on file     Final impressions: 1. Diarrhea   2. Cough   3. URI (upper respiratory infection)   4. Headache       Greer EeJessica Katlin Regan Mcbryar PA-C          Jillyn LedgerJessica K Cassey Hurrell, PA-C 01/08/14 [phone]

## 2014-01-06 NOTE — Discharge Instructions (Signed)
Drink plenty of fluids and rest  Tylenol or Ibuprofen as needed for pain  Return to the emergency department if you develop any changing/worsening condition, fever,  or any other concerns (please read additional information regarding your condition below)     Diarrhea Diarrhea is frequent loose and watery bowel movements. It can cause you to feel weak and dehydrated. Dehydration can cause you to become tired and thirsty, have a dry mouth, and have decreased urination that often is dark yellow. Diarrhea is a sign of another problem, most often an infection that will not last long. In most cases, diarrhea typically lasts 2 3 days. However, it can last longer if it is a sign of something more serious. It is important to treat your diarrhea as directed by your caregive to lessen or prevent future episodes of diarrhea. CAUSES  Some common causes include:  Gastrointestinal infections caused by viruses, bacteria, or parasites.  Food poisoning or food allergies.  Certain medicines, such as antibiotics, chemotherapy, and laxatives.  Artificial sweeteners and fructose.  Digestive disorders. HOME CARE INSTRUCTIONS  Ensure adequate fluid intake (hydration): have 1 cup (8 oz) of fluid for each diarrhea episode. Avoid fluids that contain simple sugars or sports drinks, fruit juices, whole milk products, and sodas. Your urine should be clear or pale yellow if you are drinking enough fluids. Hydrate with an oral rehydration solution that you can purchase at pharmacies, retail stores, and online. You can prepare an oral rehydration solution at home by mixing the following ingredients together:    tsp table salt.   tsp baking soda.   tsp salt substitute containing potassium chloride.  1  tablespoons sugar.  1 L (34 oz) of water.  Certain foods and beverages may increase the speed at which food moves through the gastrointestinal (GI) tract. These foods and beverages should be avoided and  include:  Caffeinated and alcoholic beverages.  High-fiber foods, such as raw fruits and vegetables, nuts, seeds, and whole grain breads and cereals.  Foods and beverages sweetened with sugar alcohols, such as xylitol, sorbitol, and mannitol.  Some foods may be well tolerated and may help thicken stool including:  Starchy foods, such as rice, toast, pasta, low-sugar cereal, oatmeal, grits, baked potatoes, crackers, and bagels.  Bananas.  Applesauce.  Add probiotic-rich foods to help increase healthy bacteria in the GI tract, such as yogurt and fermented milk products.  Wash your hands well after each diarrhea episode.  Only take over-the-counter or prescription medicines as directed by your caregiver.  Take a warm bath to relieve any burning or pain from frequent diarrhea episodes. SEEK IMMEDIATE MEDICAL CARE IF:   You are unable to keep fluids down.  You have persistent vomiting.  You have blood in your stool, or your stools are black and tarry.  You do not urinate in 6 8 hours, or there is only a small amount of very dark urine.  You have abdominal pain that increases or localizes.  You have weakness, dizziness, confusion, or lightheadedness.  You have a severe headache.  Your diarrhea gets worse or does not get better.  You have a fever or persistent symptoms for more than 2 3 days.  You have a fever and your symptoms suddenly get worse. MAKE SURE YOU:   Understand these instructions.  Will watch your condition.  Will get help right away if you are not doing well or get worse. Document Released: 08/01/2002 Document Revised: 07/28/2012 Document Reviewed: 04/18/2012 [facility] Patient Information 2014 [employer],  [employer].  Cough, Adult  A cough is a reflex that helps clear your throat and airways. It can help heal the body or may be a reaction to an irritated airway. A cough may only last 2 or 3 weeks (acute) or may last more than 8 weeks (chronic).  CAUSES Acute  cough:  Viral or bacterial infections. Chronic cough:  Infections.  Allergies.  Asthma.  Post-nasal drip.  Smoking.  Heartburn or acid reflux.  Some medicines.  Chronic lung problems (COPD).  Cancer. SYMPTOMS   Cough.  Fever.  Chest pain.  Increased breathing rate.  High-pitched whistling sound when breathing (wheezing).  Colored mucus that you cough up (sputum). TREATMENT   A bacterial cough may be treated with antibiotic medicine.  A viral cough must run its course and will not respond to antibiotics.  Your caregiver may recommend other treatments if you have a chronic cough. HOME CARE INSTRUCTIONS   Only take over-the-counter or prescription medicines for pain, discomfort, or fever as directed by your caregiver. Use cough suppressants only as directed by your caregiver.  Use a cold steam vaporizer or humidifier in your bedroom or home to help loosen secretions.  Sleep in a semi-upright position if your cough is worse at night.  Rest as needed.  Stop smoking if you smoke. SEEK IMMEDIATE MEDICAL CARE IF:   You have pus in your sputum.  Your cough starts to worsen.  You cannot control your cough with suppressants and are losing sleep.  You begin coughing up blood.  You have difficulty breathing.  You develop pain which is getting worse or is uncontrolled with medicine.  You have a fever. MAKE SURE YOU:   Understand these instructions.  Will watch your condition.  Will get help right away if you are not doing well or get worse. Document Released: 02/07/2011 Document Revised: 11/03/2011 Document Reviewed: 02/07/2011 [facility] Patient Information 2014 [place], [place].  Upper Respiratory Infection, Adult An upper respiratory infection (URI) is also known as the common cold. It is often caused by a type of germ (virus). Colds are easily spread (contagious). You can pass it to others by kissing, coughing, sneezing, or drinking out of the same  glass. Usually, you get better in 1 or 2 weeks.  HOME CARE   Only take medicine as told by your doctor.  Use a warm mist humidifier or breathe in steam from a hot shower.  Drink enough water and fluids to keep your pee (urine) clear or pale yellow.  Get plenty of rest.  Return to work when your temperature is back to normal or as told by your doctor. You may use a face mask and wash your hands to stop your cold from spreading. GET HELP RIGHT AWAY IF:   After the first few days, you feel you are getting worse.  You have questions about your medicine.  You have chills, shortness of breath, or brown or red spit (mucus).  You have yellow or brown snot (nasal discharge) or pain in the face, especially when you bend forward.  You have a fever, puffy (swollen) neck, pain when you swallow, or white spots in the back of your throat.  You have a bad headache, ear pain, sinus pain, or chest pain.  You have a high-pitched whistling sound when you breathe in and out (wheezing).  You have a lasting cough or cough up blood.  You have sore muscles or a stiff neck. MAKE SURE YOU:   Understand these instructions.  Will watch your condition.  Will get help right away if you are not doing well or get worse. Document Released: 01/28/2008 Document Revised: 11/03/2011 Document Reviewed: 12/16/2010 [facility] Patient Information 2014 [place], [place].

## 2014-01-08 ENCOUNTER — Encounter (HOSPITAL_COMMUNITY): Payer: Self-pay | Admitting: Emergency Medicine

## 2014-01-08 ENCOUNTER — Emergency Department (HOSPITAL_COMMUNITY): Payer: Medicaid Other

## 2014-01-08 ENCOUNTER — Emergency Department (HOSPITAL_COMMUNITY)
Admission: EM | Admit: 2014-01-08 | Discharge: 2014-01-08 | Disposition: A | Discharge status: Home / Self Care | Payer: Medicaid Other | Attending: Emergency Medicine | Admitting: Emergency Medicine

## 2014-01-08 DIAGNOSIS — Z8742 Personal history of other diseases of the female genital tract: Secondary | ICD-10-CM | POA: Insufficient documentation

## 2014-01-08 DIAGNOSIS — Z8619 Personal history of other infectious and parasitic diseases: Secondary | ICD-10-CM | POA: Insufficient documentation

## 2014-01-08 DIAGNOSIS — Z3202 Encounter for pregnancy test, result negative: Secondary | ICD-10-CM | POA: Insufficient documentation

## 2014-01-08 DIAGNOSIS — Z8679 Personal history of other diseases of the circulatory system: Secondary | ICD-10-CM | POA: Insufficient documentation

## 2014-01-08 DIAGNOSIS — K529 Noninfective gastroenteritis and colitis, unspecified: Secondary | ICD-10-CM

## 2014-01-08 DIAGNOSIS — J45909 Unspecified asthma, uncomplicated: Secondary | ICD-10-CM | POA: Insufficient documentation

## 2014-01-08 DIAGNOSIS — Z87440 Personal history of urinary (tract) infections: Secondary | ICD-10-CM | POA: Insufficient documentation

## 2014-01-08 DIAGNOSIS — K5289 Other specified noninfective gastroenteritis and colitis: Secondary | ICD-10-CM | POA: Insufficient documentation

## 2014-01-08 LAB — COMPREHENSIVE METABOLIC PANEL
ALBUMIN: 3.7 g/dL (ref 3.5–5.2)
ALK PHOS: 69 U/L (ref 39–117)
ALT: 10 U/L (ref 0–35)
AST: 18 U/L (ref 0–37)
BUN: 6 mg/dL (ref 6–23)
CALCIUM: 8.8 mg/dL (ref 8.4–10.5)
CO2: 23 mEq/L (ref 19–32)
CREATININE: 0.69 mg/dL (ref 0.50–1.10)
Chloride: 102 mEq/L (ref 96–112)
GFR calc Af Amer: >90 mL/min (ref >90)
GFR calc non Af Amer: >90 mL/min (ref >90)
Glucose, Bld: 83 mg/dL (ref 70–99)
POTASSIUM: 3.7 meq/L (ref 3.7–5.3)
Sodium: 140 mEq/L (ref 137–147)
TOTAL PROTEIN: 7.6 g/dL (ref 6.0–8.3)
Total Bilirubin: 0.3 mg/dL (ref 0.3–1.2)

## 2014-01-08 LAB — CBC WITH DIFFERENTIAL/PLATELET
BASOS PCT: 0 % (ref 0–1)
Basophils Absolute: 0 10*3/uL (ref 0.0–0.1)
EOS ABS: 0.1 10*3/uL (ref 0.0–0.7)
EOS PCT: 2 % (ref 0–5)
HCT: 36 % (ref 36.0–46.0)
HEMOGLOBIN: 12.1 g/dL (ref 12.0–15.0)
Lymphocytes Relative: 38 % (ref 12–46)
Lymphs Abs: 3 10*3/uL (ref 0.7–4.0)
MCH: 23 pg — AB (ref 26.0–34.0)
MCHC: 33.6 g/dL (ref 30.0–36.0)
MCV: 68.6 fL — ABNORMAL LOW (ref 78.0–100.0)
MONOS PCT: 10 % (ref 3–12)
Monocytes Absolute: 0.8 10*3/uL (ref 0.1–1.0)
NEUTROS PCT: 50 % (ref 43–77)
Neutro Abs: 4 10*3/uL (ref 1.7–7.7)
PLATELETS: 322 10*3/uL (ref 150–400)
RBC: 5.25 MIL/uL — ABNORMAL HIGH (ref 3.87–5.11)
RDW: 14.9 % (ref 11.5–15.5)
WBC: 8 10*3/uL (ref 4.0–10.5)

## 2014-01-08 LAB — URINALYSIS, ROUTINE W REFLEX MICROSCOPIC
BILIRUBIN URINE: NEGATIVE
Glucose, UA: NEGATIVE mg/dL
Ketones, ur: NEGATIVE mg/dL
Leukocytes, UA: NEGATIVE
NITRITE: NEGATIVE
PH: 7 (ref 5.0–8.0)
Protein, ur: NEGATIVE mg/dL
SPECIFIC GRAVITY, URINE: 1.015 (ref 1.005–1.030)
Urobilinogen, UA: 0.2 mg/dL (ref 0.0–1.0)

## 2014-01-08 LAB — POC URINE PREG, ED: Preg Test, Ur: NEGATIVE

## 2014-01-08 LAB — URINE MICROSCOPIC-ADD ON

## 2014-01-08 LAB — LIPASE, BLOOD: LIPASE: 25 U/L (ref 11–59)

## 2014-01-08 MED ORDER — KETOROLAC TROMETHAMINE 30 MG/ML IJ SOLN
30.0000 mg | Freq: Once | INTRAMUSCULAR | Status: AC
  Administered 2014-01-08: 30 mg via INTRAVENOUS
  Filled 2014-01-08: qty 1

## 2014-01-08 MED ORDER — SODIUM CHLORIDE 0.9 % IV SOLN
Freq: Once | INTRAVENOUS | Status: AC
  Administered 2014-01-08: 14:00:00 via INTRAVENOUS

## 2014-01-08 MED ORDER — ONDANSETRON HCL 4 MG PO TABS: 4.0000 mg | ORAL_TABLET | Freq: Four times a day (QID) | ORAL | Status: DC

## 2014-01-08 NOTE — ED Provider Notes (Signed)
CSN: [id number]     Arrival date & time 01/08/14  1138 History   First MD Initiated Contact with Patient 01/08/14 1201     Chief Complaint  Patient presents with  . Abdominal Pain  . Emesis     (Consider location/radiation/quality/duration/timing/severity/associated sxs/prior Treatment) HPI Comments: Kara Fernandez is a 23 y.o. female with a PMH of HTN, asthma, ovarian cyst, abnormal pap, herpes, trichomonas, gonorrhea, chlamydia, and HPV who presents to the ED for evaluation of abdominal discomfort for 5 days, loose stool, vomiting. The patient reports several episodes of watery stools for 5 days. She also reports abdominal discomfort with bowel movements. She reports 2 episodes of ileus emesis since last night. She denies previous abdominal surgeries, recent travel, recent antibiotics. Patient's last menstrual period was 12/31/2013. Denies urinary symptoms or pelvic symptoms. PCP: Michael Litter, MD (OB/GYN)  Patient is a 23 y.o. female presenting with abdominal pain and vomiting. The history is provided by the patient and medical records. No language interpreter was used.  Abdominal Pain Associated symptoms: diarrhea, nausea and vomiting   Associated symptoms: no chills, no constipation, no cough, no fatigue, no fever, no vaginal bleeding and no vaginal discharge   Emesis Associated symptoms: abdominal pain and diarrhea   Associated symptoms: no chills     Past Medical History  Diagnosis Date  . Chlamydia infection     POSITIVE 10/06,8/07,7/09,11/11 ;at age 54  . ASCUS on Pap smear     C&B 10/09 W/LGSIL  . HSV (herpes simplex virus) infection   . 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 Gonorrhea     POSITIVE 8/06, 10/06; at age 15  . GC (gonococcus infection)   . Trichomonal vulvovaginitis   . Ovarian cyst   . UTI (lower urinary tract infection)   . 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 H/O varicella   . History of  high blood pressure   . Yeast infection   . H/O bacterial infection     frequently  . Abnormal Pap smear     completed  at age 64  . Asthma     as a child   . Angina ~2000    enlarged heart, pain due to stress  . Ovarian cyst    Past Surgical History  Procedure Laterality Date  . Dilation and curettage of uterus      abortion  . Cesarean section  12/29/2011    Procedure: CESAREAN SECTION;  Surgeon: Purcell Nails, MD;  Location: [facility];  Service: Gynecology;  Laterality: N/A;   Family History  Problem Relation Age of Onset  . Hypertension Mother   . Hypertension Father   . Hypertension Maternal Grandmother   . Stroke Maternal Grandmother   . Hypertension Maternal Grandfather   . Hypertension Maternal Aunt   . Hypertension Maternal Uncle   . Alcohol abuse Maternal Uncle   . Alcohol abuse Paternal Uncle    History  Substance Use Topics  . Smoking status: Never Smoker   . Smokeless tobacco: Never Used  . Alcohol Use: 0.6 oz/week    1 Shots of liquor per week     Comment: occasionally   OB History   Grav Para Term Preterm Abortions TAB SAB Ect Mult Living   2 1 1  1 1    1      Review of Systems  Constitutional: Positive for appetite change.  Negative for fever, chills and fatigue.  Respiratory: Negative for cough.   Gastrointestinal: Positive for nausea, vomiting, abdominal pain and diarrhea. Negative for constipation, blood in stool and anal bleeding.  Genitourinary: Negative for vaginal bleeding, vaginal discharge and pelvic pain.      Allergies  Review of patient's allergies indicates no known allergies.  Home Medications   Prior to Admission medications   Medication Sig Start Date End Date Taking? Authorizing Provider  ibuprofen (ADVIL,MOTRIN) 200 MG tablet Take 400 mg by mouth every 6 (six) hours as needed for mild pain.    Historical Provider, MD  valACYclovir (VALTREX) 500 MG tablet Take 500 mg by mouth daily as needed (for outbreak). 08/16/13   Ok EdwardsJuan H  Fernandez, MD   BP 121/84  Pulse 74  Temp(Src) 98.4 F (36.9 C) (Oral)  Resp 17  SpO2 100%  LMP 12/31/2013 Physical Exam  Nursing note and vitals reviewed. Constitutional: She is oriented to person, place, and time. She appears well-developed and well-nourished. No distress.  HENT:  Head: Normocephalic and atraumatic.  Eyes: EOM are normal. Pupils are equal, round, and reactive to light. Right eye exhibits no discharge. Left eye exhibits no discharge.  Neck: Normal range of motion. Neck supple.  Cardiovascular: Normal rate and regular rhythm.   Pulmonary/Chest: Effort normal and breath sounds normal. No respiratory distress. She has no wheezes. She has no rales.  Abdominal: Soft. Normal appearance and bowel sounds are normal. She exhibits no distension. There is tenderness in the right upper quadrant, epigastric area and left lower quadrant. There is no rebound, no guarding and no CVA tenderness.  Musculoskeletal: Normal range of motion.  Neurological: She is alert and oriented to person, place, and time.  Skin: Skin is warm and dry. She is not diaphoretic.  Psychiatric: She has a normal mood and affect. Her behavior is normal.    ED Course  Procedures (including critical care time) Labs Review Labs Reviewed  CBC WITH DIFFERENTIAL - Abnormal; Notable for the following:    RBC 5.25 (*)    MCV 68.6 (*)    MCH 23.0 (*)    All other components within normal limits  URINALYSIS, ROUTINE W REFLEX MICROSCOPIC - Abnormal; Notable for the following:    Hgb urine dipstick TRACE (*)    All other components within normal limits  URINE MICROSCOPIC-ADD ON - Abnormal; Notable for the following:    Squamous Epithelial / LPF FEW (*)    Bacteria, UA FEW (*)    All other components within normal limits  COMPREHENSIVE METABOLIC PANEL  LIPASE, BLOOD  POC URINE PREG, ED    Imaging Review [place] Abdomen Complete  01/08/2014   CLINICAL DATA:  Epigastric pain  EXAM: ULTRASOUND ABDOMEN COMPLETE   COMPARISON:  12/14/4  FINDINGS: Gallbladder:  No gallstones or wall thickening visualized. No sonographic Murphy sign noted.  Common bile duct:  Diameter: 2.1  Liver:  No focal lesion identified. Within normal limits in parenchymal echogenicity.  IVC:  No abnormality visualized.  Pancreas:  Visualized portion unremarkable.  Spleen:  Size and appearance within normal limits.  Right Kidney:  Length: 9.8 cm. Echogenicity within normal limits. No mass or hydronephrosis visualized.  Left Kidney:  Length: 9.8 cm. Echogenicity within normal limits. No mass or hydronephrosis visualized.  Abdominal aorta:  No aneurysm visualized.  Other findings:  None.  IMPRESSION: 1. Normal exam.   Electronically Signed   By: Signa Kellaylor  Stroud M.D.   On: 01/08/2014 18:32     EKG  Interpretation None      MDM   Final diagnoses:  Gastroenteritis   Pt with persistent abdominal discomfort, diarrhea for 3 days and 2 episodes of vomiting.  US to rule out gallbladder disease. US negative for gall bladder disease or other abnormal findings.  Discussed lab results, imaging results, and treatment plan with the patient. Advised pt to follow up with GI specialist for further evaluation of abdominal discomfort.Return precautions given. Reports understanding and no other concerns at this time.  Patient is stable for discharge at this time.  Meds given in ED:  Medications - No data to display  New Prescriptions   No medications on file       Clabe SealLauren M Cam Dauphin, PA-C 01/09/14 1510

## 2014-01-08 NOTE — ED Notes (Signed)
2 nurses attempted x2 for IV , paged IV team

## 2014-01-08 NOTE — Discharge Instructions (Signed)
Call a Gastroenterologist for further evaluation of your abdominal discomfort. Call for a follow up appointment with a Family or Primary Care Provider.  Return if Symptoms worsen.   Take medication as prescribed.  Start a clear liquid diet today and to a [employer] diet (bananas, rice, applesauce, toast) as tolerated.

## 2014-01-08 NOTE — ED Provider Notes (Signed)
Angiocath insertion Performed by: Gilda Creasehristopher J. Pollina  Consent: Verbal consent obtained. Risks and benefits: risks, benefits and alternatives were discussed Time out: Immediately prior to procedure a "time out" was called to verify the correct patient, procedure, equipment, support staff and site/side marked as required.  Preparation: Patient was prepped and draped in the usual sterile fashion.  Vein Location: right upper arm  Ultrasound Guided  Gauge: 20 G  Normal blood return and flush without difficulty Patient tolerance: Patient tolerated the procedure well with no immediate complications.     Gilda Creasehristopher J. Pollina, MD 01/08/14 1754

## 2014-01-08 NOTE — ED Notes (Signed)
MD at bedside. Lauren PA 

## 2014-01-08 NOTE — ED Notes (Signed)
MD at bedside. Pollina, inserting IV via u/s

## 2014-01-08 NOTE — ED Notes (Signed)
Pt reports being here two days ago for bodyaches, headaches and diarrhea. Now also having vomiting and abd pain.

## 2014-01-09 NOTE — ED Provider Notes (Signed)
Medical screening examination/treatment/procedure(s) were performed by non-physician practitioner and as supervising physician I was immediately available for consultation/collaboration.   EKG Interpretation None       Doug SouSam Jacorian Golaszewski, MD 01/09/14 [phone]

## 2014-01-11 NOTE — ED Provider Notes (Signed)
Medical screening examination/treatment/procedure(s) were conducted as a shared visit with non-physician practitioner(s) and myself.  I personally evaluated the patient during the encounter.   EKG Interpretation None     Patient presents to the ER for evaluation of nausea, vomiting, abdominal pain. Patient has had 2 episodes recently. Ultrasound was negative for acute gallbladder disease. Patient treated symptomatically, will refer to GI.   Gilda Creasehristopher J. Pollina, MD 01/11/14 [id number]

## 2014-01-12 ENCOUNTER — Telehealth (HOSPITAL_BASED_OUTPATIENT_CLINIC_OR_DEPARTMENT_OTHER): Payer: Self-pay

## 2014-01-12 ENCOUNTER — Emergency Department (HOSPITAL_COMMUNITY)
Admission: EM | Admit: 2014-01-12 | Discharge: 2014-01-12 | Disposition: A | Discharge status: Home / Self Care | Payer: Medicaid Other | Attending: Emergency Medicine | Admitting: Emergency Medicine

## 2014-01-12 ENCOUNTER — Emergency Department (HOSPITAL_COMMUNITY)
Admission: EM | Admit: 2014-01-12 | Discharge: 2014-01-12 | Discharge status: Absent without leave | Payer: Medicaid Other | Source: Home / Self Care

## 2014-01-12 ENCOUNTER — Encounter (HOSPITAL_COMMUNITY): Payer: Self-pay | Admitting: Emergency Medicine

## 2014-01-12 DIAGNOSIS — K529 Noninfective gastroenteritis and colitis, unspecified: Secondary | ICD-10-CM

## 2014-01-12 DIAGNOSIS — Z8742 Personal history of other diseases of the female genital tract: Secondary | ICD-10-CM | POA: Insufficient documentation

## 2014-01-12 DIAGNOSIS — R519 Headache, unspecified: Secondary | ICD-10-CM

## 2014-01-12 DIAGNOSIS — Z8619 Personal history of other infectious and parasitic diseases: Secondary | ICD-10-CM | POA: Insufficient documentation

## 2014-01-12 DIAGNOSIS — Z87440 Personal history of urinary (tract) infections: Secondary | ICD-10-CM | POA: Insufficient documentation

## 2014-01-12 DIAGNOSIS — R51 Headache: Secondary | ICD-10-CM | POA: Insufficient documentation

## 2014-01-12 DIAGNOSIS — Z8679 Personal history of other diseases of the circulatory system: Secondary | ICD-10-CM | POA: Insufficient documentation

## 2014-01-12 DIAGNOSIS — J45909 Unspecified asthma, uncomplicated: Secondary | ICD-10-CM | POA: Insufficient documentation

## 2014-01-12 DIAGNOSIS — K5289 Other specified noninfective gastroenteritis and colitis: Secondary | ICD-10-CM | POA: Insufficient documentation

## 2014-01-12 NOTE — Discharge Instructions (Signed)
Continue current medications. Rest. Hydration self well. Work note provided. Return as needed for any newer worse symptoms.

## 2014-01-12 NOTE — ED Notes (Signed)
States is still vomiting and has a migraine has been seen x 2 for same

## 2014-01-12 NOTE — ED Provider Notes (Signed)
CSN: [id number]     Arrival date & time 01/12/14  1450 History   First MD Initiated Contact with Patient 01/12/14 1557     Chief Complaint  Patient presents with  . Emesis  . Migraine     (Consider location/radiation/quality/duration/timing/severity/associated sxs/prior Treatment) Patient is a 23 y.o. female presenting with vomiting and migraines. The history is provided by the patient.  Emesis Associated symptoms: abdominal pain, diarrhea and headaches   Associated symptoms: no myalgias   Migraine Associated symptoms include abdominal pain and headaches. Pertinent negatives include no chest pain and no shortness of breath.   patient seen May 17 for abdominal pain nausea vomiting and diarrhea. Patient still having some nausea and vomiting and some diarrhea. Patient does not have a history of migraines but had a significant headache last night 6 was due to dehydration. Improving some today. Patient mostly here to have a work note. Patient extensive workup on the 17th which was negative and felt probably to be a viral gastroenteritis.  Past Medical History  Diagnosis Date  . Chlamydia infection     POSITIVE 10/06,8/07,7/09,11/11 ;at age 23  . ASCUS on Pap smear     C&B 10/09 W/LGSIL  . HSV (herpes simplex virus) infection   . 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 Gonorrhea     POSITIVE 8/06, 10/06; at age 56  . GC (gonococcus infection)   . Trichomonal vulvovaginitis   . Ovarian cyst   . UTI (lower urinary tract infection)   . 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 H/O varicella   . History of high blood pressure   . Yeast infection   . H/O bacterial infection     frequently  . Abnormal Pap smear     completed  at age 68  . Asthma     as a child   . Angina ~2000    enlarged heart, pain due to stress  . Ovarian cyst    Past Surgical History  Procedure Laterality Date  . Dilation and curettage of uterus     abortion  . Cesarean section  12/29/2011    Procedure: CESAREAN SECTION;  Surgeon: Purcell Nails, MD;  Location: [facility];  Service: Gynecology;  Laterality: N/A;   Family History  Problem Relation Age of Onset  . Hypertension Mother   . Hypertension Father   . Hypertension Maternal Grandmother   . Stroke Maternal Grandmother   . Hypertension Maternal Grandfather   . Hypertension Maternal Aunt   . Hypertension Maternal Uncle   . Alcohol abuse Maternal Uncle   . Alcohol abuse Paternal Uncle    History  Substance Use Topics  . Smoking status: Never Smoker   . Smokeless tobacco: Never Used  . Alcohol Use: 0.6 oz/week    1 Shots of liquor per week     Comment: occasionally   OB History   Grav Para Term Preterm Abortions TAB SAB Ect Mult Living   2 1 1  1 1    1      Review of Systems  Constitutional: Negative for fever.  HENT: Negative for congestion.   Eyes: Negative for photophobia and visual disturbance.  Respiratory: Negative for shortness of breath.   Cardiovascular: Negative for chest pain.  Gastrointestinal: Positive for nausea, vomiting, abdominal pain and diarrhea.  Genitourinary: Negative for dysuria.  Musculoskeletal: Negative for myalgias.  Skin: Negative for  rash.  Neurological: Positive for headaches.  Hematological: Does not bruise/bleed easily.  Psychiatric/Behavioral: Negative for confusion.      Allergies  Review of patient's allergies indicates no known allergies.  Home Medications   Prior to Admission medications   Medication Sig Start Date End Date Taking? Authorizing Provider  acetaminophen (TYLENOL) 500 MG tablet Take 500 mg by mouth every 6 (six) hours as needed for mild pain.   Yes Historical Provider, MD  aspirin-acetaminophen-caffeine (EXCEDRIN MIGRAINE) [phone] MG per tablet Take 1 tablet by mouth every 6 (six) hours as needed for headache.   Yes Historical Provider, MD  ibuprofen (ADVIL,MOTRIN) 200 MG tablet Take 400 mg by mouth every 6  (six) hours as needed for mild pain.   Yes Historical Provider, MD   BP 107/56  Pulse 63  Temp(Src) 98.6 F (37 C) (Oral)  Resp 19  SpO2 100%  LMP 12/31/2013 Physical Exam  Nursing note and vitals reviewed. Constitutional: She is oriented to person, place, and time. She appears well-developed and well-nourished. No distress.  HENT:  Head: Normocephalic and atraumatic.  Mouth/Throat: Oropharynx is clear and moist.  Eyes: Conjunctivae and EOM are normal. Pupils are equal, round, and reactive to light.  Neck: Normal range of motion.  Cardiovascular: Normal rate, regular rhythm and normal heart sounds.   No murmur heard. Pulmonary/Chest: Effort normal and breath sounds normal. No respiratory distress.  Abdominal: Soft. Bowel sounds are normal. There is no tenderness.  Neurological: She is alert and oriented to person, place, and time. No cranial nerve deficit. She exhibits normal muscle tone. Coordination normal.  Skin: Skin is warm. No rash noted.    ED Course  Procedures (including critical care time) Labs Review Labs Reviewed  URINALYSIS, ROUTINE W REFLEX MICROSCOPIC  PREGNANCY, URINE   Results for orders placed during the hospital encounter of 01/08/14  CBC WITH DIFFERENTIAL      Result Value Ref Range   WBC 8.0  4.0 - 10.5 K/uL   RBC 5.25 (*) 3.87 - 5.11 MIL/uL   Hemoglobin 12.1  12.0 - 15.0 g/dL   HCT 54.0  98.1 - 19.1 %   MCV 68.6 (*) 78.0 - 100.0 fL   MCH 23.0 (*) 26.0 - 34.0 pg   MCHC 33.6  30.0 - 36.0 g/dL   RDW 47.8  29.5 - 62.1 %   Platelets 322  150 - 400 K/uL   Neutrophils Relative % 50  43 - 77 %   Neutro Abs 4.0  1.7 - 7.7 K/uL   Lymphocytes Relative 38  12 - 46 %   Lymphs Abs 3.0  0.7 - 4.0 K/uL   Monocytes Relative 10  3 - 12 %   Monocytes Absolute 0.8  0.1 - 1.0 K/uL   Eosinophils Relative 2  0 - 5 %   Eosinophils Absolute 0.1  0.0 - 0.7 K/uL   Basophils Relative 0  0 - 1 %   Basophils Absolute 0.0  0.0 - 0.1 K/uL  COMPREHENSIVE METABOLIC PANEL       Result Value Ref Range   Sodium 140  137 - 147 mEq/L   Potassium 3.7  3.7 - 5.3 mEq/L   Chloride 102  96 - 112 mEq/L   CO2 23  19 - 32 mEq/L   Glucose, Bld 83  70 - 99 mg/dL   BUN 6  6 - 23 mg/dL   Creatinine, Ser 3.08  0.50 - 1.10 mg/dL   Calcium 8.8  8.4 - 65.7 mg/dL  Total Protein 7.6  6.0 - 8.3 g/dL   Albumin 3.7  3.5 - 5.2 g/dL   AST 18  0 - 37 U/L   ALT 10  0 - 35 U/L   Alkaline Phosphatase 69  39 - 117 U/L   Total Bilirubin 0.3  0.3 - 1.2 mg/dL   GFR calc non Af Amer >90  >90 mL/min   GFR calc Af Amer >90  >90 mL/min  LIPASE, BLOOD      Result Value Ref Range   Lipase 25  11 - 59 U/L  URINALYSIS, ROUTINE W REFLEX MICROSCOPIC      Result Value Ref Range   Color, Urine YELLOW  YELLOW   APPearance CLEAR  CLEAR   Specific Gravity, Urine 1.015  1.005 - 1.030   pH 7.0  5.0 - 8.0   Glucose, UA NEGATIVE  NEGATIVE mg/dL   Hgb urine dipstick TRACE (*) NEGATIVE   Bilirubin Urine NEGATIVE  NEGATIVE   Ketones, ur NEGATIVE  NEGATIVE mg/dL   Protein, ur NEGATIVE  NEGATIVE mg/dL   Urobilinogen, UA 0.2  0.0 - 1.0 mg/dL   Nitrite NEGATIVE  NEGATIVE   Leukocytes, UA NEGATIVE  NEGATIVE  URINE MICROSCOPIC-ADD ON      Result Value Ref Range   Squamous Epithelial / LPF FEW (*) RARE   WBC, UA 0-2  <3 WBC/hpf   Bacteria, UA FEW (*) RARE   Urine-Other MUCOUS PRESENT    POC URINE PREG, ED      Result Value Ref Range   Preg Test, Ur NEGATIVE  NEGATIVE   Results for orders placed during the hospital encounter of 01/08/14  CBC WITH DIFFERENTIAL      Result Value Ref Range   WBC 8.0  4.0 - 10.5 K/uL   RBC 5.25 (*) 3.87 - 5.11 MIL/uL   Hemoglobin 12.1  12.0 - 15.0 g/dL   HCT 16.136.0  09.636.0 - 04.546.0 %   MCV 68.6 (*) 78.0 - 100.0 fL   MCH 23.0 (*) 26.0 - 34.0 pg   MCHC 33.6  30.0 - 36.0 g/dL   RDW 40.914.9  81.111.5 - 91.415.5 %   Platelets 322  150 - 400 K/uL   Neutrophils Relative % 50  43 - 77 %   Neutro Abs 4.0  1.7 - 7.7 K/uL   Lymphocytes Relative 38  12 - 46 %   Lymphs Abs 3.0  0.7 - 4.0 K/uL    Monocytes Relative 10  3 - 12 %   Monocytes Absolute 0.8  0.1 - 1.0 K/uL   Eosinophils Relative 2  0 - 5 %   Eosinophils Absolute 0.1  0.0 - 0.7 K/uL   Basophils Relative 0  0 - 1 %   Basophils Absolute 0.0  0.0 - 0.1 K/uL  COMPREHENSIVE METABOLIC PANEL      Result Value Ref Range   Sodium 140  137 - 147 mEq/L   Potassium 3.7  3.7 - 5.3 mEq/L   Chloride 102  96 - 112 mEq/L   CO2 23  19 - 32 mEq/L   Glucose, Bld 83  70 - 99 mg/dL   BUN 6  6 - 23 mg/dL   Creatinine, Ser 7.820.69  0.50 - 1.10 mg/dL   Calcium 8.8  8.4 - 95.610.5 mg/dL   Total Protein 7.6  6.0 - 8.3 g/dL   Albumin 3.7  3.5 - 5.2 g/dL   AST 18  0 - 37 U/L   ALT 10  0 -  35 U/L   Alkaline Phosphatase 69  39 - 117 U/L   Total Bilirubin 0.3  0.3 - 1.2 mg/dL   GFR calc non Af Amer >90  >90 mL/min   GFR calc Af Amer >90  >90 mL/min  LIPASE, BLOOD      Result Value Ref Range   Lipase 25  11 - 59 U/L  URINALYSIS, ROUTINE W REFLEX MICROSCOPIC      Result Value Ref Range   Color, Urine YELLOW  YELLOW   APPearance CLEAR  CLEAR   Specific Gravity, Urine 1.015  1.005 - 1.030   pH 7.0  5.0 - 8.0   Glucose, UA NEGATIVE  NEGATIVE mg/dL   Hgb urine dipstick TRACE (*) NEGATIVE   Bilirubin Urine NEGATIVE  NEGATIVE   Ketones, ur NEGATIVE  NEGATIVE mg/dL   Protein, ur NEGATIVE  NEGATIVE mg/dL   Urobilinogen, UA 0.2  0.0 - 1.0 mg/dL   Nitrite NEGATIVE  NEGATIVE   Leukocytes, UA NEGATIVE  NEGATIVE  URINE MICROSCOPIC-ADD ON      Result Value Ref Range   Squamous Epithelial / LPF FEW (*) RARE   WBC, UA 0-2  <3 WBC/hpf   Bacteria, UA FEW (*) RARE   Urine-Other MUCOUS PRESENT    POC URINE PREG, ED      Result Value Ref Range   Preg Test, Ur NEGATIVE  NEGATIVE   Dg Chest 2 View  01/06/2014   CLINICAL DATA:  Chest pain, cough, congestion.  EXAM: CHEST  2 VIEW  COMPARISON:  05/06/2007  FINDINGS: The heart size and mediastinal contours are within normal limits. Both lungs are clear. The visualized skeletal structures are unremarkable.   IMPRESSION: No active cardiopulmonary disease.   Electronically Signed   By: Charlett NoseKevin  Dover M.D.   On: 01/06/2014 13:14   [place] Abdomen Complete  01/08/2014   CLINICAL DATA:  Epigastric pain  EXAM: ULTRASOUND ABDOMEN COMPLETE  COMPARISON:  12/14/4  FINDINGS: Gallbladder:  No gallstones or wall thickening visualized. No sonographic Murphy sign noted.  Common bile duct:  Diameter: 2.1  Liver:  No focal lesion identified. Within normal limits in parenchymal echogenicity.  IVC:  No abnormality visualized.  Pancreas:  Visualized portion unremarkable.  Spleen:  Size and appearance within normal limits.  Right Kidney:  Length: 9.8 cm. Echogenicity within normal limits. No mass or hydronephrosis visualized.  Left Kidney:  Length: 9.8 cm. Echogenicity within normal limits. No mass or hydronephrosis visualized.  Abdominal aorta:  No aneurysm visualized.  Other findings:  None.  IMPRESSION: 1. Normal exam.   Electronically Signed   By: Signa Kellaylor  Stroud M.D.   On: 01/08/2014 18:32     Imaging Review No results found.   EKG Interpretation None      MDM   Final diagnoses:  Headache  Gastroenteritis    Patient seen on May 17, patient here for work note. Patient still recovering from the abdominal pain and the diarrhea illness and developed a headache that was worse during the night but improved some now. Patient does not have a history of migraines. Lab workup and ultrasound workup reviewed from the 17th. Patient will provided a work note. Patient in no acute distress nontoxic.  Vanetta MuldersScott Osmond Steckman, MD 01/12/14 1626

## 2014-01-12 NOTE — ED Notes (Signed)
Pt. Reports N/V/D and was seen here and diagnosed with a stomach virus. Pt. States "the real reason I'm here is that I was sent home from work and they said I need to come to the doctor. I don't have a primary care doctor, and I need a work note to stay home". Pt. Reports HA from dehydration. Did not get prescriptions filled from previous visit since symptoms are slowly getting better.

## 2014-01-12 NOTE — Telephone Encounter (Signed)
Pt calling for work note hasnt been to work since Sunday explained will have to be reassessed for any more than 3 days.

## 2014-02-21 ENCOUNTER — Inpatient Hospital Stay (HOSPITAL_COMMUNITY)
Admission: AD | Admit: 2014-02-21 | Discharge: 2014-02-21 | Disposition: A | Discharge status: Home / Self Care | Payer: Medicaid Other | Source: Ambulatory Visit | Attending: Obstetrics & Gynecology | Admitting: Obstetrics & Gynecology

## 2014-02-21 ENCOUNTER — Encounter (HOSPITAL_COMMUNITY): Payer: Self-pay | Admitting: *Deleted

## 2014-02-21 DIAGNOSIS — IMO0002 Reserved for concepts with insufficient information to code with codable children: Secondary | ICD-10-CM | POA: Insufficient documentation

## 2014-02-21 DIAGNOSIS — R109 Unspecified abdominal pain: Secondary | ICD-10-CM

## 2014-02-21 DIAGNOSIS — K5900 Constipation, unspecified: Secondary | ICD-10-CM | POA: Insufficient documentation

## 2014-02-21 DIAGNOSIS — R1031 Right lower quadrant pain: Secondary | ICD-10-CM | POA: Insufficient documentation

## 2014-02-21 LAB — URINALYSIS, ROUTINE W REFLEX MICROSCOPIC
Bilirubin Urine: NEGATIVE
Glucose, UA: NEGATIVE mg/dL
HGB URINE DIPSTICK: NEGATIVE
KETONES UR: NEGATIVE mg/dL
Leukocytes, UA: NEGATIVE
Nitrite: NEGATIVE
PROTEIN: NEGATIVE mg/dL
Specific Gravity, Urine: 1.015 (ref 1.005–1.030)
Urobilinogen, UA: 0.2 mg/dL (ref 0.0–1.0)
pH: 7.5 (ref 5.0–8.0)

## 2014-02-21 LAB — WET PREP, GENITAL
CLUE CELLS WET PREP: NONE SEEN
Trich, Wet Prep: NONE SEEN
Yeast Wet Prep HPF POC: NONE SEEN

## 2014-02-21 LAB — POCT PREGNANCY, URINE: Preg Test, Ur: NEGATIVE

## 2014-02-21 NOTE — MAU Provider Note (Signed)
History    Kara Fernandez is a 23 year old non-pregnant African American female with a history of HSV2 recent (01/08/14 ED visit) gastroenteritis presents today today for RLQ "pressure." Patient reports the pressure started 4-5 days ago, does not radiate and has remained unchanged until today after having a bowel movement. She reports taking Motrin one time, earlier today, to try to relive the pressure but did not notice a change. Patient states she felt like this last time she had a UTI. She denies urinary frequency, dysuria and hematuria. Endorses holding her urine for extended periods of time due to work. Patient also endorses constipation. After 2-3 days without a bowel movement, she was able to have one today and said that this helped relieve the pressure. Also endorses dyspareunia and not adequately hydrating. Denies diarrhea and change in diet.  CSN: [id number]  Arrival date and time: 02/21/14 1419   First Provider Initiated Contact with Patient 02/21/14 1521      Chief Complaint  Patient presents with  . Abdominal Pain  pt is not pregnant and presents with lower abd pain.   Abdominal Pain This is a new problem. Episode onset: 4 days. The onset quality is undetermined. The problem occurs daily. The problem has been resolved. The pain is located in the RLQ. The pain is mild. The quality of the pain is dull. The abdominal pain does not radiate. Associated symptoms include constipation. Pertinent negatives include no diarrhea, dysuria, frequency or hematuria. Exacerbated by: intercourse. The pain is relieved by bowel movements. Treatments tried: Took motrin one time earlier today. The treatment provided no relief.   Pt is not having the at this time. Pt denies abnormal vaginal discharge, itching or burning.  Pt is not using anything for contraception; pt is not interested in contraception at this time and would be OK with getting pregnant. Pt's LMP was 02/01/2014.  Pt has hx of GC, Trich and  HSV2- pt uses Valtrex episodically for outbreaks that occur about once yearly.     Past Medical History  Diagnosis Date  . Chlamydia infection     POSITIVE 10/06,8/07,7/09,11/11 ;at age 23  . ASCUS on Pap smear     C&B 10/09 W/LGSIL  . HSV (herpes simplex virus) infection   . 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 Gonorrhea     POSITIVE 8/06, 10/06; at age 23  . GC (gonococcus infection)   . Trichomonal vulvovaginitis   . Ovarian cyst   . UTI (lower urinary tract infection)   . 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 H/O varicella   . History of high blood pressure   . Yeast infection   . H/O bacterial infection     frequently  . Abnormal Pap smear     completed  at age 23  . Asthma     as a child   . Angina ~2000    enlarged heart, pain due to stress  . Ovarian cyst     Past Surgical History  Procedure Laterality Date  . Dilation and curettage of uterus      abortion  . Cesarean section  12/29/2011    Procedure: CESAREAN SECTION;  Surgeon: Purcell NailsAngela Y Roberts, MD;  Location: [facility];  Service: Gynecology;  Laterality: N/A;    Family History  Problem Relation Age of Onset  . Hypertension Mother   . Hypertension Father   .  Hypertension Maternal Grandmother   . Stroke Maternal Grandmother   . Hypertension Maternal Grandfather   . Hypertension Maternal Aunt   . Hypertension Maternal Uncle   . Alcohol abuse Maternal Uncle   . Alcohol abuse Paternal Uncle     History  Substance Use Topics  . Smoking status: Never Smoker   . Smokeless tobacco: Never Used  . Alcohol Use: 0.6 oz/week    1 Shots of liquor per week     Comment: occasionally    Allergies: No Known Allergies  Prescriptions prior to admission  Medication Sig Dispense Refill  . ibuprofen (ADVIL,MOTRIN) 200 MG tablet Take 400 mg by mouth every 6 (six) hours as needed for mild pain.      . valACYclovir (VALTREX) 500 MG tablet Take 500 mg by mouth  daily as needed (For outbreak.).         Review of Systems  Gastrointestinal: Positive for abdominal pain and constipation. Negative for diarrhea.  Genitourinary: Negative for dysuria, frequency and hematuria.   Physical Exam   Blood pressure 126/80, pulse 74, temperature 98.8 F (37.1 C), temperature source Oral, resp. rate 18, height 5\' 1"  (1.549 m), weight 65.409 kg (144 lb 3.2 oz), last menstrual period 02/01/2014, SpO2 100.00%.  Physical Exam  Constitutional: She is oriented to person, place, and time. She appears well-developed and well-nourished. No distress.  Cardiovascular: Normal rate and regular rhythm.   Respiratory: Effort normal and breath sounds normal. She has no wheezes. She has no rales.  GI: Soft. She exhibits no distension and no mass. There is no tenderness. There is no rebound and no guarding.  Patient reports "pressure" on palpation of RLQ. Denies tenderness to palpation.  Genitourinary: Vagina normal and uterus normal. Pelvic exam was performed with patient supine. There is no rash, tenderness, lesion or injury on the right labia. There is no rash, tenderness, lesion or injury on the left labia.  Vulva without lesions. Vaginal mucosa pink. Cervix pink and with a thin mucous discharge. No tenderness to palpation of uterus or adnexa. No pelvic masses felt.   Neurological: She is alert and oriented to person, place, and time.    MAU Course  Procedures Results for orders placed during the hospital encounter of 02/21/14 (from the past 24 hour(s))  URINALYSIS, ROUTINE W REFLEX MICROSCOPIC     Status: Abnormal   Collection Time    02/21/14  2:30 PM      Result Value Ref Range   Color, Urine YELLOW  YELLOW   APPearance HAZY (*) CLEAR   Specific Gravity, Urine 1.015  1.005 - 1.030   pH 7.5  5.0 - 8.0   Glucose, UA NEGATIVE  NEGATIVE mg/dL   Hgb urine dipstick NEGATIVE  NEGATIVE   Bilirubin Urine NEGATIVE  NEGATIVE   Ketones, ur NEGATIVE  NEGATIVE mg/dL   Protein,  ur NEGATIVE  NEGATIVE mg/dL   Urobilinogen, UA 0.2  0.0 - 1.0 mg/dL   Nitrite NEGATIVE  NEGATIVE   Leukocytes, UA NEGATIVE  NEGATIVE  POCT PREGNANCY, URINE     Status: None   Collection Time    02/21/14  2:36 PM      Result Value Ref Range   Preg Test, Ur NEGATIVE  NEGATIVE  WET PREP, GENITAL     Status: Abnormal   Collection Time    02/21/14  4:00 PM      Result Value Ref Range   Yeast Wet Prep HPF POC NONE SEEN  NONE SEEN   Trich,  Wet Prep NONE SEEN  NONE SEEN   Clue Cells Wet Prep HPF POC NONE SEEN  NONE SEEN   WBC, Wet Prep HPF POC FEW (*) NONE SEEN   GC Chlamydia pending   Assessment and Plan  Abdominal pain Constipation F/u with provider of choice Pt seen with Letta Pate, PA student and agree with evaluation and managment Letta Pate 02/21/2014, 4:09 PM

## 2014-02-21 NOTE — MAU Note (Signed)
Having pain R lateral side and RLQ for couple days. With intercourse, there is no pain but causes a lot of pressure in bladder. Bladder fills full all the time and i pee a lot. NO pain when i pee.

## 2014-02-21 NOTE — Progress Notes (Signed)
Written and verbal d/c instructions given and understanding voiced. 

## 2014-02-22 LAB — GC/CHLAMYDIA PROBE AMP
CT Probe RNA: NEGATIVE
GC PROBE AMP APTIMA: NEGATIVE

## 2014-06-18 ENCOUNTER — Inpatient Hospital Stay (HOSPITAL_COMMUNITY)
Admission: AD | Admit: 2014-06-18 | Discharge: 2014-06-18 | Disposition: A | Discharge status: Home / Self Care | Payer: Medicaid Other | Source: Ambulatory Visit | Attending: Obstetrics & Gynecology | Admitting: Obstetrics & Gynecology

## 2014-06-18 ENCOUNTER — Encounter (HOSPITAL_COMMUNITY): Payer: Self-pay | Admitting: *Deleted

## 2014-06-18 ENCOUNTER — Inpatient Hospital Stay (HOSPITAL_COMMUNITY): Payer: Medicaid Other

## 2014-06-18 DIAGNOSIS — O26891 Other specified pregnancy related conditions, first trimester: Secondary | ICD-10-CM | POA: Insufficient documentation

## 2014-06-18 DIAGNOSIS — Z3A01 Less than 8 weeks gestation of pregnancy: Secondary | ICD-10-CM | POA: Diagnosis not present

## 2014-06-18 DIAGNOSIS — R109 Unspecified abdominal pain: Secondary | ICD-10-CM | POA: Diagnosis not present

## 2014-06-18 DIAGNOSIS — O26899 Other specified pregnancy related conditions, unspecified trimester: Secondary | ICD-10-CM

## 2014-06-18 DIAGNOSIS — O9989 Other specified diseases and conditions complicating pregnancy, childbirth and the puerperium: Secondary | ICD-10-CM

## 2014-06-18 HISTORY — DX: Other specified personal risk factors, not elsewhere classified: Z91.89

## 2014-06-18 HISTORY — DX: Allergy status to unspecified drugs, medicaments and biological substances: Z88.9

## 2014-06-18 LAB — CBC
HCT: 37.3 % (ref 36.0–46.0)
HEMOGLOBIN: 12.5 g/dL (ref 12.0–15.0)
MCH: 22.9 pg — AB (ref 26.0–34.0)
MCHC: 33.5 g/dL (ref 30.0–36.0)
MCV: 68.3 fL — ABNORMAL LOW (ref 78.0–100.0)
PLATELETS: 306 10*3/uL (ref 150–400)
RBC: 5.46 MIL/uL — ABNORMAL HIGH (ref 3.87–5.11)
RDW: 15 % (ref 11.5–15.5)
WBC: 6.7 10*3/uL (ref 4.0–10.5)

## 2014-06-18 LAB — URINALYSIS, ROUTINE W REFLEX MICROSCOPIC
Bilirubin Urine: NEGATIVE
GLUCOSE, UA: NEGATIVE mg/dL
Hgb urine dipstick: NEGATIVE
KETONES UR: NEGATIVE mg/dL
Leukocytes, UA: NEGATIVE
NITRITE: NEGATIVE
PROTEIN: NEGATIVE mg/dL
Specific Gravity, Urine: 1.02 (ref 1.005–1.030)
UROBILINOGEN UA: 1 mg/dL (ref 0.0–1.0)
pH: 6.5 (ref 5.0–8.0)

## 2014-06-18 LAB — WET PREP, GENITAL
Clue Cells Wet Prep HPF POC: NONE SEEN
Trich, Wet Prep: NONE SEEN
YEAST WET PREP: NONE SEEN

## 2014-06-18 LAB — OB RESULTS CONSOLE GC/CHLAMYDIA
CHLAMYDIA, DNA PROBE: NEGATIVE
Gonorrhea: NEGATIVE

## 2014-06-18 LAB — HIV ANTIBODY (ROUTINE TESTING W REFLEX): HIV 1&2 Ab, 4th Generation: NONREACTIVE

## 2014-06-18 LAB — POCT PREGNANCY, URINE: PREG TEST UR: POSITIVE — AB

## 2014-06-18 LAB — HCG, QUANTITATIVE, PREGNANCY: hCG, Beta Chain, Quant, S: 387 m[IU]/mL — ABNORMAL HIGH (ref <5)

## 2014-06-18 MED ORDER — PRENATAL VITAMINS 28-0.8 MG PO TABS: 1.0000 | ORAL_TABLET | Freq: Every day | ORAL | Status: DC

## 2014-06-18 NOTE — MAU Provider Note (Signed)
Chief Complaint: Back Pain   None    SUBJECTIVE HPI: Kara Fernandez is a 23 y.o. G3P1011 at [redacted]w[redacted]d by LMP who presents to maternity admissions reporting back pain and mild abdominal cramping.  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  She denies vaginal bleeding, vaginal itching/burning, urinary symptoms, h/a, dizziness, n/v, or fever/chills.    Past Medical History  Diagnosis Date  . Chlamydia infection     POSITIVE 10/06,8/07,7/09,11/11 ;at age 70  . ASCUS on Pap smear     C&B 10/09 W/LGSIL  . HSV (herpes simplex virus) infection   . 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 Gonorrhea     POSITIVE 8/06, 10/06; at age 35  . GC (gonococcus infection)   . Trichomonal vulvovaginitis   . Ovarian cyst   . UTI (lower urinary tract infection)   . 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 H/O varicella   . History of high blood pressure   . Yeast infection   . H/O bacterial infection     frequently  . Abnormal Pap smear     completed  at age 46  . Asthma     as a child   . Angina ~2000    enlarged heart, pain due to stress  . Ovarian cyst   . H/O multiple allergies    Past Surgical History  Procedure Laterality Date  . Dilation and curettage of uterus      abortion  . Cesarean section  12/29/2011    Procedure: CESAREAN SECTION;  Surgeon: Purcell Nails, MD;  Location: [facility];  Service: Gynecology;  Laterality: N/A;   History   Social History  . Marital Status: Single    Spouse Name: N/A    Number of Children: N/A  . Years of Education: N/A   Occupational History  . Not on file.   Social History Main Topics  . Smoking status: Never Smoker   . Smokeless tobacco: Never Used  . Alcohol Use: 0.6 oz/week    1 Shots of liquor per week     Comment: occasionally  . Drug Use: Yes    Special: Marijuana     Comment: 08/24/13  . Sexual Activity: Yes    Birth Control/ Protection: None   Other Topics  Concern  . Not on file   Social History Narrative  . No narrative on file   No current facility-administered medications on file prior to encounter.   Current Outpatient Prescriptions on File Prior to Encounter  Medication Sig Dispense Refill  . valACYclovir (VALTREX) 500 MG tablet Take 500 mg by mouth daily as needed (For outbreak.).        No Known Allergies  ROS: Pertinent items in HPI  OBJECTIVE Blood pressure 119/59, pulse 77, temperature 98.5 F (36.9 C), temperature source Oral, resp. rate 18, height 5\' 1"  (1.549 m), weight 61.236 kg (135 lb), last menstrual period 05/23/2014. GENERAL: Well-developed, well-nourished female in no acute distress.  HEENT: Normocephalic HEART: normal rate RESP: normal effort ABDOMEN: Soft, non-tender EXTREMITIES: Nontender, no edema NEURO: Alert and oriented Pelvic exam: Cervix pink, visually closed, without lesion, scant white creamy discharge, vaginal walls and external genitalia normal Bimanual exam: Cervix 0/long/high, firm, anterior, neg CMT, uterus nontender, nonenlarged, adnexa without tenderness, enlargement, or mass  LAB RESULTS Results for orders placed during the hospital encounter of 06/18/14 (  from the past 24 hour(s))  URINALYSIS, ROUTINE W REFLEX MICROSCOPIC     Status: None   Collection Time    06/18/14  8:50 AM      Result Value Ref Range   Color, Urine YELLOW  YELLOW   APPearance CLEAR  CLEAR   Specific Gravity, Urine 1.020  1.005 - 1.030   pH 6.5  5.0 - 8.0   Glucose, UA NEGATIVE  NEGATIVE mg/dL   Hgb urine dipstick NEGATIVE  NEGATIVE   Bilirubin Urine NEGATIVE  NEGATIVE   Ketones, ur NEGATIVE  NEGATIVE mg/dL   Protein, ur NEGATIVE  NEGATIVE mg/dL   Urobilinogen, UA 1.0  0.0 - 1.0 mg/dL   Nitrite NEGATIVE  NEGATIVE   Leukocytes, UA NEGATIVE  NEGATIVE  POCT PREGNANCY, URINE     Status: Abnormal   Collection Time    06/18/14  8:58 AM      Result Value Ref Range   Preg Test, Ur POSITIVE (*) NEGATIVE  WET PREP,  GENITAL     Status: Abnormal   Collection Time    06/18/14  9:45 AM      Result Value Ref Range   Yeast Wet Prep HPF POC NONE SEEN  NONE SEEN   Trich, Wet Prep NONE SEEN  NONE SEEN   Clue Cells Wet Prep HPF POC NONE SEEN  NONE SEEN   WBC, Wet Prep HPF POC FEW (*) NONE SEEN  CBC     Status: Abnormal   Collection Time    06/18/14 10:00 AM      Result Value Ref Range   WBC 6.7  4.0 - 10.5 K/uL   RBC 5.46 (*) 3.87 - 5.11 MIL/uL   Hemoglobin 12.5  12.0 - 15.0 g/dL   HCT 78.437.3  69.636.0 - 29.546.0 %   MCV 68.3 (*) 78.0 - 100.0 fL   MCH 22.9 (*) 26.0 - 34.0 pg   MCHC 33.5  30.0 - 36.0 g/dL   RDW 28.415.0  13.211.5 - 44.015.5 %   Platelets 306  150 - 400 K/uL  HCG, QUANTITATIVE, PREGNANCY     Status: Abnormal   Collection Time    06/18/14 10:00 AM      Result Value Ref Range   hCG, Beta Chain, Quant, S 387 (*) <5 mIU/mL    IMAGING [place] Ob Comp Less 14 Wks  06/18/2014   CLINICAL DATA:  Abdominal pain. Pregnant patient. Beta HCG level 387. Patient also complaining of back pain.  EXAM: OBSTETRIC <14 WK US AND TRANSVAGINAL OB US  TECHNIQUE: Both transabdominal and transvaginal ultrasound examinations were performed for complete evaluation of the gestation as well as the maternal uterus, adnexal regions, and pelvic cul-de-sac. Transvaginal technique was performed to assess early pregnancy.  COMPARISON:  None.  FINDINGS: Intrauterine gestational sac: Not visualized  Yolk sac:  None  Embryo:  None  Uterus: No uterine masses. Endometrium mildly prominent measuring 9 mm in thickness. Cervix unremarkable.  Adnexum: Normal ovaries. No adnexal masses. Trace, likely physiologic, pelvic free fluid.  IMPRESSION: 1. Normal exam. No evidence of an intrauterine pregnancy or ectopic pregnancy. 2. Findings are likely due to the early stage in pregnancy. This does not exclude failed pregnancy or ectopic pregnancy. Patient warrants followup imaging. Recommend serial beta HCG and follow-up ultrasound in 7-10 days to document normal  pregnancy progression.   Electronically Signed   By: Amie Portlandavid  Ormond M.D.   On: 06/18/2014 12:06   [place] Ob Transvaginal  06/18/2014   CLINICAL DATA:  Abdominal  pain. Pregnant patient. Beta HCG level 387. Patient also complaining of back pain.  EXAM: OBSTETRIC <14 WK US AND TRANSVAGINAL OB US  TECHNIQUE: Both transabdominal and transvaginal ultrasound examinations were performed for complete evaluation of the gestation as well as the maternal uterus, adnexal regions, and pelvic cul-de-sac. Transvaginal technique was performed to assess early pregnancy.  COMPARISON:  None.  FINDINGS: Intrauterine gestational sac: Not visualized  Yolk sac:  None  Embryo:  None  Uterus: No uterine masses. Endometrium mildly prominent measuring 9 mm in thickness. Cervix unremarkable.  Adnexum: Normal ovaries. No adnexal masses. Trace, likely physiologic, pelvic free fluid.  IMPRESSION: 1. Normal exam. No evidence of an intrauterine pregnancy or ectopic pregnancy. 2. Findings are likely due to the early stage in pregnancy. This does not exclude failed pregnancy or ectopic pregnancy. Patient warrants followup imaging. Recommend serial beta HCG and follow-up ultrasound in 7-10 days to document normal pregnancy progression.   Electronically Signed   By: Amie Portlandavid  Ormond M.D.   On: 06/18/2014 12:06    ASSESSMENT 1. Abdominal pain affecting pregnancy     PLAN Discharge home with ectopic precautions Discussed wearing good supportive shoes, sitting at work when possible, pregnancy support belt later in pregnancy Return to MAU in 48 hours for repeat quant hcg    Medication List         acetaminophen 500 MG tablet  Commonly known as:  TYLENOL  Take 1,000 mg by mouth every 6 (six) hours as needed for headache.     Prenatal Vitamins 28-0.8 MG Tabs  Take 1 tablet by mouth daily.     valACYclovir 500 MG tablet  Commonly known as:  VALTREX  Take 500 mg by mouth daily as needed (For outbreak.).       Follow-up Information    Please follow up. (Prenatal provider of your choice)       [facility]. (On Tuesday after 10 am for repeat labs.)    Contact information:   [address] [id number] [place] [id number] [phone]      Sharen CounterLisa Leftwich-Kirby Certified Nurse-Midwife 06/18/2014  1:14 PM

## 2014-06-18 NOTE — MAU Note (Signed)
Pt presents to MAU with complaints of having back pain for a couple of weeks. Reports she may be pregnant

## 2014-06-18 NOTE — MAU Provider Note (Signed)
Attestation of Attending Supervision of Advanced Practitioner (PA/CNM/NP): Evaluation and management procedures were performed by the Advanced Practitioner under my supervision and collaboration.  I have reviewed the Advanced Practitioner's note and chart, and I agree with the management and plan.  Imagene Boss, MD, FACOG Attending Obstetrician & Gynecologist Faculty Practice, [facility] -    

## 2014-06-18 NOTE — Discharge Instructions (Signed)

## 2014-06-19 LAB — GC/CHLAMYDIA PROBE AMP
CT Probe RNA: NEGATIVE
GC Probe RNA: NEGATIVE

## 2014-06-20 ENCOUNTER — Inpatient Hospital Stay (HOSPITAL_COMMUNITY)
Admission: AD | Admit: 2014-06-20 | Discharge: 2014-06-20 | Disposition: A | Discharge status: Home / Self Care | Payer: Medicaid Other | Source: Ambulatory Visit | Attending: Family Medicine | Admitting: Family Medicine

## 2014-06-20 DIAGNOSIS — Z3A01 Less than 8 weeks gestation of pregnancy: Secondary | ICD-10-CM | POA: Diagnosis not present

## 2014-06-20 DIAGNOSIS — Z3491 Encounter for supervision of normal pregnancy, unspecified, first trimester: Secondary | ICD-10-CM | POA: Insufficient documentation

## 2014-06-20 DIAGNOSIS — O26899 Other specified pregnancy related conditions, unspecified trimester: Secondary | ICD-10-CM

## 2014-06-20 DIAGNOSIS — O9989 Other specified diseases and conditions complicating pregnancy, childbirth and the puerperium: Secondary | ICD-10-CM

## 2014-06-20 DIAGNOSIS — R109 Unspecified abdominal pain: Secondary | ICD-10-CM

## 2014-06-20 LAB — HCG, QUANTITATIVE, PREGNANCY: hCG, Beta Chain, Quant, S: 851 m[IU]/mL — ABNORMAL HIGH (ref <5)

## 2014-06-20 NOTE — MAU Provider Note (Signed)
History   Chief Complaint:  Follow-up   Kara Fernandez is  23 y.o. G3P1011 Patient's last menstrual period was 05/23/2014.Marland Kitchen. Patient is here for follow up of quantitative HCG and ongoing surveillance of pregnancy status.   She is 673w0d weeks gestation  by LMP.    Since her last visit, the patient is without new complaint.   The patient reports bleeding as  none now.  Denies abd pain.  General ROS:  negative  Her previous Quantitative HCG values are:  06/18/14: 387  [facility] Has not applied for pregnancy Medicaid.   Physical Exam   Blood pressure 124/63, pulse 78, temperature 98.4 F (36.9 C), temperature source Oral, resp. rate 16, last menstrual period 05/23/2014.  Focused Gynecological Exam: examination not indicated  Labs: Results for orders placed during the hospital encounter of 06/20/14 (from the past 24 hour(s))  HCG, QUANTITATIVE, PREGNANCY   Collection Time    06/20/14  9:40 AM      Result Value Ref Range   hCG, Beta Chain, Quant, S 851 (*) <5 mIU/mL    Ultrasound Studies:   [place] Ob Comp Less 14 Wks  06/18/2014   CLINICAL DATA:  Abdominal pain. Pregnant patient. Beta HCG level 387. Patient also complaining of back pain.  EXAM: OBSTETRIC <14 WK US AND TRANSVAGINAL OB US  TECHNIQUE: Both transabdominal and transvaginal ultrasound examinations were performed for complete evaluation of the gestation as well as the maternal uterus, adnexal regions, and pelvic cul-de-sac. Transvaginal technique was performed to assess early pregnancy.  COMPARISON:  None.  FINDINGS: Intrauterine gestational sac: Not visualized  Yolk sac:  None  Embryo:  None  Uterus: No uterine masses. Endometrium mildly prominent measuring 9 mm in thickness. Cervix unremarkable.  Adnexum: Normal ovaries. No adnexal masses. Trace, likely physiologic, pelvic free fluid.  IMPRESSION: 1. Normal exam. No evidence of an intrauterine pregnancy or ectopic pregnancy. 2. Findings are likely due to the  early stage in pregnancy. This does not exclude failed pregnancy or ectopic pregnancy. Patient warrants followup imaging. Recommend serial beta HCG and follow-up ultrasound in 7-10 days to document normal pregnancy progression.   Electronically Signed   By: Amie Portlandavid  Ormond M.D.   On: 06/18/2014 12:06   [place] Ob Transvaginal  06/18/2014   CLINICAL DATA:  Abdominal pain. Pregnant patient. Beta HCG level 387. Patient also complaining of back pain.  EXAM: OBSTETRIC <14 WK US AND TRANSVAGINAL OB US  TECHNIQUE: Both transabdominal and transvaginal ultrasound examinations were performed for complete evaluation of the gestation as well as the maternal uterus, adnexal regions, and pelvic cul-de-sac. Transvaginal technique was performed to assess early pregnancy.  COMPARISON:  None.  FINDINGS: Intrauterine gestational sac: Not visualized  Yolk sac:  None  Embryo:  None  Uterus: No uterine masses. Endometrium mildly prominent measuring 9 mm in thickness. Cervix unremarkable.  Adnexum: Normal ovaries. No adnexal masses. Trace, likely physiologic, pelvic free fluid.  IMPRESSION: 1. Normal exam. No evidence of an intrauterine pregnancy or ectopic pregnancy. 2. Findings are likely due to the early stage in pregnancy. This does not exclude failed pregnancy or ectopic pregnancy. Patient warrants followup imaging. Recommend serial beta HCG and follow-up ultrasound in 7-10 days to document normal pregnancy progression.   Electronically Signed   By: Amie Portlandavid  Ormond M.D.   On: 06/18/2014 12:06    Assessment:  10373w0d weeks gestation  Normal rise in quants.   Plan: The patient is instructed to follow up in in 2 weeks  for dating/viability US.  Kara Fernandez 06/20/2014, 10:19 AM

## 2014-06-20 NOTE — MAU Provider Note (Signed)
Attestation of Attending Supervision of Advanced Practitioner (PA/CNM/NP): Evaluation and management procedures were performed by the Advanced Practitioner under my supervision and collaboration.  I have reviewed the Advanced Practitioner's note and chart, and I agree with the management and plan.  Cainen Burnham, DO Attending Physician Faculty Practice, [facility]  

## 2014-06-20 NOTE — MAU Note (Signed)
Feeling good. Just tired.  Usual back pain.

## 2014-06-20 NOTE — Discharge Instructions (Signed)
First Trimester of Pregnancy The first trimester of pregnancy is from week 1 until the end of week 12 (months 1 through 3). A week after a sperm fertilizes an egg, the egg will implant on the wall of the uterus. This embryo will begin to develop into a baby. Genes from you and your partner are forming the baby. The female genes determine whether the baby is a boy or a girl. At 6-8 weeks, the eyes and face are formed, and the heartbeat can be seen on ultrasound. At the end of 12 weeks, all the baby's organs are formed.  Now that you are pregnant, you will want to do everything you can to have a healthy baby. Two of the most important things are to get good prenatal care and to follow your health care provider's instructions. Prenatal care is all the medical care you receive before the baby's birth. This care will help prevent, find, and treat any problems during the pregnancy and childbirth. BODY CHANGES Your body goes through many changes during pregnancy. The changes vary from woman to woman.   You may gain or lose a couple of pounds at first.  You may feel sick to your stomach (nauseous) and throw up (vomit). If the vomiting is uncontrollable, call your health care provider.  You may tire easily.  You may develop headaches that can be relieved by medicines approved by your health care provider.  You may urinate more often. Painful urination may mean you have a bladder infection.  You may develop heartburn as a result of your pregnancy.  You may develop constipation because certain hormones are causing the muscles that push waste through your intestines to slow down.  You may develop hemorrhoids or swollen, bulging veins (varicose veins).  Your breasts may begin to grow larger and become tender. Your nipples may stick out more, and the tissue that surrounds them (areola) may become darker.  Your gums may bleed and may be sensitive to brushing and flossing.  Dark spots or blotches (chloasma,  mask of pregnancy) may develop on your face. This will likely fade after the baby is born.  Your menstrual periods will stop.  You may have a loss of appetite.  You may develop cravings for certain kinds of food.  You may have changes in your emotions from day to day, such as being excited to be pregnant or being concerned that something may go wrong with the pregnancy and baby.  You may have more vivid and strange dreams.  You may have changes in your hair. These can include thickening of your hair, rapid growth, and changes in texture. Some women also have hair loss during or after pregnancy, or hair that feels dry or thin. Your hair will most likely return to normal after your baby is born. WHAT TO EXPECT AT YOUR PRENATAL VISITS During a routine prenatal visit:  You will be weighed to make sure you and the baby are growing normally.  Your blood pressure will be taken.  Your abdomen will be measured to track your baby's growth.  The fetal heartbeat will be listened to starting around week 10 or 12 of your pregnancy.  Test results from any previous visits will be discussed. Your health care provider may ask you:  How you are feeling.  If you are feeling the baby move.  If you have had any abnormal symptoms, such as leaking fluid, bleeding, severe headaches, or abdominal cramping.  If you have any questions. Other tests   that may be performed during your first trimester include:  Blood tests to find your blood type and to check for the presence of any previous infections. They will also be used to check for low iron levels (anemia) and Rh antibodies. Later in the pregnancy, blood tests for diabetes will be done along with other tests if problems develop.  Urine tests to check for infections, diabetes, or protein in the urine.  An ultrasound to confirm the proper growth and development of the baby.  An amniocentesis to check for possible genetic problems.  Fetal screens for  spina bifida and Down syndrome.  You may need other tests to make sure you and the baby are doing well. HOME CARE INSTRUCTIONS  Medicines  Follow your health care provider's instructions regarding medicine use. Specific medicines may be either safe or unsafe to take during pregnancy.  Take your prenatal vitamins as directed.  If you develop constipation, try taking a stool softener if your health care provider approves. Diet  Eat regular, well-balanced meals. Choose a variety of foods, such as meat or vegetable-based protein, fish, milk and low-fat dairy products, vegetables, fruits, and whole grain breads and cereals. Your health care provider will help you determine the amount of weight gain that is right for you.  Avoid raw meat and uncooked cheese. These carry germs that can cause birth defects in the baby.  Eating four or five small meals rather than three large meals a day may help relieve nausea and vomiting. If you start to feel nauseous, eating a few soda crackers can be helpful. Drinking liquids between meals instead of during meals also seems to help nausea and vomiting.  If you develop constipation, eat more high-fiber foods, such as fresh vegetables or fruit and whole grains. Drink enough fluids to keep your urine clear or pale yellow. Activity and Exercise  Exercise only as directed by your health care provider. Exercising will help you:  Control your weight.  Stay in shape.  Be prepared for labor and delivery.  Experiencing pain or cramping in the lower abdomen or low back is a good sign that you should stop exercising. Check with your health care provider before continuing normal exercises.  Try to avoid standing for long periods of time. Move your legs often if you must stand in one place for a long time.  Avoid heavy lifting.  Wear low-heeled shoes, and practice good posture.  You may continue to have sex unless your health care provider directs you  otherwise. Relief of Pain or Discomfort  Wear a good support bra for breast tenderness.   Take warm sitz baths to soothe any pain or discomfort caused by hemorrhoids. Use hemorrhoid cream if your health care provider approves.   Rest with your legs elevated if you have leg cramps or low back pain.  If you develop varicose veins in your legs, wear support hose. Elevate your feet for 15 minutes, 3-4 times a day. Limit salt in your diet. Prenatal Care  Schedule your prenatal visits by the twelfth week of pregnancy. They are usually scheduled monthly at first, then more often in the last 2 months before delivery.  Write down your questions. Take them to your prenatal visits.  Keep all your prenatal visits as directed by your health care provider. Safety  Wear your seat belt at all times when driving.  Make a list of emergency phone numbers, including numbers for family, friends, the hospital, and police and fire departments. General Tips    Ask your health care provider for a referral to a local prenatal education class. Begin classes no later than at the beginning of month 6 of your pregnancy.  Ask for help if you have counseling or nutritional needs during pregnancy. Your health care provider can offer advice or refer you to specialists for help with various needs.  Do not use hot tubs, steam rooms, or saunas.  Do not douche or use tampons or scented sanitary pads.  Do not cross your legs for long periods of time.  Avoid cat litter boxes and soil used by cats. These carry germs that can cause birth defects in the baby and possibly loss of the fetus by miscarriage or stillbirth.  Avoid all smoking, herbs, alcohol, and medicines not prescribed by your health care provider. Chemicals in these affect the formation and growth of the baby.  Schedule a dentist appointment. At home, brush your teeth with a soft toothbrush and be gentle when you floss. SEEK MEDICAL CARE IF:   You have  dizziness.  You have mild pelvic cramps, pelvic pressure, or nagging pain in the abdominal area.  You have persistent nausea, vomiting, or diarrhea.  You have a bad smelling vaginal discharge.  You have pain with urination.  You notice increased swelling in your face, hands, legs, or ankles. SEEK IMMEDIATE MEDICAL CARE IF:   You have a fever.  You are leaking fluid from your vagina.  You have spotting or bleeding from your vagina.  You have severe abdominal cramping or pain.  You have rapid weight gain or loss.  You vomit blood or material that looks like coffee grounds.  You are exposed to German measles and have never had them.  You are exposed to fifth disease or chickenpox.  You develop a severe headache.  You have shortness of breath.  You have any kind of trauma, such as from a fall or a car accident. Document Released: 08/05/2001 Document Revised: 12/26/2013 Document Reviewed: 06/21/2013 Exit[employer] Patient Information 2015 [employer]. This information is not intended to replace advice given to you by your health care provider. Make sure you discuss any questions you have with your health care provider.  

## 2014-06-26 ENCOUNTER — Encounter (HOSPITAL_COMMUNITY): Payer: Self-pay | Admitting: *Deleted

## 2014-06-29 ENCOUNTER — Inpatient Hospital Stay (HOSPITAL_COMMUNITY)
Admission: AD | Admit: 2014-06-29 | Discharge: 2014-06-30 | Disposition: A | Discharge status: Home / Self Care | Payer: Medicaid Other | Source: Ambulatory Visit | Attending: Obstetrics and Gynecology | Admitting: Obstetrics and Gynecology

## 2014-06-29 DIAGNOSIS — Z3A01 Less than 8 weeks gestation of pregnancy: Secondary | ICD-10-CM | POA: Insufficient documentation

## 2014-06-29 DIAGNOSIS — O209 Hemorrhage in early pregnancy, unspecified: Secondary | ICD-10-CM

## 2014-06-29 DIAGNOSIS — O328XX0 Maternal care for other malpresentation of fetus, not applicable or unspecified: Secondary | ICD-10-CM | POA: Insufficient documentation

## 2014-06-29 DIAGNOSIS — O468X1 Other antepartum hemorrhage, first trimester: Secondary | ICD-10-CM | POA: Insufficient documentation

## 2014-06-29 NOTE — MAU Note (Signed)
Pt states she started having vaginal bleeding about 5 minutes ago. Denies pain.

## 2014-06-30 ENCOUNTER — Encounter (HOSPITAL_COMMUNITY): Payer: Self-pay | Admitting: *Deleted

## 2014-06-30 DIAGNOSIS — O328XX0 Maternal care for other malpresentation of fetus, not applicable or unspecified: Secondary | ICD-10-CM | POA: Diagnosis not present

## 2014-06-30 DIAGNOSIS — O468X1 Other antepartum hemorrhage, first trimester: Secondary | ICD-10-CM | POA: Diagnosis not present

## 2014-06-30 DIAGNOSIS — Z3A01 Less than 8 weeks gestation of pregnancy: Secondary | ICD-10-CM | POA: Diagnosis not present

## 2014-06-30 MED ORDER — PROMETHAZINE HCL 25 MG PO TABS
25.0000 mg | ORAL_TABLET | Freq: Four times a day (QID) | ORAL | Status: DC | PRN
Start: 2014-06-30 — End: 2014-07-10

## 2014-06-30 NOTE — MAU Provider Note (Signed)
History     CSN: [id number]  Arrival date and time: 06/29/14 2351   First Provider Initiated Contact with Patient 06/30/14 0103      Chief Complaint  Patient presents with  . Vaginal Bleeding   HPI   Pt is a 23 y/o G3P1 at 5325w3d who presents with vaginal bleeding. Patient reports vaginal bleeding after intercourse. She went to the bathroom after intercourse and noticed blood when she wiped. She wore a panty liner on the way to the MAU and noticed a few spots on the liner. She has had no other bleeding since. She denies any abdominal pain, dysuria, hematuria, vaginal discharge, fever, or chills. She reports nausea and vomiting. Her LMP was 9/28. She is taking prenatal vitamins and [facility]   She was seen in the MAU on 10/25 for mild abdominal cramping where she had an U/S done that was too early to show IUP. She was scheduled to come back in 2 weeks for f/u U/S. She had a beta hCG done at the 10/25 visit  which was 387. She came back in 2 days for another hCG which was 851.    OB History    Gravida Para Term Preterm AB TAB SAB Ectopic Multiple Living   3 1 1  1 1    1       Past Medical History  Diagnosis Date  . Chlamydia infection     POSITIVE 10/06,8/07,7/09,11/11 ;at age 23  . ASCUS on Pap smear     C&B 10/09 W/LGSIL  . HSV (herpes simplex virus) infection   . 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 Gonorrhea     POSITIVE 8/06, 10/06; at age 23  . GC (gonococcus infection)   . Trichomonal vulvovaginitis   . Ovarian cyst   . UTI (lower urinary tract infection)   . 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 H/O varicella   . History of high blood pressure   . Yeast infection   . H/O bacterial infection     frequently  . Abnormal Pap smear     completed  at age 23  . Asthma     as a child   . Angina ~2000    enlarged heart, pain due to stress  . Ovarian cyst   . H/O multiple allergies      Past Surgical History  Procedure Laterality Date  . Dilation and curettage of uterus      abortion  . Cesarean section  12/29/2011    Procedure: CESAREAN SECTION;  Surgeon: Purcell NailsAngela Y Roberts, MD;  Location: [facility];  Service: Gynecology;  Laterality: N/A;    Family History  Problem Relation Age of Onset  . Hypertension Mother   . Hypertension Father   . Hypertension Maternal Grandmother   . Stroke Maternal Grandmother   . Hypertension Maternal Grandfather   . Hypertension Maternal Aunt   . Hypertension Maternal Uncle   . Alcohol abuse Maternal Uncle   . Alcohol abuse Paternal Uncle     History  Substance Use Topics  . Smoking status: Never Smoker   . Smokeless tobacco: Never Used  . Alcohol Use: 0.6 oz/week    1 Shots of liquor per week     Comment: occasionally    Allergies: No Known Allergies  Prescriptions prior to admission  Medication Sig Dispense Refill Last Dose  .  Prenatal Vit-Fe Fumarate-FA (PRENATAL VITAMINS) 28-0.8 MG TABS Take 1 tablet by mouth daily. 30 tablet 5 06/29/2014 at Unknown time  . valACYclovir (VALTREX) 500 MG tablet Take 500 mg by mouth daily as needed (For outbreak.).    Past Month at Unknown time  . acetaminophen (TYLENOL) 500 MG tablet Take 1,000 mg by mouth every 6 (six) hours as needed for headache.   More than a month at Unknown time    Review of Systems  Constitutional: Negative for fever and chills.  Respiratory: Negative for shortness of breath.   Cardiovascular: Negative for chest pain.  Gastrointestinal: Positive for nausea and vomiting. Negative for abdominal pain, diarrhea, constipation and blood in stool.  Genitourinary: Negative for dysuria and hematuria.   Physical Exam   Blood pressure 117/70, pulse 86, temperature 98.7 F (37.1 C), temperature source Oral, resp. rate 16, height 5\' 1"  (1.549 m), weight 64.411 kg (142 lb), last menstrual period 05/23/2014, SpO2 100 %.  Physical Exam  Constitutional: She is oriented to  person, place, and time. She appears well-developed and well-nourished.  Cardiovascular: Normal rate, regular rhythm and normal heart sounds.   Respiratory: Effort normal and breath sounds normal. No respiratory distress. She has no wheezes. She has no rales.  GI: Soft. Bowel sounds are normal. She exhibits no distension. There is no tenderness. There is no rebound and no guarding.  Genitourinary: Cervix exhibits no discharge and no friability. There is bleeding in the vagina. No erythema or tenderness in the vagina. No vaginal discharge found.  1/2 fox swab of brown colored blood in vaginal vault. No active bleeding.   Musculoskeletal: She exhibits no edema or tenderness.  Neurological: She is alert and oriented to person, place, and time.   No results found for this or any previous visit (from the past 24 hour(s)).  MAU Course  Procedures  - Pelvic exam: brown colored blood in vaginal vault.  - GC/C cultures not collected due to negative results from 10/25 - U/S from 10/25: No evidence of IUP or ectopic pregnancy most likely due to early stage in pregnancy. F/u U/S to document normal pregnancy progression.   Assessment and Plan  A: 23 y/o G3P1 with vaginal bleeding 2/2 to intercourse.  Unlikely due to miscarriage due to post-coital presentation and lack of abdominal cramping.  P: D/C home with ectopic precautions - F/u at MAU on 11/12 for dating/viability U/S  - Discussed BRAT diet, eating small meals to help with nausea - Rx Phenergan 25 mg po q6-8h prn N/V  Janalyn RouseHoward, Tayla 06/30/2014, 1:21 AM  Delbert PhenixLinda M Albaraa Swingle, NP

## 2014-06-30 NOTE — Discharge Instructions (Signed)
Vaginal Bleeding During Pregnancy, First Trimester  A small amount of bleeding (spotting) from the vagina is relatively common in early pregnancy. It usually stops on its own. Various things may cause bleeding or spotting in early pregnancy. Some bleeding may be related to the pregnancy, and some may not. In most cases, the bleeding is normal and is not a problem. However, bleeding can also be a sign of something serious. Be sure to tell your health care provider about any vaginal bleeding right away.  Some possible causes of vaginal bleeding during the first trimester include:  · Infection or inflammation of the cervix.  · Growths (polyps) on the cervix.  · Miscarriage or threatened miscarriage.  · Pregnancy tissue has developed outside of the uterus and in a fallopian tube (tubal pregnancy).  · Tiny cysts have developed in the uterus instead of pregnancy tissue (molar pregnancy).  HOME CARE INSTRUCTIONS   Watch your condition for any changes. The following actions may help to lessen any discomfort you are feeling:  · Follow your health care provider's instructions for limiting your activity. If your health care provider orders bed rest, you may need to stay in bed and only get up to use the bathroom. However, your health care provider may allow you to continue light activity.  · If needed, make plans for someone to help with your regular activities and responsibilities while you are on bed rest.  · Keep track of the number of pads you use each day, how often you change pads, and how soaked (saturated) they are. Write this down.  · Do not use tampons. Do not douche.  · Do not have sexual intercourse or orgasms until approved by your health care provider.  · If you pass any tissue from your vagina, save the tissue so you can show it to your health care provider.  · Only take over-the-counter or prescription medicines as directed by your health care provider.  · Do not take aspirin because it can make you  bleed.  · Keep all follow-up appointments as directed by your health care provider.  SEEK MEDICAL CARE IF:  · You have any vaginal bleeding during any part of your pregnancy.  · You have cramps or labor pains.  · You have a fever, not controlled by medicine.  SEEK IMMEDIATE MEDICAL CARE IF:   · You have severe cramps in your back or belly (abdomen).  · You pass large clots or tissue from your vagina.  · Your bleeding increases.  · You feel light-headed or weak, or you have fainting episodes.  · You have chills.  · You are leaking fluid or have a gush of fluid from your vagina.  · You pass out while having a bowel movement.  MAKE SURE YOU:  · Understand these instructions.  · Will watch your condition.  · Will get help right away if you are not doing well or get worse.  Document Released: 05/21/2005 Document Revised: 08/16/2013 Document Reviewed: 04/18/2013  ExitCare® Patient Information ©2015 [employer], LLC. This information is not intended to replace advice given to you by your health care provider. Make sure you discuss any questions you have with your health care provider.

## 2014-07-06 ENCOUNTER — Encounter: Payer: Self-pay | Admitting: Physician Assistant

## 2014-07-06 ENCOUNTER — Telehealth: Payer: Self-pay | Admitting: Physician Assistant

## 2014-07-06 ENCOUNTER — Ambulatory Visit (HOSPITAL_COMMUNITY)
Admission: RE | Admit: 2014-07-06 | Discharge: 2014-07-06 | Disposition: A | Discharge status: Home / Self Care | Payer: Medicaid Other | Source: Ambulatory Visit | Attending: Advanced Practice Midwife | Admitting: Advanced Practice Midwife

## 2014-07-06 DIAGNOSIS — R102 Pelvic and perineal pain: Secondary | ICD-10-CM | POA: Diagnosis present

## 2014-07-06 DIAGNOSIS — N939 Abnormal uterine and vaginal bleeding, unspecified: Secondary | ICD-10-CM | POA: Diagnosis present

## 2014-07-06 DIAGNOSIS — O3680X0 Pregnancy with inconclusive fetal viability, not applicable or unspecified: Secondary | ICD-10-CM | POA: Diagnosis not present

## 2014-07-06 DIAGNOSIS — O26899 Other specified pregnancy related conditions, unspecified trimester: Secondary | ICD-10-CM

## 2014-07-06 DIAGNOSIS — R109 Unspecified abdominal pain: Secondary | ICD-10-CM

## 2014-07-06 DIAGNOSIS — R58 Hemorrhage, not elsewhere classified: Secondary | ICD-10-CM | POA: Insufficient documentation

## 2014-07-06 NOTE — Telephone Encounter (Signed)
Pt brought into MAU office to review U/S report.  Cardiac activity present.  CRL measuring 6 w 4d with EDC of 02/24/14.  Small subchorionic hemorrhage present and reviewed.  All questions answered.  Pregnancy verification letter provided.  Pt to return to [facility] for emergency and [facility].

## 2014-07-10 ENCOUNTER — Encounter (HOSPITAL_COMMUNITY): Payer: Self-pay | Admitting: *Deleted

## 2014-07-10 ENCOUNTER — Inpatient Hospital Stay (HOSPITAL_COMMUNITY)
Admission: AD | Admit: 2014-07-10 | Discharge: 2014-07-10 | Disposition: A | Discharge status: Home / Self Care | Payer: Medicaid Other | Source: Ambulatory Visit | Attending: Obstetrics and Gynecology | Admitting: Obstetrics and Gynecology

## 2014-07-10 ENCOUNTER — Inpatient Hospital Stay (HOSPITAL_COMMUNITY): Payer: Medicaid Other

## 2014-07-10 DIAGNOSIS — O209 Hemorrhage in early pregnancy, unspecified: Secondary | ICD-10-CM

## 2014-07-10 DIAGNOSIS — O4690 Antepartum hemorrhage, unspecified, unspecified trimester: Secondary | ICD-10-CM | POA: Diagnosis present

## 2014-07-10 DIAGNOSIS — Z3A01 Less than 8 weeks gestation of pregnancy: Secondary | ICD-10-CM | POA: Diagnosis not present

## 2014-07-10 HISTORY — DX: Unspecified abnormal cytological findings in specimens from vagina: R87.629

## 2014-07-10 LAB — URINALYSIS, ROUTINE W REFLEX MICROSCOPIC
Bilirubin Urine: NEGATIVE
Glucose, UA: NEGATIVE mg/dL
Ketones, ur: NEGATIVE mg/dL
LEUKOCYTES UA: NEGATIVE
NITRITE: NEGATIVE
PROTEIN: NEGATIVE mg/dL
Specific Gravity, Urine: >1.03 — ABNORMAL HIGH (ref 1.005–1.030)
UROBILINOGEN UA: 0.2 mg/dL (ref 0.0–1.0)
pH: 6 (ref 5.0–8.0)

## 2014-07-10 LAB — URINE MICROSCOPIC-ADD ON

## 2014-07-10 LAB — CBC
HCT: 39.9 % (ref 36.0–46.0)
HEMOGLOBIN: 13.2 g/dL (ref 12.0–15.0)
MCH: 22.9 pg — AB (ref 26.0–34.0)
MCHC: 33.1 g/dL (ref 30.0–36.0)
MCV: 69.2 fL — AB (ref 78.0–100.0)
Platelets: 312 10*3/uL (ref 150–400)
RBC: 5.77 MIL/uL — AB (ref 3.87–5.11)
RDW: 15.1 % (ref 11.5–15.5)
WBC: 9 10*3/uL (ref 4.0–10.5)

## 2014-07-10 LAB — HCG, QUANTITATIVE, PREGNANCY: HCG, BETA CHAIN, QUANT, S: 131254 m[IU]/mL — AB (ref <5)

## 2014-07-10 NOTE — MAU Note (Signed)
PT  SAYS  SHE STARTED CRAMPING   AT 8 PM.   SAYS LOWER BACK STARTED  HURTING AT 8PM-         SAYS  HAS BEEN VOMITING -     HAS PHENERGAN-  TOOK  LAST AT  5PM-               SAYS BLEEDING STARTED  AT 7PM-  PAD - IN TRIAGE-    SMALL AMT LIGHT BROWN  D/C.      LAST SEX-     11-5 . GETS     [facility]-  WITH LAST  BABY  WAS WITH [facility].      HAS NOT CALLED FOR AN APPOINTMENT  YET.

## 2014-07-10 NOTE — MAU Provider Note (Signed)
History     CSN: [id number]  Arrival date and time: 07/10/14 2034   None     No chief complaint on file.  HPI Kara Fernandez 23 y.o. [id number] presents at [redacted]w[redacted]d complaining of vaginal bleeding and abdominal pain that started between 7 and 8pm this evening.  Abdominal pain is described as crampy and sharp and 8/10.  She has not soaked a pad yet but does require one.  She has had nausea and vomiting and is vomiting approximately 10 times per day.  She plans to go to [facility] for prenatal care but is waiting for insurance.  Associated symptoms include back pain, headache.   OB History    Gravida Para Term Preterm AB TAB SAB Ectopic Multiple Living   3 1 1  1 1    1       Past Medical History  Diagnosis Date  . Chlamydia infection     POSITIVE 10/06,8/07,7/09,11/11 ;at age 40  . ASCUS on Pap smear     C&B 10/09 W/LGSIL  . HSV (herpes simplex virus) infection   . 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 Gonorrhea     POSITIVE 8/06, 10/06; at age 67  . GC (gonococcus infection)   . Trichomonal vulvovaginitis   . Ovarian cyst   . UTI (lower urinary tract infection)   . 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 H/O varicella   . History of high blood pressure   . Yeast infection   . H/O bacterial infection     frequently  . Abnormal Pap smear     completed  at age 4  . Asthma     as a child   . Angina ~2000    enlarged heart, pain due to stress  . Ovarian cyst   . H/O multiple allergies   . Vaginal Pap smear, abnormal     Past Surgical History  Procedure Laterality Date  . Dilation and curettage of uterus      abortion  . Cesarean section  12/29/2011    Procedure: CESAREAN SECTION;  Surgeon: Purcell Nails, MD;  Location: [facility];  Service: Gynecology;  Laterality: N/A;    Family History  Problem Relation Age of Onset  . Hypertension Mother   . Hypertension Father   . Hypertension Maternal Grandmother   . Stroke  Maternal Grandmother   . Hypertension Maternal Grandfather   . Hypertension Maternal Aunt   . Hypertension Maternal Uncle   . Alcohol abuse Maternal Uncle   . Alcohol abuse Paternal Uncle     History  Substance Use Topics  . Smoking status: Never Smoker   . Smokeless tobacco: Never Used  . Alcohol Use: 0.6 oz/week    1 Shots of liquor per week     Comment: occasionally    Allergies: No Known Allergies  Prescriptions prior to admission  Medication Sig Dispense Refill Last Dose  . Prenatal Vit-Fe Fumarate-FA (PRENATAL VITAMINS) 28-0.8 MG TABS Take 1 tablet by mouth daily. 30 tablet 5 Past Week at Unknown time  . promethazine (PHENERGAN) 25 MG tablet Take 1 tablet (25 mg total) by mouth every 6 (six) hours as needed for nausea or vomiting. 30 tablet 0 07/10/2014 at Unknown time  . acetaminophen (TYLENOL) 500 MG tablet Take 1,000 mg by mouth every 6 (six) hours as needed for headache.   More than a month at  Unknown time  . valACYclovir (VALTREX) 500 MG tablet Take 500 mg by mouth daily as needed (For outbreak.).    More than a month at Unknown time    Review of Systems  Constitutional: Positive for chills and diaphoresis. Negative for fever.  HENT: Negative for congestion and sore throat.   Respiratory: Positive for shortness of breath. Negative for cough and wheezing.   Cardiovascular: Negative for chest pain and palpitations.  Gastrointestinal: Positive for heartburn, nausea, vomiting, abdominal pain, diarrhea and constipation.  Genitourinary: Negative for dysuria and frequency.  Musculoskeletal: Positive for back pain. Negative for neck pain.  Skin: Negative for itching and rash.  Neurological: Positive for weakness and headaches. Negative for dizziness and tingling.  Psychiatric/Behavioral: Negative for depression, suicidal ideas and substance abuse. The patient is not nervous/anxious.     Physical Exam   Blood pressure 116/66, pulse 69, temperature 97.6 F (36.4 C),  temperature source Oral, resp. rate 16, height 5\' 1"  (1.549 m), weight 138 lb 6 oz (62.766 kg), last menstrual period 05/23/2014.  Physical Exam  Constitutional: She is oriented to person, place, and time. She appears well-developed and well-nourished.  HENT:  Head: Normocephalic and atraumatic.  Eyes: EOM are normal.  Neck: Normal range of motion.  Cardiovascular: Normal rate, regular rhythm and normal heart sounds.   Respiratory: Effort normal and breath sounds normal. No respiratory distress.  GI: Soft. Bowel sounds are normal. She exhibits no distension. There is no tenderness. There is no rebound and no guarding.  Genitourinary:  Small to mod amt of dark red blood in vault.   No CMT, no adnexal pain or tenderness  Musculoskeletal: Normal range of motion.  Neurological: She is alert and oriented to person, place, and time.  Skin: Skin is warm and dry.  Psychiatric: She has a normal mood and affect.   Results for orders placed or performed during the hospital encounter of 07/10/14 (from the past 72 hour(s))  Urinalysis, Routine w reflex microscopic     Status: Abnormal   Collection Time: 07/10/14  9:23 PM  Result Value Ref Range   Color, Urine YELLOW YELLOW   APPearance CLEAR CLEAR   Specific Gravity, Urine >1.030 (H) 1.005 - 1.030   pH 6.0 5.0 - 8.0   Glucose, UA NEGATIVE NEGATIVE mg/dL   Hgb urine dipstick LARGE (A) NEGATIVE   Bilirubin Urine NEGATIVE NEGATIVE   Ketones, ur NEGATIVE NEGATIVE mg/dL   Protein, ur NEGATIVE NEGATIVE mg/dL   Urobilinogen, UA 0.2 0.0 - 1.0 mg/dL   Nitrite NEGATIVE NEGATIVE   Leukocytes, UA NEGATIVE NEGATIVE  Urine microscopic-add on     Status: Abnormal   Collection Time: 07/10/14  9:23 PM  Result Value Ref Range   Squamous Epithelial / LPF RARE RARE   WBC, UA 0-2 <3 WBC/hpf   RBC / HPF 7-10 <3 RBC/hpf   Bacteria, UA FEW (A) RARE  CBC     Status: Abnormal   Collection Time: 07/10/14  9:30 PM  Result Value Ref Range   WBC 9.0 4.0 - 10.5  K/uL   RBC 5.77 (H) 3.87 - 5.11 MIL/uL   Hemoglobin 13.2 12.0 - 15.0 g/dL   HCT 16.139.9 09.636.0 - 04.546.0 %   MCV 69.2 (L) 78.0 - 100.0 fL   MCH 22.9 (L) 26.0 - 34.0 pg   MCHC 33.1 30.0 - 36.0 g/dL   RDW 40.915.1 81.111.5 - 91.415.5 %   Platelets 312 150 - 400 K/uL  hCG, quantitative, pregnancy  Status: Abnormal   Collection Time: 07/10/14  9:30 PM  Result Value Ref Range   hCG, Beta Chain, Quant, S 131254 (H) <5 mIU/mL    Comment:          GEST. AGE      CONC.  (mIU/mL)   <=1 WEEK        5 - 50     2 WEEKS       50 - 500     3 WEEKS       100 - 10,000     4 WEEKS     1,000 - 30,000     5 WEEKS     3,500 - 115,000   6-8 WEEKS     12,000 - 270,000    12 WEEKS     15,000 - 220,000        FEMALE AND NON-PREGNANT FEMALE:     LESS THAN 5 mIU/mL     MAU Course  Procedures  MDM Discussed with Dr. Jolayne Pantheronstant who advises for U/S. Per prelim u/s report, cardiac activity present at 144.  Subchorionic hemorrhage not seen.  CRL 4939w3d Assessment and Plan   A:  1. Vaginal bleeding in pregnancy, first trimester     P: Discharge to home  [facility]  Increase po fluids  Nausea medication to take during pregnancy:   Unisom (doxylamine succinate 25 mg tablets) Take one tablet daily at bedtime. If symptoms are not adequately controlled, the dose can be increased to a maximum recommended dose of two tablets daily (1/2 tablet in the morning, 1/2 tablet mid-afternoon and one at bedtime).  Vitamin B6 100mg  tablets. Take one tablet twice a day (up to 200 mg per day).   Patient may return to MAU as needed or if her condition were to change or worsen    Bertram Denvereague Clark, Karen E 07/10/2014, 10:21 PM

## 2014-07-10 NOTE — Discharge Instructions (Signed)
Increase fluid consumption  Nausea medication to take during pregnancy:   Unisom (doxylamine succinate 25 mg tablets) Take one tablet daily at bedtime. If symptoms are not adequately controlled, the dose can be increased to a maximum recommended dose of two tablets daily (1/2 tablet in the morning, 1/2 tablet mid-afternoon and one at bedtime).  Vitamin B6 100mg  tablets. Take one tablet twice a day (up to 200 mg per day).    Pelvic Rest Pelvic rest is sometimes recommended for women when:   The placenta is partially or completely covering the opening of the cervix (placenta previa).  There is bleeding between the uterine wall and the amniotic sac in the first trimester (subchorionic hemorrhage).  The cervix begins to open without labor starting (incompetent cervix, cervical insufficiency).  The labor is too early (preterm labor). HOME CARE INSTRUCTIONS  Do not have sexual intercourse, stimulation, or an orgasm.  Do not use tampons, douche, or put anything in the vagina.  Do not lift anything over 10 pounds (4.5 kg).  Avoid strenuous activity or straining your pelvic muscles. SEEK MEDICAL CARE IF:  You have any vaginal bleeding during pregnancy. Treat this as a potential emergency.  You have cramping pain felt low in the stomach (stronger than menstrual cramps).  You notice vaginal discharge (watery, mucus, or bloody).  You have a low, dull backache.  There are regular contractions or uterine tightening. SEEK IMMEDIATE MEDICAL CARE IF: You have vaginal bleeding and have placenta previa.  Document Released: 12/06/2010 Document Revised: 11/03/2011 Document Reviewed: 12/06/2010 [facility] Patient Information 2015 [employer], [place]. This information is not intended to replace advice given to you by your health care provider. Make sure you discuss any questions you have with your health care provider.  Vaginal Bleeding During Pregnancy, First Trimester A small amount of bleeding  (spotting) from the vagina is common in early pregnancy. Sometimes the bleeding is normal and is not a problem, and sometimes it is a sign of something serious. Be sure to tell your doctor about any bleeding from your vagina right away. HOME CARE  Watch your condition for any changes.  Follow your doctor's instructions about how active you can be.  If you are on bed rest:  You may need to stay in bed and only get up to use the bathroom.  You may be allowed to do some activities.  If you need help, make plans for someone to help you.  Write down:  The number of pads you use each day.  How often you change pads.  How soaked (saturated) your pads are.  Do not use tampons.  Do not douche.  Do not have sex or orgasms until your doctor says it is okay.  If you pass any tissue from your vagina, save the tissue so you can show it to your doctor.  Only take medicines as told by your doctor.  Do not take aspirin because it can make you bleed.  Keep all follow-up visits as told by your doctor. GET HELP IF:   You bleed from your vagina.  You have cramps.  You have labor pains.  You have a fever that does not go away after you take medicine. GET HELP RIGHT AWAY IF:   You have very bad cramps in your back or belly (abdomen).  You pass large clots or tissue from your vagina.  You bleed more.  You feel light-headed or weak.  You pass out (faint).  You have chills.  You are leaking  fluid or have a gush of fluid from your vagina.  You pass out while pooping (having a bowel movement). MAKE SURE YOU:  Understand these instructions.  Will watch your condition.  Will get help right away if you are not doing well or get worse. Document Released: 12/26/2013 Document Reviewed: 04/18/2013 [facility] Patient Information 2015 [place]Care, [place]. This information is not intended to replace advice given to you by your health care provider. Make sure you discuss any questions you  have with your health care provider.

## 2014-08-01 ENCOUNTER — Inpatient Hospital Stay (HOSPITAL_COMMUNITY)
Admission: AD | Admit: 2014-08-01 | Discharge: 2014-08-02 | Disposition: A | Discharge status: Home / Self Care | Payer: Medicaid Other | Source: Ambulatory Visit | Attending: Obstetrics and Gynecology | Admitting: Obstetrics and Gynecology

## 2014-08-01 ENCOUNTER — Encounter (HOSPITAL_COMMUNITY): Payer: Self-pay | Admitting: *Deleted

## 2014-08-01 DIAGNOSIS — O210 Mild hyperemesis gravidarum: Secondary | ICD-10-CM | POA: Insufficient documentation

## 2014-08-01 DIAGNOSIS — O219 Vomiting of pregnancy, unspecified: Secondary | ICD-10-CM

## 2014-08-01 DIAGNOSIS — Z3A10 10 weeks gestation of pregnancy: Secondary | ICD-10-CM | POA: Insufficient documentation

## 2014-08-01 LAB — URINALYSIS, ROUTINE W REFLEX MICROSCOPIC
GLUCOSE, UA: NEGATIVE mg/dL
Hgb urine dipstick: NEGATIVE
Ketones, ur: >80 mg/dL — AB
Leukocytes, UA: NEGATIVE
Nitrite: NEGATIVE
PH: 6 (ref 5.0–8.0)
Protein, ur: NEGATIVE mg/dL
Specific Gravity, Urine: >1.03 — ABNORMAL HIGH (ref 1.005–1.030)
Urobilinogen, UA: 0.2 mg/dL (ref 0.0–1.0)

## 2014-08-01 MED ORDER — METOCLOPRAMIDE HCL 5 MG/ML IJ SOLN
10.0000 mg | Freq: Once | INTRAMUSCULAR | Status: AC
  Administered 2014-08-01: 10 mg via INTRAVENOUS
  Filled 2014-08-01: qty 2

## 2014-08-01 MED ORDER — DEXTROSE 5 % IN LACTATED RINGERS IV BOLUS
1000.0000 mL | Freq: Once | INTRAVENOUS | Status: AC
  Administered 2014-08-01: 1000 mL via INTRAVENOUS

## 2014-08-01 NOTE — MAU Provider Note (Signed)
History     CSN: [id number]  Arrival date and time: 08/01/14 2205   First Provider Initiated Contact with Patient 08/01/14 2310      Chief Complaint  Patient presents with  . Generalized Body Aches  . Emesis During Pregnancy   HPI  Kara Fernandez is a 23 y.o. G3P1011 at 1676w1d who presents to MAU today with complaint of N/V. The patient states that she has had N/V throughout the pregnancy. She states it was improving a few weeks ago, but now is back again daily. She was taking Phenergan and also tried Unisom/B6 without relief. She states that her whole body hurts from vomiting. She also has a headache. She states decreased urine production and no BM in 4 days. She also endorses minimal PO intake. She denies heartburn or vaginal bleeding.   OB History    Gravida Para Term Preterm AB TAB SAB Ectopic Multiple Living   3 1 1  1 1    1       Past Medical History  Diagnosis Date  . Chlamydia infection     POSITIVE 10/06,8/07,7/09,11/11 ;at age 23  . ASCUS on Pap smear     C&B 10/09 W/LGSIL  . HSV (herpes simplex virus) infection   . 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 Gonorrhea     POSITIVE 8/06, 10/06; at age 89  . GC (gonococcus infection)   . Trichomonal vulvovaginitis   . Ovarian cyst   . UTI (lower urinary tract infection)   . 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 H/O varicella   . History of high blood pressure   . Yeast infection   . H/O bacterial infection     frequently  . Abnormal Pap smear     completed  at age 23  . Asthma     as a child   . Angina ~2000    enlarged heart, pain due to stress  . Ovarian cyst   . H/O multiple allergies   . Vaginal Pap smear, abnormal     Past Surgical History  Procedure Laterality Date  . Dilation and curettage of uterus      abortion  . Cesarean section  12/29/2011    Procedure: CESAREAN SECTION;  Surgeon: Purcell NailsAngela Y Roberts, MD;  Location: [facility];  Service:  Gynecology;  Laterality: N/A;    Family History  Problem Relation Age of Onset  . Hypertension Mother   . Hypertension Father   . Hypertension Maternal Grandmother   . Stroke Maternal Grandmother   . Hypertension Maternal Grandfather   . Hypertension Maternal Aunt   . Hypertension Maternal Uncle   . Alcohol abuse Maternal Uncle   . Alcohol abuse Paternal Uncle     History  Substance Use Topics  . Smoking status: Never Smoker   . Smokeless tobacco: Never Used  . Alcohol Use: 0.6 oz/week    1 Shots of liquor per week     Comment: occasionally    Allergies: No Known Allergies  Prescriptions prior to admission  Medication Sig Dispense Refill Last Dose  . acetaminophen (TYLENOL) 500 MG tablet Take 1,000 mg by mouth every 6 (six) hours as needed for headache.   More than a month at Unknown time  . Prenatal Vit-Fe Fumarate-FA (PRENATAL VITAMINS) 28-0.8 MG TABS Take 1 tablet by mouth daily. 30 tablet 5 Past Week at Unknown time  .  valACYclovir (VALTREX) 500 MG tablet Take 500 mg by mouth daily as needed (For outbreak.).    Unknown at Unknown time    Review of Systems  Constitutional: Negative for fever and malaise/fatigue.  Gastrointestinal: Positive for nausea, vomiting and abdominal pain. Negative for diarrhea and constipation.  Genitourinary: Negative for dysuria, urgency and frequency.       Neg - vaginal bleeding   Physical Exam   Blood pressure 117/73, pulse 63, temperature 100 F (37.8 C), temperature source Oral, resp. rate 16, height 5\' 1"  (1.549 m), weight 127 lb (57.607 kg), last menstrual period 05/23/2014, SpO2 100 %.  Physical Exam  Constitutional: She is oriented to person, place, and time. She appears well-developed and well-nourished. No distress.  HENT:  Head: Normocephalic.  Cardiovascular: Normal rate.   Respiratory: Effort normal.  GI: Soft. Bowel sounds are normal. She exhibits no distension and no mass. There is no tenderness. There is no rebound and  no guarding.  Neurological: She is alert and oriented to person, place, and time.  Skin: Skin is warm and dry. No erythema.  Psychiatric: She has a normal mood and affect.   Results for orders placed or performed during the hospital encounter of 08/01/14 (from the past 24 hour(s))  Urinalysis, Routine w reflex microscopic     Status: Abnormal   Collection Time: 08/01/14 10:30 PM  Result Value Ref Range   Color, Urine YELLOW YELLOW   APPearance CLEAR CLEAR   Specific Gravity, Urine >1.030 (H) 1.005 - 1.030   pH 6.0 5.0 - 8.0   Glucose, UA NEGATIVE NEGATIVE mg/dL   Hgb urine dipstick NEGATIVE NEGATIVE   Bilirubin Urine SMALL (A) NEGATIVE   Ketones, ur >80 (A) NEGATIVE mg/dL   Protein, ur NEGATIVE NEGATIVE mg/dL   Urobilinogen, UA 0.2 0.0 - 1.0 mg/dL   Nitrite NEGATIVE NEGATIVE   Leukocytes, UA NEGATIVE NEGATIVE     MAU Course  Procedures None  MDM FHR - 164 bpm with doppler UA today shows signs of dehydration 1 liter IV D5LR with 10 mg Reglan given in MAU Patient states that she feels cold and jittery after Reglan administration. Offered Benadryl, but patient refused because she is driving.  Reaction resolved without intervention. Patient is now resting comfortably.  Assessment and Plan  A: SIUP at 4593w1d Nausea and vomiting in pregnancy prior to [redacted] weeks gestation  P: Discharge home Rx for Zofran sent to patient's pharmacy Risks of Zofran discussed with patient. She agrees and would like to trial Zofran AVS contains information about N/V of pregnancy Patient advised to start prenatal care with provider of choice ASAP Patient may return to MAU as needed or if her condition were to change or worsen    Marny LowensteinJulie N Wenzel, PA-C  08/01/2014, 11:10 PM

## 2014-08-01 NOTE — MAU Note (Signed)
Pt reports body aches x 3 days, continued vomiting and has noticed blood in her emesis. Last BM was 4 days ago and states she is not urinating very much.

## 2014-08-01 NOTE — MAU Note (Signed)
STates sometimes gets SOB and feels like my heart is beating rapidly. Have hx of angina but has never seen cardiologist

## 2014-08-02 DIAGNOSIS — O219 Vomiting of pregnancy, unspecified: Secondary | ICD-10-CM

## 2014-08-02 MED ORDER — ONDANSETRON HCL 4 MG PO TABS: 4.0000 mg | ORAL_TABLET | Freq: Four times a day (QID) | ORAL | Status: DC

## 2014-08-02 NOTE — Discharge Instructions (Signed)
Morning Sickness °Morning sickness is when you feel sick to your stomach (nauseous) during pregnancy. You may feel sick to your stomach and throw up (vomit). You may feel sick in the morning, but you can feel this way any time of day. Some women feel very sick to their stomach and cannot stop throwing up (hyperemesis gravidarum). °HOME CARE °· Only take medicines as told by your doctor. °· Take multivitamins as told by your doctor. Taking multivitamins before getting pregnant can stop or lessen the harshness of morning sickness. °· Eat dry toast or unsalted crackers before getting out of bed. °· Eat 5 to 6 small meals a day. °· Eat dry and bland foods like rice and baked potatoes. °· Do not drink liquids with meals. Drink between meals. °· Do not eat greasy, fatty, or spicy foods. °· Have someone cook for you if the smell of food causes you to feel sick or throw up. °· If you feel sick to your stomach after taking prenatal vitamins, take them at night or with a snack. °· Eat protein when you need a snack (nuts, yogurt, cheese). °· Eat unsweetened gelatins for dessert. °· Wear a bracelet used for sea sickness (acupressure wristband). °· Go to a doctor that puts thin needles into certain body points (acupuncture) to improve how you feel. °· Do not smoke. °· Use a humidifier to keep the air in your house free of odors. °· Get lots of fresh air. °GET HELP IF: °· You need medicine to feel better. °· You feel dizzy or lightheaded. °· You are losing weight. °GET HELP RIGHT AWAY IF:  °· You feel very sick to your stomach and cannot stop throwing up. °· You pass out (faint). °MAKE SURE YOU: °· Understand these instructions. °· Will watch your condition. °· Will get help right away if you are not doing well or get worse. °Document Released: 09/18/2004 Document Revised: 08/16/2013 Document Reviewed: 01/26/2013 °Exit[employer]® Patient Information ©2015 [employer]. This information is not intended to replace advice given to you by  your health care provider. Make sure you discuss any questions you have with your health care provider. ° °Eating Plan for Hyperemesis Gravidarum °Severe cases of hyperemesis gravidarum can lead to dehydration and malnutrition. The hyperemesis eating plan is one way to lessen the symptoms of nausea and vomiting. It is often used with prescribed medicines to control your symptoms.  °WHAT CAN I DO TO RELIEVE MY SYMPTOMS? °Listen to your body. Everyone is different and has different preferences. Find what works best for you. Some of the following things may help: °· Eat and drink slowly. °· Eat 5-6 small meals daily instead of 3 large meals.   °· Eat crackers before you get out of bed in the morning.   °· Starchy foods are usually well tolerated (such as cereal, toast, bread, potatoes, pasta, rice, and pretzels).   °· Ginger may help with nausea. Add ¼ tsp ground ginger to hot tea or choose ginger tea.   °· Try drinking 100% fruit juice or an electrolyte drink. °· Continue to take your prenatal vitamins as directed by your health care provider. If you are having trouble taking your prenatal vitamins, talk with your health care provider about different options. °· Include at least 1 serving of protein with your meals and snacks (such as meats or poultry, beans, nuts, eggs, or yogurt). Try eating a protein-rich snack before bed (such as cheese and crackers or a half turkey or peanut butter sandwich). °WHAT THINGS SHOULD I   AVOID TO REDUCE MY SYMPTOMS? °The following things may help reduce your symptoms: °· Avoid foods with strong smells. Try eating meals in well-ventilated areas that are free of odors. °· Avoid drinking water or other beverages with meals. Try not to drink anything less than 30 minutes before and after meals. °· Avoid drinking more than 1 cup of fluid at a time. °· Avoid fried or high-fat foods, such as butter and cream sauces. °· Avoid spicy foods. °· Avoid skipping meals the best you can. Nausea can be  more intense on an empty stomach. If you cannot tolerate food at that time, do not force it. Try sucking on ice chips or other frozen items and make up the calories later. °· Avoid lying down within 2 hours after eating. °Document Released: 06/08/2007 Document Revised: 08/16/2013 Document Reviewed: 06/15/2013 °ExitCare® Patient Information ©2015 [employer]. This information is not intended to replace advice given to you by your health care provider. Make sure you discuss any questions you have with your health care provider. ° °

## 2014-08-07 LAB — OB RESULTS CONSOLE ABO/RH: RH Type: POSITIVE

## 2014-08-07 LAB — OB RESULTS CONSOLE HIV ANTIBODY (ROUTINE TESTING): HIV: NONREACTIVE

## 2014-08-07 LAB — OB RESULTS CONSOLE RPR: RPR: NONREACTIVE

## 2014-08-07 LAB — OB RESULTS CONSOLE HEPATITIS B SURFACE ANTIGEN: Hepatitis B Surface Ag: NEGATIVE

## 2014-08-07 LAB — OB RESULTS CONSOLE ANTIBODY SCREEN: Antibody Screen: NEGATIVE

## 2014-08-07 LAB — OB RESULTS CONSOLE RUBELLA ANTIBODY, IGM: Rubella: IMMUNE

## 2014-08-29 ENCOUNTER — Emergency Department (HOSPITAL_COMMUNITY)
Admission: EM | Admit: 2014-08-29 | Discharge: 2014-08-29 | Disposition: A | Discharge status: Home / Self Care | Payer: Medicaid Other | Attending: Emergency Medicine | Admitting: Emergency Medicine

## 2014-08-29 ENCOUNTER — Encounter (HOSPITAL_COMMUNITY): Payer: Self-pay | Admitting: Emergency Medicine

## 2014-08-29 DIAGNOSIS — S3992XA Unspecified injury of lower back, initial encounter: Secondary | ICD-10-CM | POA: Insufficient documentation

## 2014-08-29 DIAGNOSIS — W01198A Fall on same level from slipping, tripping and stumbling with subsequent striking against other object, initial encounter: Secondary | ICD-10-CM | POA: Insufficient documentation

## 2014-08-29 DIAGNOSIS — Z79899 Other long term (current) drug therapy: Secondary | ICD-10-CM | POA: Insufficient documentation

## 2014-08-29 DIAGNOSIS — W19XXXA Unspecified fall, initial encounter: Secondary | ICD-10-CM

## 2014-08-29 DIAGNOSIS — Z8619 Personal history of other infectious and parasitic diseases: Secondary | ICD-10-CM | POA: Insufficient documentation

## 2014-08-29 DIAGNOSIS — Z8679 Personal history of other diseases of the circulatory system: Secondary | ICD-10-CM | POA: Diagnosis not present

## 2014-08-29 DIAGNOSIS — Y93E1 Activity, personal bathing and showering: Secondary | ICD-10-CM | POA: Diagnosis not present

## 2014-08-29 DIAGNOSIS — Z87440 Personal history of urinary (tract) infections: Secondary | ICD-10-CM | POA: Insufficient documentation

## 2014-08-29 DIAGNOSIS — J45909 Unspecified asthma, uncomplicated: Secondary | ICD-10-CM | POA: Insufficient documentation

## 2014-08-29 DIAGNOSIS — Y9289 Other specified places as the place of occurrence of the external cause: Secondary | ICD-10-CM | POA: Insufficient documentation

## 2014-08-29 DIAGNOSIS — Z3A14 14 weeks gestation of pregnancy: Secondary | ICD-10-CM | POA: Diagnosis not present

## 2014-08-29 DIAGNOSIS — O9A212 Injury, poisoning and certain other consequences of external causes complicating pregnancy, second trimester: Secondary | ICD-10-CM | POA: Diagnosis not present

## 2014-08-29 DIAGNOSIS — Z8742 Personal history of other diseases of the female genital tract: Secondary | ICD-10-CM | POA: Insufficient documentation

## 2014-08-29 DIAGNOSIS — M545 Low back pain, unspecified: Secondary | ICD-10-CM

## 2014-08-29 DIAGNOSIS — Y998 Other external cause status: Secondary | ICD-10-CM | POA: Diagnosis not present

## 2014-08-29 DIAGNOSIS — Z3490 Encounter for supervision of normal pregnancy, unspecified, unspecified trimester: Secondary | ICD-10-CM

## 2014-08-29 MED ORDER — ACETAMINOPHEN 325 MG PO TABS
650.0000 mg | ORAL_TABLET | Freq: Once | ORAL | Status: AC
  Administered 2014-08-29: 650 mg via ORAL
  Filled 2014-08-29: qty 2

## 2014-08-29 NOTE — Discharge Instructions (Signed)
Please follow the directions provided. Be sure to follow-up with your OB/GYN to ensure you're getting better. Take Tylenol every 4 hours for pain. Don't hesitate to return for any new, worsening, or concerning symptoms.   WHEN SHOULD I SEEK IMMEDIATE MEDICAL CARE?  You fall on your abdomen or experience any high-force accident or injury.  You have been assaulted (domestic or otherwise).  You have been in a car accident.  You develop vaginal bleeding.  You develop fluid leaking from the vagina.  You develop uterine contractions (pelvic cramping, pain, or significant low back pain).  You become weak or faint, or have uncontrolled vomiting after trauma.  You had a serious burn. This includes burns to the face, neck, hands, or genitals, or burns greater than the size of your palm anywhere else.  You develop neck stiffness or pain after a fall or from other trauma.  You develop a headache or vision problems after a fall or from other trauma.  You do not feel the baby moving or the baby is not moving as much as before a fall or other trauma.

## 2014-08-29 NOTE — ED Notes (Addendum)
Pt is a [redacted] weeks pregnant. Pt fell this morning getting out of shower fell forward landing on stomach. Pt c/o lower bad pain and back pain. No bleeding per pt

## 2014-08-29 NOTE — ED Provider Notes (Signed)
CSN: [id number]     Arrival date & time 08/29/14  1101 History   First MD Initiated Contact with Patient 08/29/14 1137     Chief Complaint  Patient presents with  . Fall   (Consider location/radiation/quality/duration/timing/severity/associated sxs/prior Treatment) HPI Kara Fernandez is a 24 yo female presenting with low back pain after falling in her shower.  She reports late last night she was exiting the shower and slipped falling straight down onto her right leg back. She reports her left leg was extended and her right lef was folded underneath her but the majority of her weight was on her back and buttocks. She denies striking her abdomen directly.  Since the fall she has had pain and tenderness in her bilat lower back. She denies any abdominal cramping or vaginal bleeding. She denies any alteration in sensation to either leg, weakness or loss of bowel or bladder.  Past Medical History  Diagnosis Date  . Chlamydia infection     POSITIVE 10/06,8/07,7/09,11/11 ;at age 24  . ASCUS on Pap smear     C&B 10/09 W/LGSIL  . HSV (herpes simplex virus) infection   . 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 Gonorrhea     POSITIVE 8/06, 10/06; at age 24  . GC (gonococcus infection)   . Trichomonal vulvovaginitis   . Ovarian cyst   . UTI (lower urinary tract infection)   . 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 H/O varicella   . History of high blood pressure   . Yeast infection   . H/O bacterial infection     frequently  . Abnormal Pap smear     completed  at age 24  . Asthma     as a child   . Angina ~2000    enlarged heart, pain due to stress  . Ovarian cyst   . H/O multiple allergies   . Vaginal Pap smear, abnormal    Past Surgical History  Procedure Laterality Date  . Dilation and curettage of uterus      abortion  . Cesarean section  12/29/2011    Procedure: CESAREAN SECTION;  Surgeon: Purcell NailsAngela Y Roberts, MD;  Location: [facility];  Service: Gynecology;  Laterality: N/A;   Family History  Problem Relation Age of Onset  . Hypertension Mother   . Hypertension Father   . Hypertension Maternal Grandmother   . Stroke Maternal Grandmother   . Hypertension Maternal Grandfather   . Hypertension Maternal Aunt   . Hypertension Maternal Uncle   . Alcohol abuse Maternal Uncle   . Alcohol abuse Paternal Uncle    History  Substance Use Topics  . Smoking status: Never Smoker   . Smokeless tobacco: Never Used  . Alcohol Use: 0.6 oz/week    1 Shots of liquor per week     Comment: occasionally   OB History    Gravida Para Term Preterm AB TAB SAB Ectopic Multiple Living   3 1 1  1 1    1      Review of Systems  Constitutional: Negative for fever and chills.  HENT: Negative for sore throat.   Eyes: Negative for visual disturbance.  Respiratory: Negative for cough and shortness of breath.   Cardiovascular: Negative for chest pain and leg swelling.  Gastrointestinal: Negative for nausea, vomiting and diarrhea.  Genitourinary: Negative for dysuria.  Musculoskeletal: Positive for myalgias, back pain  and arthralgias.  Skin: Negative for rash.  Neurological: Negative for weakness, numbness and headaches.    Allergies  Review of patient's allergies indicates no known allergies.  Home Medications   Prior to Admission medications   Medication Sig Start Date End Date Taking? Authorizing Provider  acetaminophen (TYLENOL) 500 MG tablet Take 500 mg by mouth every 6 (six) hours as needed for headache.    Yes Historical Provider, MD  Prenatal Vit-Fe Fumarate-FA (PRENATAL VITAMINS) 28-0.8 MG TABS Take 1 tablet by mouth daily. 06/18/14  Yes Lisa A Leftwich-Kirby, CNM  valACYclovir (VALTREX) 500 MG tablet Take 500 mg by mouth daily as needed (For outbreak.).    Yes Historical Provider, MD  ondansetron (ZOFRAN) 4 MG tablet Take 1 tablet (4 mg total) by mouth every 6 (six) hours. Patient not taking: Reported on 08/29/2014 08/02/14    Marny Lowenstein, PA-C   BP 103/57 mmHg  Pulse 79  Temp(Src) 98.6 F (37 C) (Oral)  Resp 20  SpO2 100%  LMP 05/23/2014 Physical Exam  Constitutional: She is oriented to person, place, and time. She appears well-developed and well-nourished. No distress.  HENT:  Head: Normocephalic and atraumatic.  Mouth/Throat: Oropharynx is clear and moist. No oropharyngeal exudate.  Eyes: Conjunctivae are normal.  Neck: Neck supple. No thyromegaly present.  Cardiovascular: Normal rate, regular rhythm and intact distal pulses.   Pulmonary/Chest: Effort normal and breath sounds normal. No respiratory distress. She has no wheezes. She has no rales. She exhibits no tenderness.  Abdominal: Soft. There is no tenderness.  Genitourinary:  Fetal heart rate documented at 150 bpm with hand held doppler.  Musculoskeletal:       Lumbar back: She exhibits tenderness. She exhibits no bony tenderness.       Back:  Lymphadenopathy:    She has no cervical adenopathy.  Neurological: She is alert and oriented to person, place, and time. She has normal strength. No cranial nerve deficit or sensory deficit. GCS eye subscore is 4. GCS verbal subscore is 5. GCS motor subscore is 6.  Skin: Skin is warm and dry. No rash noted. She is not diaphoretic.  Psychiatric: She has a normal mood and affect.  Nursing note and vitals reviewed.   ED Course  Procedures (including critical care time) Labs Review Labs Reviewed - No data to display  Imaging Review No results found.   EKG Interpretation None      MDM   Final diagnoses:  Fall, initial encounter  Bilateral low back pain without sciatica  Pregnancy   24 yo with bilat msk low back pain after slipping in shower.  Pt is reportedly [redacted] weeks pregnant.  She has no bony tenderness and denies any abd cramping or vag bleeding.  She has normal muscle strength and sensation distally and fetal heart tones obtained with fetal doppler.  Discussed with pt only able to treat  pain with tylenol. Pt is well-appearing, in no acute distress and vital signs are stable.  They appear safe to be discharged.  Discharge include follow-up with their OB.  Return precautions provided. Pt aware of plan and in agreement.   Filed Vitals:   08/29/14 1117 08/29/14 1354  BP: 103/57 110/66  Pulse: 79 77  Temp: 98.6 F (37 C)   TempSrc: Oral   Resp: 20 19  SpO2: 100% 100%   Meds given in ED:  Medications  acetaminophen (TYLENOL) tablet 650 mg (not administered)    New Prescriptions   No medications on file  Harle Battiest, NP 09/01/14 0139  Juliet Rude. Rubin Payor, MD 09/01/14 2358

## 2015-02-26 ENCOUNTER — Encounter (HOSPITAL_COMMUNITY): Payer: Self-pay | Admitting: *Deleted

## 2015-02-26 ENCOUNTER — Encounter (HOSPITAL_COMMUNITY)
Admission: AD | Disposition: A | Discharge status: Home / Self Care | Payer: Self-pay | Source: Ambulatory Visit | Attending: Obstetrics and Gynecology

## 2015-02-26 ENCOUNTER — Inpatient Hospital Stay (HOSPITAL_COMMUNITY): Payer: Medicaid Other | Admitting: Anesthesiology

## 2015-02-26 ENCOUNTER — Inpatient Hospital Stay (HOSPITAL_COMMUNITY)
Admission: AD | Admit: 2015-02-26 | Discharge: 2015-03-01 | DRG: 766 | Disposition: A | Discharge status: Home / Self Care | Payer: Medicaid Other | Source: Ambulatory Visit | Attending: Obstetrics and Gynecology | Admitting: Obstetrics and Gynecology

## 2015-02-26 DIAGNOSIS — Z3483 Encounter for supervision of other normal pregnancy, third trimester: Secondary | ICD-10-CM | POA: Diagnosis present

## 2015-02-26 DIAGNOSIS — Z3A40 40 weeks gestation of pregnancy: Secondary | ICD-10-CM | POA: Diagnosis present

## 2015-02-26 DIAGNOSIS — Z98891 History of uterine scar from previous surgery: Secondary | ICD-10-CM

## 2015-02-26 DIAGNOSIS — O3421 Maternal care for scar from previous cesarean delivery: Secondary | ICD-10-CM | POA: Diagnosis present

## 2015-02-26 DIAGNOSIS — M79604 Pain in right leg: Secondary | ICD-10-CM

## 2015-02-26 DIAGNOSIS — O480 Post-term pregnancy: Secondary | ICD-10-CM | POA: Diagnosis present

## 2015-02-26 DIAGNOSIS — O9902 Anemia complicating childbirth: Secondary | ICD-10-CM | POA: Diagnosis present

## 2015-02-26 DIAGNOSIS — Z6830 Body mass index (BMI) 30.0-30.9, adult: Secondary | ICD-10-CM

## 2015-02-26 HISTORY — DX: Essential (primary) hypertension: I10

## 2015-02-26 LAB — HIV ANTIBODY (ROUTINE TESTING W REFLEX): HIV Screen 4th Generation wRfx: NONREACTIVE

## 2015-02-26 LAB — TYPE AND SCREEN
ABO/RH(D): B POS
Antibody Screen: NEGATIVE

## 2015-02-26 LAB — CBC
HCT: 34.6 % — ABNORMAL LOW (ref 36.0–46.0)
Hemoglobin: 11.3 g/dL — ABNORMAL LOW (ref 12.0–15.0)
MCH: 20.9 pg — ABNORMAL LOW (ref 26.0–34.0)
MCHC: 32.7 g/dL (ref 30.0–36.0)
MCV: 64.1 fL — AB (ref 78.0–100.0)
PLATELETS: 284 10*3/uL (ref 150–400)
RBC: 5.4 MIL/uL — AB (ref 3.87–5.11)
RDW: 19 % — ABNORMAL HIGH (ref 11.5–15.5)
WBC: 10.3 10*3/uL (ref 4.0–10.5)

## 2015-02-26 LAB — RPR: RPR: NONREACTIVE

## 2015-02-26 SURGERY — Surgical Case
Anesthesia: Epidural

## 2015-02-26 MED ORDER — ACETAMINOPHEN 325 MG PO TABS: 650.0000 mg | ORAL_TABLET | ORAL | Status: DC | PRN

## 2015-02-26 MED ORDER — DIPHENHYDRAMINE HCL 50 MG/ML IJ SOLN: 12.5000 mg | INTRAMUSCULAR | Status: DC | PRN

## 2015-02-26 MED ORDER — MEPERIDINE HCL 25 MG/ML IJ SOLN
INTRAMUSCULAR | Status: DC | PRN
  Administered 2015-02-26 (×2): 12.5 mg via INTRAVENOUS

## 2015-02-26 MED ORDER — CEFAZOLIN SODIUM-DEXTROSE 2-3 GM-% IV SOLR
INTRAVENOUS | Status: DC | PRN
  Administered 2015-02-26: 2 g via INTRAVENOUS

## 2015-02-26 MED ORDER — PRENATAL MULTIVITAMIN CH
1.0000 | ORAL_TABLET | Freq: Every day | ORAL | Status: DC
Start: 2015-02-27 — End: 2015-03-01
  Administered 2015-02-27 – 2015-03-01 (×3): 1 via ORAL
  Filled 2015-02-26 (×3): qty 1

## 2015-02-26 MED ORDER — ONDANSETRON HCL 4 MG/2ML IJ SOLN
4.0000 mg | Freq: Four times a day (QID) | INTRAMUSCULAR | Status: DC | PRN
  Administered 2015-02-26: 4 mg via INTRAVENOUS
  Filled 2015-02-26: qty 2

## 2015-02-26 MED ORDER — MORPHINE SULFATE 0.5 MG/ML IJ SOLN
INTRAMUSCULAR | Status: AC
  Filled 2015-02-26: qty 10

## 2015-02-26 MED ORDER — NALBUPHINE HCL 10 MG/ML IJ SOLN: 5.0000 mg | INTRAMUSCULAR | Status: DC | PRN

## 2015-02-26 MED ORDER — OXYTOCIN 10 UNIT/ML IJ SOLN
INTRAMUSCULAR | Status: AC
Start: 2015-02-26 — End: 2015-02-26
  Filled 2015-02-26: qty 4

## 2015-02-26 MED ORDER — DEXAMETHASONE SODIUM PHOSPHATE 4 MG/ML IJ SOLN
INTRAMUSCULAR | Status: AC
  Filled 2015-02-26: qty 1

## 2015-02-26 MED ORDER — CITRIC ACID-SODIUM CITRATE 334-500 MG/5ML PO SOLN
30.0000 mL | ORAL | Status: DC | PRN
  Administered 2015-02-26: 30 mL via ORAL
  Filled 2015-02-26: qty 15

## 2015-02-26 MED ORDER — EPHEDRINE SULFATE 50 MG/ML IJ SOLN: INTRAMUSCULAR | Status: DC | PRN

## 2015-02-26 MED ORDER — OXYCODONE-ACETAMINOPHEN 5-325 MG PO TABS: 2.0000 | ORAL_TABLET | ORAL | Status: DC | PRN

## 2015-02-26 MED ORDER — OXYCODONE-ACETAMINOPHEN 5-325 MG PO TABS
2.0000 | ORAL_TABLET | ORAL | Status: DC | PRN
  Administered 2015-02-27 – 2015-03-01 (×10): 2 via ORAL
  Filled 2015-02-26 (×10): qty 2

## 2015-02-26 MED ORDER — LACTATED RINGERS IV SOLN
INTRAVENOUS | Status: DC | PRN
  Administered 2015-02-26 (×3): via INTRAVENOUS

## 2015-02-26 MED ORDER — LANOLIN HYDROUS EX OINT: 1.0000 "application " | TOPICAL_OINTMENT | CUTANEOUS | Status: DC | PRN

## 2015-02-26 MED ORDER — LACTATED RINGERS IV SOLN: INTRAVENOUS | Status: DC

## 2015-02-26 MED ORDER — ONDANSETRON HCL 4 MG/2ML IJ SOLN
INTRAMUSCULAR | Status: AC
  Filled 2015-02-26: qty 2

## 2015-02-26 MED ORDER — FENTANYL 2.5 MCG/ML BUPIVACAINE 1/10 % EPIDURAL INFUSION (WH - ANES)
14.0000 mL/h | INTRAMUSCULAR | Status: DC | PRN
  Administered 2015-02-26: 12 mL/h via EPIDURAL
  Administered 2015-02-26: 14 mL/h via EPIDURAL
  Filled 2015-02-26: qty 125

## 2015-02-26 MED ORDER — FENTANYL CITRATE (PF) 250 MCG/5ML IJ SOLN
INTRAMUSCULAR | Status: DC | PRN
  Administered 2015-02-26: 50 ug via EPIDURAL

## 2015-02-26 MED ORDER — BUPIVACAINE HCL (PF) 0.25 % IJ SOLN
INTRAMUSCULAR | Status: DC | PRN
  Administered 2015-02-26 (×2): 4 mL

## 2015-02-26 MED ORDER — SUCCINYLCHOLINE CHLORIDE 20 MG/ML IJ SOLN
INTRAMUSCULAR | Status: AC
  Filled 2015-02-26: qty 1

## 2015-02-26 MED ORDER — OXYTOCIN 40 UNITS IN LACTATED RINGERS INFUSION - SIMPLE MED
62.5000 mL/h | INTRAVENOUS | Status: DC
  Filled 2015-02-26: qty 1000

## 2015-02-26 MED ORDER — FENTANYL CITRATE (PF) 100 MCG/2ML IJ SOLN: 50.0000 ug | INTRAMUSCULAR | Status: DC | PRN

## 2015-02-26 MED ORDER — OXYTOCIN BOLUS FROM INFUSION: 500.0000 mL | INTRAVENOUS | Status: DC

## 2015-02-26 MED ORDER — ZOLPIDEM TARTRATE 5 MG PO TABS: 5.0000 mg | ORAL_TABLET | Freq: Every evening | ORAL | Status: DC | PRN

## 2015-02-26 MED ORDER — SIMETHICONE 80 MG PO CHEW
80.0000 mg | CHEWABLE_TABLET | ORAL | Status: DC
  Administered 2015-02-26 – 2015-02-28 (×3): 80 mg via ORAL
  Filled 2015-02-26 (×3): qty 1

## 2015-02-26 MED ORDER — DEXAMETHASONE SODIUM PHOSPHATE 10 MG/ML IJ SOLN
INTRAMUSCULAR | Status: AC
  Filled 2015-02-26: qty 1

## 2015-02-26 MED ORDER — PROPOFOL 10 MG/ML IV BOLUS
INTRAVENOUS | Status: AC
  Filled 2015-02-26: qty 20

## 2015-02-26 MED ORDER — LIDOCAINE-EPINEPHRINE (PF) 2 %-1:200000 IJ SOLN
INTRAMUSCULAR | Status: DC | PRN
Start: 2015-02-26 — End: 2015-02-26
  Administered 2015-02-26: 3 mL

## 2015-02-26 MED ORDER — FENTANYL CITRATE (PF) 250 MCG/5ML IJ SOLN
INTRAMUSCULAR | Status: AC
  Filled 2015-02-26: qty 5

## 2015-02-26 MED ORDER — DEXAMETHASONE SODIUM PHOSPHATE 10 MG/ML IJ SOLN
INTRAMUSCULAR | Status: DC | PRN
  Administered 2015-02-26: 10 mg via INTRAVENOUS

## 2015-02-26 MED ORDER — PHENYLEPHRINE 40 MCG/ML (10ML) SYRINGE FOR IV PUSH (FOR BLOOD PRESSURE SUPPORT)
PREFILLED_SYRINGE | INTRAVENOUS | Status: AC
  Filled 2015-02-26: qty 20

## 2015-02-26 MED ORDER — KETOROLAC TROMETHAMINE 30 MG/ML IJ SOLN
INTRAMUSCULAR | Status: AC
  Administered 2015-02-26: 30 mg via INTRAMUSCULAR
  Filled 2015-02-26: qty 1

## 2015-02-26 MED ORDER — SIMETHICONE 80 MG PO CHEW
80.0000 mg | CHEWABLE_TABLET | ORAL | Status: DC | PRN
  Administered 2015-03-01: 80 mg via ORAL
  Filled 2015-02-26: qty 1

## 2015-02-26 MED ORDER — NALOXONE HCL 0.4 MG/ML IJ SOLN: 0.4000 mg | INTRAMUSCULAR | Status: DC | PRN

## 2015-02-26 MED ORDER — SODIUM BICARBONATE 8.4 % IV SOLN
INTRAVENOUS | Status: DC | PRN
  Administered 2015-02-26 (×4): 5 mL via EPIDURAL

## 2015-02-26 MED ORDER — ACETAMINOPHEN 500 MG PO TABS
1000.0000 mg | ORAL_TABLET | Freq: Four times a day (QID) | ORAL | Status: AC
Start: 2015-02-26 — End: 2015-02-27
  Administered 2015-02-26: 1000 mg via ORAL
  Filled 2015-02-26: qty 2

## 2015-02-26 MED ORDER — PHENYLEPHRINE 40 MCG/ML (10ML) SYRINGE FOR IV PUSH (FOR BLOOD PRESSURE SUPPORT)
80.0000 ug | PREFILLED_SYRINGE | INTRAVENOUS | Status: DC | PRN
  Filled 2015-02-26: qty 20

## 2015-02-26 MED ORDER — DIPHENHYDRAMINE HCL 25 MG PO CAPS
25.0000 mg | ORAL_CAPSULE | ORAL | Status: DC | PRN
  Administered 2015-02-27: 25 mg via ORAL
  Filled 2015-02-26: qty 1

## 2015-02-26 MED ORDER — LIDOCAINE HCL (CARDIAC) 20 MG/ML IV SOLN
INTRAVENOUS | Status: AC
  Filled 2015-02-26: qty 5

## 2015-02-26 MED ORDER — TETANUS-DIPHTH-ACELL PERTUSSIS 5-2.5-18.5 LF-MCG/0.5 IM SUSP
0.5000 mL | Freq: Once | INTRAMUSCULAR | Status: DC
Start: 2015-02-27 — End: 2015-03-01

## 2015-02-26 MED ORDER — MENTHOL 3 MG MT LOZG: 1.0000 | LOZENGE | OROMUCOSAL | Status: DC | PRN

## 2015-02-26 MED ORDER — SCOPOLAMINE 1 MG/3DAYS TD PT72: 1.0000 | MEDICATED_PATCH | Freq: Once | TRANSDERMAL | Status: DC

## 2015-02-26 MED ORDER — LIDOCAINE HCL (PF) 1 % IJ SOLN
30.0000 mL | INTRAMUSCULAR | Status: DC | PRN
  Filled 2015-02-26: qty 30

## 2015-02-26 MED ORDER — METOCLOPRAMIDE HCL 5 MG/ML IJ SOLN
INTRAMUSCULAR | Status: AC
  Filled 2015-02-26: qty 2

## 2015-02-26 MED ORDER — FENTANYL CITRATE (PF) 100 MCG/2ML IJ SOLN: 25.0000 ug | INTRAMUSCULAR | Status: DC | PRN

## 2015-02-26 MED ORDER — ONDANSETRON HCL 4 MG/2ML IJ SOLN: 4.0000 mg | Freq: Three times a day (TID) | INTRAMUSCULAR | Status: DC | PRN

## 2015-02-26 MED ORDER — OXYCODONE-ACETAMINOPHEN 5-325 MG PO TABS: 1.0000 | ORAL_TABLET | ORAL | Status: DC | PRN

## 2015-02-26 MED ORDER — NALBUPHINE HCL 10 MG/ML IJ SOLN
5.0000 mg | INTRAMUSCULAR | Status: DC | PRN
  Administered 2015-02-26 – 2015-02-27 (×3): 5 mg via INTRAVENOUS
  Filled 2015-02-26 (×3): qty 1

## 2015-02-26 MED ORDER — OXYTOCIN 40 UNITS IN LACTATED RINGERS INFUSION - SIMPLE MED
INTRAVENOUS | Status: DC | PRN
  Administered 2015-02-26: 40 [IU] via INTRAVENOUS

## 2015-02-26 MED ORDER — SIMETHICONE 80 MG PO CHEW
80.0000 mg | CHEWABLE_TABLET | Freq: Three times a day (TID) | ORAL | Status: DC
  Administered 2015-02-26 – 2015-03-01 (×9): 80 mg via ORAL
  Filled 2015-02-26 (×10): qty 1

## 2015-02-26 MED ORDER — LACTATED RINGERS IV SOLN
500.0000 mL | INTRAVENOUS | Status: DC | PRN
  Administered 2015-02-26: 500 mL via INTRAVENOUS

## 2015-02-26 MED ORDER — OXYTOCIN 40 UNITS IN LACTATED RINGERS INFUSION - SIMPLE MED: 62.5000 mL/h | INTRAVENOUS | Status: AC

## 2015-02-26 MED ORDER — SCOPOLAMINE 1 MG/3DAYS TD PT72
MEDICATED_PATCH | TRANSDERMAL | Status: AC
  Filled 2015-02-26: qty 1

## 2015-02-26 MED ORDER — DIPHENHYDRAMINE HCL 25 MG PO CAPS: 25.0000 mg | ORAL_CAPSULE | Freq: Four times a day (QID) | ORAL | Status: DC | PRN

## 2015-02-26 MED ORDER — OXYCODONE-ACETAMINOPHEN 5-325 MG PO TABS
1.0000 | ORAL_TABLET | ORAL | Status: DC | PRN
  Administered 2015-02-27 (×2): 1 via ORAL
  Filled 2015-02-26 (×2): qty 1

## 2015-02-26 MED ORDER — PHENYLEPHRINE HCL 10 MG/ML IJ SOLN
INTRAMUSCULAR | Status: DC | PRN
  Administered 2015-02-26 (×3): 40 ug via INTRAVENOUS
  Administered 2015-02-26: 80 ug via INTRAVENOUS
  Administered 2015-02-26: 40 ug via INTRAVENOUS

## 2015-02-26 MED ORDER — KETOROLAC TROMETHAMINE 30 MG/ML IJ SOLN
30.0000 mg | Freq: Four times a day (QID) | INTRAMUSCULAR | Status: DC | PRN
  Administered 2015-02-26: 30 mg via INTRAMUSCULAR

## 2015-02-26 MED ORDER — SODIUM CHLORIDE 0.9 % IJ SOLN: 3.0000 mL | INTRAMUSCULAR | Status: DC | PRN

## 2015-02-26 MED ORDER — MORPHINE SULFATE (PF) 0.5 MG/ML IJ SOLN
INTRAMUSCULAR | Status: DC | PRN
  Administered 2015-02-26: 4 mg via EPIDURAL

## 2015-02-26 MED ORDER — MEPERIDINE HCL 25 MG/ML IJ SOLN
INTRAMUSCULAR | Status: AC
  Filled 2015-02-26: qty 1

## 2015-02-26 MED ORDER — WITCH HAZEL-GLYCERIN EX PADS: 1.0000 "application " | MEDICATED_PAD | CUTANEOUS | Status: DC | PRN

## 2015-02-26 MED ORDER — NALBUPHINE HCL 10 MG/ML IJ SOLN: 5.0000 mg | Freq: Once | INTRAMUSCULAR | Status: AC | PRN

## 2015-02-26 MED ORDER — NALOXONE HCL 1 MG/ML IJ SOLN: 1.0000 ug/kg/h | INTRAVENOUS | Status: DC | PRN

## 2015-02-26 MED ORDER — EPHEDRINE 5 MG/ML INJ: 10.0000 mg | INTRAVENOUS | Status: DC | PRN

## 2015-02-26 MED ORDER — MEPERIDINE HCL 25 MG/ML IJ SOLN: 6.2500 mg | INTRAMUSCULAR | Status: DC | PRN

## 2015-02-26 MED ORDER — SENNOSIDES-DOCUSATE SODIUM 8.6-50 MG PO TABS
2.0000 | ORAL_TABLET | ORAL | Status: DC
  Administered 2015-02-26 – 2015-02-28 (×3): 2 via ORAL
  Filled 2015-02-26 (×3): qty 2

## 2015-02-26 MED ORDER — LACTATED RINGERS IV SOLN
INTRAVENOUS | Status: DC | PRN
  Administered 2015-02-26: 09:00:00 via INTRAVENOUS

## 2015-02-26 MED ORDER — KETOROLAC TROMETHAMINE 30 MG/ML IJ SOLN: 30.0000 mg | Freq: Four times a day (QID) | INTRAMUSCULAR | Status: DC | PRN

## 2015-02-26 MED ORDER — VALACYCLOVIR HCL 500 MG PO TABS: 500.0000 mg | ORAL_TABLET | Freq: Every day | ORAL | Status: DC | PRN

## 2015-02-26 MED ORDER — METOCLOPRAMIDE HCL 5 MG/ML IJ SOLN
INTRAMUSCULAR | Status: DC | PRN
  Administered 2015-02-26: 10 mg via INTRAVENOUS

## 2015-02-26 MED ORDER — CEFAZOLIN SODIUM-DEXTROSE 2-3 GM-% IV SOLR
INTRAVENOUS | Status: AC
  Filled 2015-02-26: qty 50

## 2015-02-26 MED ORDER — DIBUCAINE 1 % RE OINT: 1.0000 "application " | TOPICAL_OINTMENT | RECTAL | Status: DC | PRN

## 2015-02-26 MED ORDER — PROMETHAZINE HCL 25 MG/ML IJ SOLN: 6.2500 mg | INTRAMUSCULAR | Status: DC | PRN

## 2015-02-26 MED ORDER — LACTATED RINGERS IV SOLN
INTRAVENOUS | Status: DC
  Administered 2015-02-26: 07:00:00 via INTRAVENOUS

## 2015-02-26 MED ORDER — MIDAZOLAM HCL 2 MG/2ML IJ SOLN
INTRAMUSCULAR | Status: AC
  Filled 2015-02-26: qty 2

## 2015-02-26 MED ORDER — IBUPROFEN 600 MG PO TABS
600.0000 mg | ORAL_TABLET | Freq: Four times a day (QID) | ORAL | Status: DC
  Administered 2015-02-26 – 2015-03-01 (×12): 600 mg via ORAL
  Filled 2015-02-26 (×12): qty 1

## 2015-02-26 SURGICAL SUPPLY — 31 items
ADH SKN CLS LQ APL DERMABOND (GAUZE/BANDAGES/DRESSINGS) ×1
APL SKNCLS STERI-STRIP NONHPOA (GAUZE/BANDAGES/DRESSINGS) ×1
BENZOIN TINCTURE PRP APPL 2/3 (GAUZE/BANDAGES/DRESSINGS) ×1 IMPLANT
CLAMP CORD UMBIL (MISCELLANEOUS) ×1 IMPLANT
CLOTH BEACON ORANGE TIMEOUT ST (SAFETY) ×2 IMPLANT
DERMABOND ADHESIVE PROPEN (GAUZE/BANDAGES/DRESSINGS) ×1
DERMABOND ADVANCED .7 DNX6 (GAUZE/BANDAGES/DRESSINGS) IMPLANT
DRAPE SHEET LG 3/4 BI-LAMINATE (DRAPES) ×1 IMPLANT
DRSG OPSITE POSTOP 4X10 (GAUZE/BANDAGES/DRESSINGS) ×2 IMPLANT
DURAPREP 26ML APPLICATOR (WOUND CARE) ×2 IMPLANT
ELECT REM PT RETURN 9FT ADLT (ELECTROSURGICAL) ×2
ELECTRODE REM PT RTRN 9FT ADLT (ELECTROSURGICAL) ×1 IMPLANT
GLOVE BIO SURGEON STRL SZ7 (GLOVE) ×2 IMPLANT
GLOVE BIOGEL PI IND STRL 7.0 (GLOVE) ×1 IMPLANT
GLOVE BIOGEL PI INDICATOR 7.0 (GLOVE) ×1
GOWN STRL REUS W/TWL LRG LVL3 (GOWN DISPOSABLE) ×4 IMPLANT
KIT ABG SYR 3ML LUER SLIP (SYRINGE) ×2 IMPLANT
NDL HYPO 25X5/8 SAFETYGLIDE (NEEDLE) ×1 IMPLANT
NEEDLE HYPO 25X5/8 SAFETYGLIDE (NEEDLE) ×2 IMPLANT
NS IRRIG 1000ML POUR BTL (IV SOLUTION) ×2 IMPLANT
PACK C SECTION WH (CUSTOM PROCEDURE TRAY) ×2 IMPLANT
PAD OB MATERNITY 4.3X12.25 (PERSONAL CARE ITEMS) ×2 IMPLANT
RTRCTR C-SECT PINK 25CM LRG (MISCELLANEOUS) ×2 IMPLANT
STRIP CLOSURE SKIN 1/2X4 (GAUZE/BANDAGES/DRESSINGS) ×1 IMPLANT
SUT MNCRL AB 3-0 PS2 27 (SUTURE) ×1 IMPLANT
SUT VIC AB 0 CT1 36 (SUTURE) ×1 IMPLANT
SUT VIC AB 0 CTX 36 (SUTURE) ×10
SUT VIC AB 0 CTX36XBRD ANBCTRL (SUTURE) ×5 IMPLANT
SUT VIC AB 3-0 PS2 18 (SUTURE) ×1 IMPLANT
TOWEL OR 17X24 6PK STRL BLUE (TOWEL DISPOSABLE) ×2 IMPLANT
TRAY FOLEY CATH SILVER 14FR (SET/KITS/TRAYS/PACK) ×2 IMPLANT

## 2015-02-26 NOTE — Anesthesia Preprocedure Evaluation (Addendum)
Anesthesia Evaluation  Patient identified by MRN, date of birth, ID band Patient awake    Reviewed: Allergy & Precautions, Patient's Chart, lab work & pertinent test results  History of Anesthesia Complications Negative for: history of anesthetic complications  Airway Mallampati: III  TM Distance: >3 FB Neck ROM: Full    Dental  (+) Teeth Intact   Pulmonary neg pulmonary ROS,  breath sounds clear to auscultation        Cardiovascular hypertension, negative cardio ROS  Rhythm:Regular     Neuro/Psych  Headaches, Currently complaining of a headache for which she has been taking tylenol  negative psych ROS   GI/Hepatic negative GI ROS, Neg liver ROS,   Endo/Other  negative endocrine ROS  Renal/GU negative Renal ROS     Musculoskeletal   Abdominal   Peds  Hematology negative hematology ROS (+)   Anesthesia Other Findings   Reproductive/Obstetrics (+) Pregnancy                            Anesthesia Physical Anesthesia Plan  ASA: II  Anesthesia Plan: Epidural   Post-op Pain Management:    Induction:   Airway Management Planned:   Additional Equipment:   Intra-op Plan:   Post-operative Plan:   Informed Consent: I have reviewed the patients History and Physical, chart, labs and discussed the procedure including the risks, benefits and alternatives for the proposed anesthesia with the patient or authorized representative who has indicated his/her understanding and acceptance.   Dental advisory given  Plan Discussed with: Anesthesiologist  Anesthesia Plan Comments:         Anesthesia Quick Evaluation

## 2015-02-26 NOTE — Progress Notes (Signed)
Final Labor Progress Note At [phone] the primary nurse call to report the pt was complete and she pushing was about to start.  At 0848 I arrived at the bedside, FHR 115 w/good variability and variable decels noted at 0850.  Scalp stim at 0853.  Reoccur ant decel to the 90's noted at 0856, FSE placed at 0857.  At 0900 I gave the order to call Faculty and Dr Su Hiltoberts to the bedside stat and to call the OR for a possible CS.  Deidre Poe CNM from faculty came to room to assist.  Vacuum applied by D. Poe CNM, pressure applied and released x2, vacuum assist failed.  FHT in the 80's, D.Poe CNM called for 2nd vacuum to be placed on the table as Dr Elsie LincolnKelly Leggett MD arrived at the bedside.  Dr Penne LashLeggett reviewed the R&B of a vacuum, pt elected for a repeat CS.  Pt transferred to the OR for a stat CS.

## 2015-02-26 NOTE — Addendum Note (Signed)
Addendum  created 02/26/15 1556 by Algis GreenhouseLinda A Rockford Leinen, CRNA   Modules edited: Notes Section   Notes Section:  File: [id number]

## 2015-02-26 NOTE — Progress Notes (Signed)
Patient ID: Bernette MayersShaneka L Fernandez, female   DOB: 09/16/1990, 24 y.o.   MRN: [id number]   Called at 9:02 am by Synetta FailAnita RN that [name] needed a vacuum for fetal distress.  Midwife of [facility], Venus, had first asked CNM Deirdre Po to come to room for a vacuum at 9:00 a.m.  Upon arrival to the room, midwife Po was having difficulty having the vacuum stay applied to the head (see her note for further details).  As I was running to the room, I asked to have the OR on stand by for possible emergent c/s.  I arrived at the room and determined fetal position is ROT.  I spoke with patient about the vacuum delivery.  Risks of vacuum assistance were discussed in detail, including but not limited to, bleeding, infection, damage to maternal tissues, fetal cephalohematoma, inability to effect vaginal delivery of the head or shoulder dystocia that cannot be resolved by established maneuvers and need for emergency cesarean section.  Patient was also offered an emergent c/s and she chose for the emergent c/s.  The risks of cesarean section discussed with the patient included but were not limited to: bleeding which may require transfusion or reoperation; infection which may require antibiotics; injury to bowel, bladder, ureters or other surrounding organs; injury to the fetus; need for additional procedures including hysterectomy in the event of a life-threatening hemorrhage; placental abnormalities wth subsequent pregnancies, incisional problems, thromboembolic phenomenon and other postoperative/anesthesia complications. The patient concurred with the proposed plan, giving informed written consent for the procedure.   Lesly DukesLEGGETT,Tiana Sivertson H., MD

## 2015-02-26 NOTE — Anesthesia Postprocedure Evaluation (Signed)
Anesthesia Post Note  Patient: Kara Fernandez  Procedure(s) Performed: Procedure(s) (LRB): CESAREAN SECTION (N/A)  Anesthesia type: Epidural  Patient location: Mother/Baby  Post pain: Pain level controlled  Post assessment: Post-op Vital signs reviewed  Last Vitals:  Filed Vitals:   02/26/15 1412  BP: 117/68  Pulse: 73  Temp: 36.8 C  Resp: 24    Post vital signs: Reviewed  Level of consciousness:alert  Complications: No apparent anesthesia complications

## 2015-02-26 NOTE — MAU Note (Signed)
Pt reports contractions and pressure.  

## 2015-02-26 NOTE — Op Note (Signed)
Cesarean Section Procedure Note  Indications: Pt was in labor and pushing and fetal bradycardia/fetal intolerance of labor was noted and faculty practice   Pre-operative Diagnosis: Fetal Intolerance of Labor/Fetal Bradycardia/Failed Vacuum   Post-operative Diagnosis: Fetal Intolerance of Labor/Fetal Bradycardia/Failed Vacuum  Procedure: REPEAT CESAREAN SECTION  Surgeon: Osborn CohoAngela Anaily Ashbaugh, MD/Dr. Penne LashLeggett (Faculty attending started the surgery)  Assistants: Venus Standard, CNM  Anesthesia: Regional  Anesthesiologist: Dr. Kathrynn RunningManning   Procedure Details  The patient was taken to the operating room secondary to fetal bradycardia/fetal intolerance of labor after the risks, benefits, complications, treatment options, and expected outcomes were discussed with the patient.  The patient concurred with the proposed plan, giving informed consent which was signed and witnessed. The patient was taken to Operating Room C-Section Suite, identified as Kara Fernandez and the procedure verified as C-Section Delivery. A Time Out was held and the above information confirmed emergently per report.  After induction of anesthesia by obtaining a surgical level via epidural, the patient underwent a betadine prep and draped in the usual sterile manner. A Pfannenstiel skin incision was made and carried down through the subcutaneous tissue to the underlying layer of fascia.  The fascia was entered as well as the peritoneum and a low transverse uterine incision was made with the scalpel and extended bilaterally.  The infant was delivered from a vertex (per report OT position) presentation by Dr. Penne LashLeggett.  After the umbilical cord was clamped and cut, the infant was handed to the awaiting pediatricians.  The placenta was removed intact and appeared to be within normal limits. The uterus was cleared of all clots and debris.  At this point in time I relieved Dr. Penne LashLeggett after she placed the angle stitch in the uterus and the  uterine incision was closed with a running interlocking suture of 0 Vicryl and a second imbricating layer was performed as well.   Bilateral tubes and ovaries appeared to be within normal limits.  Good hemostasis was noted.  Copious irrigation was performed until clear.  The peritoneum was repaired with 2-0 chromic via a running suture.  The fascia was reapproximated with a running suture of 0 Vicryl. The subcutaneous tissue was reapproximated with 3 interrupted sutures of 2-0 plain.  The skin was reapproximated with a subcuticular suture of 3-0 monocryl.  Liquaband was applied.  Instrument, sponge, and needle counts were correct prior to abdominal closure and at the conclusion of the case.  The patient was awaiting transfer to the recovery room in good condition.  I was called back to the room as the patient had the beginning of an episiotomy incision performed.  A 1st degree perineal incision was repaired with 3-0 vicry via a subcuticular stitch.  Findings: Live female infant with Apgars 8 at one minute and 9 at five minutes.  Normal appearing bilateral ovaries and fallopian tubes were noted.  Estimated Blood Loss:  See flowsheet         Drains: See flowsheet         Total IV Fluids: See flowsheet         Specimens to Pathology: Placenta         Complications:  None; patient tolerated the procedure well.         Disposition: PACU - hemodynamically stable.         Condition: stable  Attending Attestation: I performed the procedure as discussed above with the exception of the start.  Please see CNM notes for events leading up to the c-section.

## 2015-02-26 NOTE — Transfer of Care (Signed)
Immediate Anesthesia Transfer of Care Note  Patient: Kara MayersShaneka L Aleman  Procedure(s) Performed: Procedure(s): CESAREAN SECTION (N/A)  Patient Location: PACU  Anesthesia Type:Epidural  Level of Consciousness: awake, alert  and oriented  Airway & Oxygen Therapy: Patient Spontanous Breathing  Post-op Assessment: Report given to RN and Post -op Vital signs reviewed and stable  Post vital signs: Reviewed and stable  Last Vitals:  Filed Vitals:   02/26/15 0830  BP: 108/63  Pulse: 74  Temp:   Resp: 20    Complications: No apparent anesthesia complications

## 2015-02-26 NOTE — Progress Notes (Signed)
Kara MayersShaneka L Fernandez MRN: [id number]  Subjective: -Nurse call reporting patient reports "I am about to have the baby."  In room to assess.  Patient reporting pain and discomfort with contractions.   Objective: BP 81/29 mmHg  Pulse 74  Temp(Src) 98.7 F (37.1 C) (Oral)  Resp 18  Ht 5\' 1"  (1.549 m)  Wt 73.483 kg (162 lb)  BMI 30.63 kg/m2  SpO2 100%  LMP 05/23/2014   Total I/O In: -  Out: 100 [Urine:100] FHT: 130 bpm, Mod Var, -Decels, +Accels UC: Q2-55min, palpates moderate to strong   SVE:   Dilation: 7 Effacement (%): 90 Station: -1 Exam by::  (Cataldo Cosgriff) Membranes:AROM Pitocin:None  Assessment:  IUP at 40.3wks Cat I FT  Active Labor-Transition  Plan: -Position change to promote fetal descent -PCA bolus to promote patient comfort -Expectant mgmt -Continue other mgmt as ordered   Naftuli Dalsanto LYNN,MSN, CNM 02/26/2015, 5:15 AM

## 2015-02-26 NOTE — Addendum Note (Signed)
Addendum  created 02/26/15 1615 by Graciela HusbandsWynn O Shaden Lacher, CRNA   Modules edited: Anesthesia Events, Anesthesia Medication Administration, Narrator   Narrator:  Narrator: Event Log Edited

## 2015-02-26 NOTE — Anesthesia Procedure Notes (Signed)
Epidural Patient location during procedure: OB  Staffing Anesthesiologist: Charlina Dwight, CHRIS Performed by: anesthesiologist   Preanesthetic Checklist Completed: patient identified, surgical consent, pre-op evaluation, timeout performed, IV checked, risks and benefits discussed and monitors and equipment checked  Epidural Patient position: sitting Prep: site prepped and draped and DuraPrep Patient monitoring: heart rate, cardiac monitor, continuous pulse ox and blood pressure Approach: midline Location: L3-L4 Injection technique: LOR saline  Needle:  Needle type: Tuohy  Needle gauge: 17 G Needle length: 9 cm Needle insertion depth: 8 cm Catheter type: closed end flexible Catheter size: 19 Gauge Catheter at skin depth: 13 cm Test dose: negative and 2% lidocaine with Epi 1:200 K  Assessment Events: blood not aspirated, injection not painful, no injection resistance, negative IV test and no paresthesia  Additional Notes H+P and labs checked, risks and benefits discussed with the patient, consent obtained, procedure tolerated well and without complications.  Reason for block:procedure for pain

## 2015-02-26 NOTE — Progress Notes (Signed)
At 8:57, pt was actively pushing, but FHR difficult to trace since baby was so low (baby head visible at introitus). Scalp electrode placed by Venus Standard, CNM but did not maintain continuous tracing. At approximately 9:00, additional nursing help was called to the room to assist. Faculty practice was called to the room to assist with vaccuum delivery. Dr Su Hiltoberts was called and notified of pt's status. Bernita Buffy. Poe, CNM attempted vaccuum assisted delivery, but a seal was never formed between the baby's head and the vaccuum. When Dr. Penne LashLeggett arrived in the room, she assessed the position of the baby and gave patient option of vaccuum assisted delivery or repeat c/s. Patient opted for repeat c/s and was prepared for stat c/s. Pt left the room at approximately 9:07.

## 2015-02-26 NOTE — Progress Notes (Signed)
Kara MayersShaneka L Fernandez MRN: [id number]  Subjective: -Patient reporting comfort s/p epidural.  Reports headache that has been present since prior to arrival.   Objective: BP 118/72 mmHg  Pulse 85  Temp(Src) 98.7 F (37.1 C) (Oral)  Resp 18  Ht 5\' 1"  (1.549 m)  Wt 73.483 kg (162 lb)  BMI 30.63 kg/m2  SpO2 100%  LMP 05/23/2014     FHT: 130 bpm, Mod Var, -Decels, +Accels UC: Q2-574min, palpates moderate to strong   SVE:   Dilation: 3 Effacement (%): 90 Station: -1 Exam by:: J. Kelleigh Skerritt CNM Membranes:AROM of Sm Amt MSF Pitocin:None  Assessment:  IUP at 40.3wks Cat I FT  Early Active Labor Amniotomy  Plan: -Insert foley catheter -Patient denies needs at current -Will continue to monitor -Continue other mgmt as ordered   Enrique Weiss LYNN,MSN, CNM 02/26/2015, 3:52 AM

## 2015-02-26 NOTE — H&P (Signed)
Kara Fernandez is a 24 y.o. female, G3P1011 at 40.3 weeks, presenting for contractions.  Patient states she has been contracting since yesterday and despite rest and hydration contractions have increased in frequency and intensity.  Patient is GBS negative.  Patient is TOLAC with first delivery by C/S for fetal distress.  Patient denies LOF and VB and reports active fetus.   Patient Active Problem List   Diagnosis Date Noted  . Vaginal bleeding in pregnancy 07/10/2014  . Bleeding in early pregnancy 06/30/2014  . Lactating mother 01/01/2012  . S/P cesarean section 12/30/2011  . Fetal heart rate decelerations affecting management of mother 12/30/2011  . History of chlamydia 12/29/2011  . History of gonorrhea 12/29/2011  . History of asthma - childhood 12/29/2011  . Anemia 12/29/2011  . 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 Pregnancy as incidental finding 04/29/2011    History of present pregnancy: Patient entered care at 12.5 weeks.   EDC of 02/24/2015 was established by 7.3wk [place] on 07/10/2015.   Anatomy scan:  18.4 weeks, with normal findings and an posterior placenta.   Additional [place] evaluations:   -Anatomy U/S: EFW 264g, appropriate interval growth, SIUP, complete, normal appearing female Significant prenatal events:  2nd Trimester: Pt c/o vaginal irritation. Patient with low TSH and referred to endocrinologist. 3rd Trimester: Pt c/o increased swelling in lower right leg and discomfort at c/s incision. Pt c/o coughing, runny nose, and congestion. Pt c/o knot under her right arm and assessment found bilateral lymphadenopathy more consistent with mammary enlargement with pregnancy. Advised warm compresses. Patient also c/o vaginal pressure, heart burn, and diarrhea.  Last evaluation:  02/22/2015   39 wks 6 days  By L.Montez Morita, FNP. SVE: 2cm / 80% / -1 BP: 100/70 Wt: 162 lbs  OB History    Gravida Para Term Preterm AB TAB SAB Ectopic Multiple Living   Past  Medical History  Diagnosis Date  . Chlamydia infection     POSITIVE 10/06,8/07,7/09,11/11 ;at age 31  . ASCUS on Pap smear     C&B 10/09 W/LGSIL  . HSV (herpes simplex virus) infection   . 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 Gonorrhea     POSITIVE 8/06, 10/06; at age 44  . GC (gonococcus infection)   . Trichomonal vulvovaginitis   . Ovarian cyst   . UTI (lower urinary tract infection)   . 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 H/O varicella   . History of high blood pressure   . Yeast infection   . H/O bacterial infection     frequently  . Abnormal Pap smear     completed  at age 44  . Asthma     as a child   . Angina ~2000    enlarged heart, pain due to stress  . Ovarian cyst   . H/O multiple allergies   . Vaginal Pap smear, abnormal   . Hypertension     not while rpegnant   Past Surgical History  Procedure Laterality Date  . Dilation and curettage of uterus      abortion  . Cesarean section  12/29/2011    Procedure: CESAREAN SECTION;  Surgeon: Purcell Nails, MD;  Location: [facility];  Service: Gynecology;  Laterality: N/A;   Family History: family history includes Alcohol abuse in  her maternal uncle and paternal uncle; Hypertension in her father, maternal aunt, maternal grandfather, maternal grandmother, maternal uncle, and mother; Stroke in her maternal grandmother. Social History:  reports that she has never smoked. She has never used smokeless tobacco. She reports that she drinks about 0.6 oz of alcohol per week. She reports that she uses illicit drugs (Marijuana).   Prenatal Transfer Tool  Maternal Diabetes: No Genetic Screening: Normal Maternal Ultrasounds/Referrals: Normal Fetal Ultrasounds or other Referrals:  None Maternal Substance Abuse:  Yes:  Type: Marijuana Significant Maternal Medications:  Meds include: Other: Valtrex Significant Maternal Lab Results: Lab values include: Group B Strep  negative    ROS:  +Ctx, -LoF, -VB, +FM  No Known Allergies   Dilation: 3 Effacement (%): 90 Station: -1 Exam by:: J. Fatoumata Albaugh CNM Blood pressure 108/64, pulse 95, temperature 98.3 F (36.8 C), resp. rate 16, height 5\' 1"  (1.549 m), weight 73.483 kg (162 lb), last menstrual period 05/23/2014.  Physical Exam  Vitals reviewed. Constitutional: She is oriented to person, place, and time. She appears well-developed and well-nourished.  HENT:  Head: Normocephalic and atraumatic.  Eyes: EOM are normal. Pupils are equal, round, and reactive to light.  Neck: Normal range of motion.  Cardiovascular: Normal rate.   Respiratory: Effort normal and breath sounds normal.  GI: There is no tenderness.  Genitourinary: Vagina normal. There is no rash or lesion on the right labia. There is no rash or lesion on the left labia.  Musculoskeletal: Normal range of motion.  Neurological: She is alert and oriented to person, place, and time.  Skin: Skin is warm and dry.     FHR: 145 bpm, Mod Var, -Decels, +Accels UCs:  Q1-763min, palpates moderate to strong  Prenatal labs: ABO, Rh: B/Positive/-- (12/14 0000) Antibody: Negative (12/14 0000) Rubella:   Immune RPR: Nonreactive (12/14 0000)  HBsAg: Negative (12/14 0000)  HIV: NONREACTIVE (10/25 1000)  GBS:  Negative Sickle cell/Hgb electrophoresis:  A2-WNL Pap:  WNL 2013 GC:  Negative Chlamydia:  Negative Other:  07/2014: TSH 0.016   Assessment IUP at 40.3wks Cat I FT Early Labor Post Dates TOLAC  [facility]: [facility]  Routine Labor and Delivery Orders per [facility] Protocol Okay for epidural once in active labor May ambulate in halls after reactive NST Dr.A. Su Hiltoberts to be updated as appropriate  Phillips ClimesEMLY, [name] LYNNCNM, MSN 02/26/2015, 1:27 AM

## 2015-02-26 NOTE — Anesthesia Postprocedure Evaluation (Signed)
  Anesthesia Post-op Note  Patient: Kara Fernandez  Procedure(s) Performed: Procedure(s): CESAREAN SECTION (N/A)  Patient Location: PACU  Anesthesia Type:Epidural  Level of Consciousness: awake, alert  and oriented  Airway and Oxygen Therapy: Patient Spontanous Breathing  Post-op Pain: none  Post-op Assessment: Post-op Vital signs reviewed, Patient's Cardiovascular Status Stable, Respiratory Function Stable, Patent Airway and No signs of Nausea or vomiting LLE Motor Response: No movement due to regional block   RLE Motor Response: No movement due to regional block        Post-op Vital Signs: Reviewed and stable  Last Vitals:  Filed Vitals:   02/26/15 1045  BP: 96/60  Pulse: 93  Temp: 36.9 C  Resp: 21    Complications: No apparent anesthesia complications

## 2015-02-26 NOTE — Op Note (Signed)
Kara MayersShaneka L Fernandez PROCEDURE DATE: 02/26/2015  PREOPERATIVE DIAGNOSIS: Intrauterine pregnancy at  10859w3d weeks gestation with fetal bradycardia refusing vacuum and prior cesarean section.  POSTOPERATIVE DIAGNOSIS: Intrauterine pregnancy at  6759w3d weeks gestation with fetal bradycardia refusing vacuum and prior cesarean section.  PROCEDURE:    Repeat Low Transverse Cesarean Section  SURGEON:  Dr. Elsie LincolnKelly Alicha Raspberry  ASSISTANT: Dr. Osborn CohoAngela Roberts &  Venus Standard, CNM  INDICATIONS: Kara Fernandez is a 24 y.o. G3P1011 at 4959w3d with fetal bradycardia, prior cesarean section, and refusing vacuum delivery.  Patient prefers emergency repeat cesarean section.  The risks of cesarean section discussed with the patient included but were not limited to: bleeding which may require transfusion or reoperation; infection which may require antibiotics; injury to bowel, bladder, ureters or other surrounding organs; injury to the fetus; need for additional procedures including hysterectomy in the event of a life-threatening hemorrhage; placental abnormalities wth subsequent pregnancies, incisional problems, thromboembolic phenomenon and other postoperative/anesthesia complications. The patient concurred with the proposed plan, giving informed written consent for the procedure.    FINDINGS:  Viable female  infant in cephalic ROT presentation, 8,9 Apgars, weight to be determined in 1 hour, thick meconium statined amniotic fluid.  Intact placenta, three vessel cord.  Grossly normal uterus, ovaries and fallopian tubes. .   ANESTHESIA:    Epidural  ESTIMATED BLOOD LOSS: minimal at time of delivery of fetus and placenta, and start of uterine closure. (see Dr. Su Hiltoberts note for final EBL after closure).  SPECIMENS: Placenta sent to pathology  COMPLICATIONS: None immediate  PROCEDURE IN DETAIL:  The patient received intravenous antibiotics and had sequential compression devices applied to her lower extremities.  Epidural  anesthesia was dosed up to surgical level and was found to be adequate. She was then placed in a dorsal supine position with a leftward tilt, and splash betadine prep given due to continued fetal bradycardia.  A foley catheter was placed into her bladder and attached to constant gravity.  After an adequate timeout was performed, a Pfannenstiel skin incision was made with scalpel and carried through to the underlying layer of fascia. The fascia was incised in the midline and this incision was extended bilaterally using the Mayo scissors. Kocher clamps were applied to the superior aspect of the fascial incision and the underlying rectus muscles were dissected off bluntly. A similar process was carried out on the inferior aspect of the facial incision. The rectus muscles were separated in the midline bluntly and the peritoneum was entered bluntly.   A transverse hysterotomy was made with a scalpel and extended bilaterally bluntly. The bladder blade was then removed. The infant was successfully delivered, and cord was clamped and cut and infant was handed over to awaiting neonatology team. Uterine massage was then administered and the placenta delivered intact with three-vessel cord. The uterus was cleared of clot and debris.  Omental adhesions were taken down off the fundus of the uterus with use of 0-Vicryl free ties.  I began to close the hysterotomy with 0-Vicryl when Dr. Su Hiltoberts arrived and scrubbed into the case.  I updated her on what had occurred thus far.

## 2015-02-26 NOTE — Lactation Note (Signed)
This note was copied from the chart of Kara Fernandez. Lactation Consultation Note  Patient Name: Kara Fernandez ZOXWR'UToday's Date: 02/26/2015 Reason for consult: Initial assessment Baby 6 hours old. Mom states that she nursed first child for a month, and only issue was that baby wouldn't latch to one side, so she pumped that breast. Mom reports this baby nursing well. Enc mom to nurse with cues. [facility], aware of OP/BFSG, community resources, and [facility] assistance after [facility]/C.  Maternal Data Has patient been taught Hand Expression?: Yes (Per patient.) Does the patient have breastfeeding experience prior to this delivery?: Yes  Feeding Feeding Type: Breast Fed Length of feed: 20 min  LATCH Score/Interventions Latch: Repeated attempts needed to sustain latch, nipple held in mouth throughout feeding, stimulation needed to elicit sucking reflex. Intervention(s): Skin to skin;Teach feeding cues;Waking techniques Intervention(s): Adjust position;Assist with latch  Audible Swallowing: A few with stimulation Intervention(s): Skin to skin  Type of Nipple: Everted at rest and after stimulation  Comfort (Breast/Nipple): Soft / non-tender     Hold (Positioning): Assistance needed to correctly position infant at breast and maintain latch. Intervention(s): Breastfeeding basics reviewed;Support Pillows;Position options;Skin to skin  LATCH Score: 7  Lactation Tools Discussed/Used     Consult Status Consult Status: Follow-up Date: 02/27/15 Follow-up type: In-patient    Geralynn OchsWILLIARD, Amada Hallisey 02/26/2015, 4:05 PM

## 2015-02-26 NOTE — Progress Notes (Signed)
Patient ID: Kara MayersShaneka L Fernandez, female   DOB: 09/20/1990, 24 y.o.   MRN: [id number]  [id number] TOLAC Called at 0900 to come to room to help with a vacuum; Dr. Penne LashLeggett made aware. Arrived to find patient pushing with large caput visible at introitus, FHR 80's. Was told by CNM that pt was aware of POC for VAVD and Kiwi was on hand. I attempted to place Kiwi without success and asked for Dr. Penne LashLeggett to be called just prior to her arrival.

## 2015-02-27 ENCOUNTER — Encounter (HOSPITAL_COMMUNITY): Payer: Self-pay | Admitting: Obstetrics and Gynecology

## 2015-02-27 DIAGNOSIS — Z98891 History of uterine scar from previous surgery: Secondary | ICD-10-CM

## 2015-02-27 HISTORY — DX: History of uterine scar from previous surgery: Z98.891

## 2015-02-27 LAB — CBC
HEMATOCRIT: 25.3 % — AB (ref 36.0–46.0)
HEMOGLOBIN: 7.9 g/dL — AB (ref 12.0–15.0)
MCH: 20.6 pg — AB (ref 26.0–34.0)
MCHC: 31.2 g/dL (ref 30.0–36.0)
MCV: 65.9 fL — ABNORMAL LOW (ref 78.0–100.0)
Platelets: 311 10*3/uL (ref 150–400)
RBC: 3.84 MIL/uL — ABNORMAL LOW (ref 3.87–5.11)
RDW: 17.2 % — ABNORMAL HIGH (ref 11.5–15.5)
WBC: 22.1 10*3/uL — ABNORMAL HIGH (ref 4.0–10.5)

## 2015-02-27 MED ORDER — FERROUS SULFATE 325 (65 FE) MG PO TABS
325.0000 mg | ORAL_TABLET | Freq: Two times a day (BID) | ORAL | Status: DC
  Administered 2015-02-27 – 2015-03-01 (×4): 325 mg via ORAL
  Filled 2015-02-27 (×4): qty 1

## 2015-02-27 NOTE — Progress Notes (Signed)
CLINICAL SOCIAL WORK MATERNAL/CHILD NOTE  Patient Details  Name: Kara Fernandez MRN: [id number] Date of Birth: 02/26/2017  Date:  02/27/2015  Clinical Social Worker Initiating Note:  Nijae Doyel, LCSW Date/ Time Initiated:  02/27/15/1300     Child's Name:  Kara Fernandez   Legal Guardian:  Nala and Antonio (parents)  Need for Interpreter:  None   Date of Referral:  02/26/15     Reason for Referral:  Current Substance Use/Substance Use During Pregnancy    Referral Source:  [facility]   Address:  [address] ,  [address]  Phone number:  [phone]   Household Members:  Minor Children (Sandy: 12/29/11), Significant Other   Natural Supports (not living in the home):  Immediate Family, Extended Family   Professional Supports: None   Employment: Full-time   Type of Work: Hairdresser at [employer]   Education:    N/A  Financial Resources:  Medicaid   Other Resources:  Food Stamps , WIC   Cultural/Religious Considerations Which May Impact Care:  None reported  Strengths:  Ability to meet basic needs , Pediatrician chosen , Home prepared for child    Risk Factors/Current Problems:   1) Substance Use: MOB presents with history of THC use. MOB denied any use during the pregnancy, she reported last use was 1-2 months prior to the pregnancy. Infant's UDS and MDS are pending.   Cognitive State:  Able to Concentrate , Alert , Insightful , Linear Thinking , Goal Oriented    Mood/Affect:  Bright , Happy , Interested    CSW Assessment:  CSW received request for consult due to MOB presenting with a history of marijuana use.  MOB presented as easily engaged and receptive to the visit. She was noted to be in a pleasant mood and displayed a full range in affect. MOB smiled frequently as she interacted with the infant, and presented as bonding and interacting well.  MOB did not present with any acute mental health symptoms, and presented as calm and  comfortable providing infant care.   MOB denied questions, concerns, or needs as she transitions to the postpartum period. She reflected upon her emergency c-section and discussed belief that she is coping well since she has a history of an emergency c-section and felt that the c-section was in the best interest of the infant. She shared that she knows that she will have to recover, but discussed that it is better for her to have to recover than to cause harm to the infant since the infant was not responding well to the vacuum extraction.  MOB shared that overall she is content with her birthing story.  She discussed the differences between this infant and her son, and shared that this infant cries more frequently and is "feisty".  MOB expressed belief that she feels more comfortable and prepared for these behaviors since she is not a first time parent and recognizes that it is okay for an infant to cry.  MOB discussed that it continues to feel surreal that the infant has been born, she shared that she is excited and happy for her arrival.  CSW validated and normalized the range in emotions that accompany the transition to the postpartum period.   MOB endorsed strong support system, and shared that both sides of the infant's family are very involved. She shared that the families have ensured that the home is well prepared, and MOB discussed how it has been helpful since she does not have to worry   about having diapers, wipes, or clothing items.  MOB expressed that she can now focus on the infant instead of stressing about financial needs when she is not working.    MOB denied history of mental health symptoms, and denied history of perinatal mood and anxiety disorders. She shared that she has a friend who is currently experiencing postpartum depression and recognizes the need to intervene if onset of symptoms occur.  MOB shared belief that her mother will also confront her if she notes symptoms. MOB agreed to  contact her medical provider if she begins to feel anxious and overwhelmed. MOB presented as attentive and engaged as CSW reviewed signs and symptoms.   Per MOB, substance use history is significant for history of marijuana use. MOB denied any use during the pregnancy, and reported last use was 1-2 months pre-pregnancy. MOB voiced understanding for the hospital drug screen policy, and denied concerns about the infant's UDS and MDS collection.    MOB denied additional questions, concerns, or needs at this time. She expressed appreciation for the visit and agreed to contact CSW if needs arise during the admission.   CSW Plan/Description:   1)Patient/Family Education : Perinatal mood and anxiety disorders, hospital drug screen policy 2) CSW to monitor infant's drug screens and will notify CPS if there is a positive result. 3)No Further Intervention Required/No Barriers to Discharge    Kelby FamVenning, Keaton Stirewalt N, LCSW 02/27/2015, 1:41 PM

## 2015-02-27 NOTE — Lactation Note (Signed)
This note was copied from the chart of Girl Alvira PhilipsShaneka Dimare. Lactation Consultation Note  Patient Name: Girl Alvira PhilipsShaneka Peckham ZOXWR'UToday's Date: 02/27/2015  Baby 30 hours old , and consistently breast feeding .  8-15 mins , and attempts. 2 wets , 1 stool. Per mom baby is breast feeding well both sides , in football position.  LC encouraged mom to page for assistance as needed with latching.     Maternal Data    Feeding Feeding Type: Breast Fed Length of feed: 10 min (per mom )  LATCH Score/Interventions                      Lactation Tools Discussed/Used     Consult Status      Kathrin Greathouseorio, Ayn Domangue Ann 02/27/2015, 4:15 PM

## 2015-02-27 NOTE — Progress Notes (Signed)
Kara MayersShaneka L Fernandez [id number]  Subjective: Postpartum Day 1: Repeat LTC/S due to NRFHR (failed TOLAC) Patient up ad lib, reports no syncope or dizziness. Feeding:  Breast Contraceptive plan:  Undecided  Objective: Temp:  [97.8 F (36.6 C)-98.5 F (36.9 C)] 98 F (36.7 C) (07/05 0540) Pulse Rate:  [70-93] 83 (07/05 0540) Resp:  [16-24] 20 (07/05 0540) BP: (92-121)/(50-96) 92/64 mmHg (07/05 0540) SpO2:  [94 %-100 %] 96 % (07/05 0540)   Recent Labs  02/26/15 0205 02/27/15 0515  HGB 11.3* 7.9*  HCT 34.6* 25.3*  WBC 10.3 22.1*   Orthostatics stable  Physical Exam:  General: alert Lochia: appropriate Uterine Fundus: firm Abdomen:  + bowel sounds Incision: Honeycomb dressing CDI DVT Evaluation: No evidence of DVT seen on physical exam. Negative Homan's sign.   Assessment/Plan: Status post cesarean delivery, day 1. Anemia without hemodynamic instability Mild leukocytosis Stable Continue current care. Repeat CBC tomorrow Fe BID Patient declines transfusion.   [name], Michaele Amundson MSN, CNM 02/27/2015, 10:20 AM

## 2015-02-28 LAB — CBC
HEMATOCRIT: 24.4 % — AB (ref 36.0–46.0)
Hemoglobin: 7.7 g/dL — ABNORMAL LOW (ref 12.0–15.0)
MCH: 20.8 pg — ABNORMAL LOW (ref 26.0–34.0)
MCHC: 31.6 g/dL (ref 30.0–36.0)
MCV: 65.9 fL — ABNORMAL LOW (ref 78.0–100.0)
Platelets: 346 10*3/uL (ref 150–400)
RBC: 3.7 MIL/uL — AB (ref 3.87–5.11)
RDW: 17.4 % — ABNORMAL HIGH (ref 11.5–15.5)
WBC: 15.6 10*3/uL — ABNORMAL HIGH (ref 4.0–10.5)

## 2015-02-28 MED ORDER — MEDROXYPROGESTERONE ACETATE 150 MG/ML IM SUSP: 150.0000 mg | Freq: Once | INTRAMUSCULAR | Status: DC

## 2015-02-28 NOTE — Lactation Note (Addendum)
This note was copied from the chart of Kara Fernandez. Lactation Consultation Note  Patient Name: Kara Alvira PhilipsShaneka Dishon ZOXWR'UToday's Date: 02/28/2015 Reason for consult: Follow-up assessment (LC updated feedings per mom in doc flow sheets )  Baby is 5953 hour old and per mom recently fed 20 mins at 130 pm.  Voiding and stooling for age. Per mom when the baby is feeding will be in a swallowing and then takes a break , and I re-latch. LC recommended calling on nurses light for feeding assessment . Mom denies soreness.  Family presently visiting. LC updated doc flow sheets .   Maternal Data    Feeding Feeding Type: Breast Fed Length of feed: 20 min (per mom )  LATCH Score/Interventions                Intervention(s): Breastfeeding basics reviewed     Lactation Tools Discussed/Used     Consult Status Consult Status: Follow-up Date: 03/01/15 Follow-up type: In-patient    Kathrin Greathouseorio, Jhada Risk Ann 02/28/2015, 2:39 PM

## 2015-02-28 NOTE — Progress Notes (Signed)
Kara MayersShaneka L Fernandez [id number]  Subjective: Postpartum Day 2: [facility] Patient up ad lib, reports no syncope or dizziness. More pain today, largely soreness. Feeding:  Breast Contraceptive plan:  Depo at discharge  Objective: Temp:  [97.5 F (36.4 C)-98.6 F (37 C)] 98.6 F (37 C) (07/06 0622) Pulse Rate:  [66-87] 87 (07/06 0622) Resp:  [18] 18 (07/06 0622) BP: (87-105)/(46-63) 92/46 mmHg (07/06 0644) SpO2:  [97 %] 97 % (07/05 0945)   Recent Labs  02/26/15 0205 02/27/15 0515 02/28/15 0520  HGB 11.3* 7.9* 7.7*  HCT 34.6* 25.3* 24.4*  WBC 10.3 22.1* 15.6*   Orthostatics stable.  Physical Exam:  General: alert Lochia: appropriate Uterine Fundus: firm Abdomen:  + bowel sounds Incision: Honeycomb dressing CDI DVT Evaluation: No evidence of DVT seen on physical exam. Negative Homan's sign.   Assessment/Plan: Status post cesarean delivery, day 2 Anemia without hemodynamic instability Stable Continue current care. Plan for discharge tomorrow  Depo Provera IM tomorrow.    [name], Nijee Heatwole MSN, CNM 02/28/2015, 9:11 AM

## 2015-02-28 NOTE — Discharge Summary (Signed)
  Cesarean Section Delivery Discharge Summary  Kara Fernandez  DOB:    06/04/1991 MRN:    [id number] CSN:    [id number]  Date of admission:                02/26/15     Date of discharge:                  03/01/15  Procedures this admission:     [facility] and failed [facility]  Date of Delivery: 02/26/15  Newborn Data:  Live born female  Birth Weight: 8 lb 7.1 oz (3830 g) APGAR: 8, 9  Baby remaining as "baby patient" at time of mother's d/c due to jaundice, on bili blanket.  History of Present Illness:  Kara Fernandez is a 24 y.o. female, [id number], who presents at 2368w3d weeks gestation. [facility]   Her pregnancy has been complicated by:  Patient Active Problem List   Diagnosis Date Noted  . Previous cesarean section 02/27/2015  . Status post repeat low transverse cesarean section--[facility] 02/27/2015  . Vaginal bleeding in pregnancy 07/10/2014  . Fetal heart rate decelerations affecting management of mother 12/30/2011  . History of chlamydia 12/29/2011  . History of gonorrhea 12/29/2011  . History of asthma - childhood 12/29/2011  . Anemia 12/29/2011  . 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 Penne LashLeggett, then Dr. Su Hiltoberts Complications: None  Intrapartum Procedures: cesarean: low cervical, transverse, repeat Postpartum Procedures: none Complications-Operative and Postpartum: Swelling and pain in right leg on day 3--doppler flow study and anesthesia consult done. No post-anesthesia issues identified, and doppler flow study negative.  Likely musculoskeletal due to pushing pressure on nerves and muscles.  Responds to  Percocet. Anemia without hemodynamic compromise, declined transfusion.  Discharge Diagnoses: Term Pregnancy-delivered, previous C/S, failed VE, repeat C/S  Feeding:  breast  Contraception:  Depo-Provera  Hemoglobin Results:  CBC CBC Latest Ref Rng 02/28/2015 02/27/2015 02/26/2015  WBC 4.0 - 10.5 K/uL 15.6(H) 22.1(H) 10.3  Hemoglobin 12.0 - 15.0 g/dL 7.7(L) 7.9(L) 11.3(L)  Hematocrit 36.0 - 46.0 % 24.4(L) 25.3(L) 34.6(L)  Platelets 150 - 400 K/uL [phone]      Discharge Physical Exam:   General: alert Lochia: appropriate Uterine Fundus: firm Abdomen:  + bowel sounds Incision: healing well, Honeycomb dressing CDI DVT Evaluation: No evidence of DVT seen on physical exam. Negative Homan's sign.  Discharge Information:  Activity:           pelvic rest Diet:                routine Medications: Ibuprofen and Percocet Condition:      stable Instructions:  Discharge to: home     [name], Reshanda Lewey CNM 02/28/2015 12:47 PM

## 2015-03-01 ENCOUNTER — Ambulatory Visit (HOSPITAL_COMMUNITY): Payer: Medicaid Other

## 2015-03-01 DIAGNOSIS — M79604 Pain in right leg: Secondary | ICD-10-CM

## 2015-03-01 MED ORDER — OXYCODONE-ACETAMINOPHEN 5-325 MG PO TABS: 1.0000 | ORAL_TABLET | ORAL | Status: DC | PRN

## 2015-03-01 MED ORDER — IBUPROFEN 600 MG PO TABS: 600.0000 mg | ORAL_TABLET | Freq: Four times a day (QID) | ORAL | Status: DC | PRN

## 2015-03-01 NOTE — Progress Notes (Signed)
Right lower extremity venous duplex completed.  Right:  No evidence of DVT, superficial thrombosis, or Baker's cyst.  Left:  Negative for DVT in the common femoral vein.  

## 2015-03-01 NOTE — Progress Notes (Signed)
R Leg weakness. More swollen than LLeg  Doppler studies ordered

## 2015-03-01 NOTE — Progress Notes (Signed)
Pt complaining of R leg pain. Sudden pain that has increased throughout the night. Pt complaining of pain behind knee while sitting in bed. Pt states that when she tries to walk to the bathroom that the leg becomes very weak and she feels like she is "dragging" the leg to the bathroom while walking. Pt vital signs normal. No warmth or pain in the calf area. MD made aware of the pain and sudden change of weakness in the R leg.   Aseel Truxillo W Elajah Kunsman, RN

## 2015-03-01 NOTE — Lactation Note (Signed)
This note was copied from the chart of Kara Fernandez Venturino. Lactation Consultation Note  Patient Name: Kara Fernandez Brummitt ZOXWR'UToday's Date: 03/01/2015 Reason for consult: Follow-up assessment;Hyperbilirubinemia Baby 73 hours old. Baby on double photo therapy and mom reports baby difficult to latch with lights on. Assisted mom to free baby's arms, but enc leaving lights on baby while nursing. Mom reports that she has milk collected under each arm, and states that she was told she basically has a "third breast under her left arm." Mom given ice pack to use for 10-15 under one arm and against out aspect of breast before switching to the same set up under opposite arm. Mom's breasts are filling and her left breast is tight. Assisted mom to start pumping. Mom able to pump 15 mls. Enc mom to pump after each feeding in order to be reading for next feeding with EBM for supplementation and so breasts will be softened.   Assisted mom to latch baby to left breast, mom's breasts are semi-compressible, but baby not comfortable with latch. Baby nursed for a few minutes, but fussy at breast. Demonstrated to parents how to set up #46French feeding tube and baby was supplemented with 10mls of EBM at breast through tube while FOB assisted. Demonstrated to FOB how to clean all equipment. Plan is for mom to pump in an hour, massaging tight areas and hand expressing after pumping. Enc mom to put baby to breast with cues, and at least every 3 hours. Enc mom to supplement using feeding tube, and then pump after feeding.   Discussed assessment, interventions, and plan with patient's RN Maralyn SagoSarah.  Maternal Data    Feeding Feeding Type: Breast Fed Length of feed: 10 min  LATCH Score/Interventions Latch: Repeated attempts needed to sustain latch, nipple held in mouth throughout feeding, stimulation needed to elicit sucking reflex. Intervention(s): Skin to skin Intervention(s): Adjust position;Assist with latch;Breast  compression  Audible Swallowing: Spontaneous and intermittent  Type of Nipple: Everted at rest and after stimulation  Comfort (Breast/Nipple): Soft / non-tender     Hold (Positioning): Assistance needed to correctly position infant at breast and maintain latch. Intervention(s): Breastfeeding basics reviewed;Support Pillows;Skin to skin  LATCH Score: 8  Lactation Tools Discussed/Used Tools: 46F feeding tube / Syringe   Consult Status Consult Status: Follow-up Date: 03/02/15 Follow-up type: In-patient    Geralynn OchsWILLIARD, Gaylord Seydel 03/01/2015, 10:35 AM

## 2015-03-01 NOTE — Progress Notes (Addendum)
  Kara MayersShaneka L Fernandez [id number]  Subjective: Postpartum Day 3: Repeat [facility] Patient up ad lib, denies syncope or dizziness.  Reporting pain and swelling in right leg, mainly in upper leg/thigh, particularly when walking and bearing weight.  Just noted during early am.  Denies numbness in leg or decreased sensation--feels "weak" in that leg due to pain. Feeding:  Breast Contraceptive plan:  Depo  Baby under bili blanket--has not stooled yet.  Objective: Temp:  [97.9 F (36.6 C)-98 F (36.7 C)] 98 F (36.7 C) (07/07 0544) Pulse Rate:  [77-91] 91 (07/07 0544) Resp:  [18] 18 (07/07 0544) BP: (98-106)/(52-61) 106/54 mmHg (07/07 0544) SpO2:  [98 %] 98 % (07/07 0544)   Recent Labs  02/27/15 0515 02/28/15 0520  HGB 7.9* 7.7*  HCT 25.3* 24.4*  WBC 22.1* 15.6*    Physical Exam:  General: alert Lochia: appropriate Uterine Fundus: firm Abdomen:  + bowel sounds Incision: Honeycomb dressing loose along bottom edge. DVT Evaluation:  Right leg noted to be more edematous than left, 1+ edema noted, negative Homan's.  Pain in thigh with flexion of knee. Normal sensation to pinprick and touch. Left leg without edema or pain on flexion.  Negative Homan's.  Assessment/Plan: Status post cesarean delivery, day 3 Pain and swelling in right leg Anemia without hemodynamic instability--declines transfusion.  Plan: Doppler flow study of right leg--study will be done sometime after 10:30am [facility] Anesthesia to see Anticipate official d/c today if doppler study WNL--may end up as baby patient due to jaundice. Will see later today for f/u.    Kara BridgemanLATHAM, Kara Fernandez, Kara Fernandez 03/01/2015, 8:13 AM    I saw and examined patient and agree with above findings, assessment and plan.  Extremities with edema, right leg with 2+ edema in thigh and leg, slight tenderness behind knee but no erythema or calf tenderness.  Left leg and thigh with 1+  edema, no warmth or erythema in leg. Leg symptoms likely from edema and recent pushing. Continue with close monitoring, pain meds prn and ambulation, discussed DVT precautions. Dr. Sallye OberKulwa.

## 2015-03-01 NOTE — Progress Notes (Signed)
Asked to see patient regarding weakness and swelling right leg post op. She delivered by C/Section on July 4th after pushing for 20-30 minutes. She states her legs were hyperflexed during that time. She had an epidural for labor and this was used for anesthesia for her C/Section and worked reasonably well. Her block resolved after the surgery and she was ambulating without difficulty. Approximately 24 hours after her C/Section she started developing swelling of her lower extremities right greater than left.and developed left thigh pain down to her left mid leg accompanied by weakness. Lower extremity dopplers were obtained which showed no evidence of a DVT. She denies any dysesthesias, numbness or tingling of her lower extremities, nor any back discomfort. I don't believe her current symptoms are related to her epidural.

## 2015-03-02 ENCOUNTER — Ambulatory Visit: Payer: Self-pay

## 2015-03-02 NOTE — Lactation Note (Signed)
This note was copied from the chart of Kara Alvira PhilipsShaneka Worsley. Lactation Consultation Note  Patient Name: Kara Fernandez GNFAO'ZToday's Date: 03/02/2015 Reason for consult: Follow-up assessment;Hyperbilirubinemia Baby 694 days old. Mom reports that baby is nursing well but is somewhat sleepy still so she is continuing to supplement. Mom states that it is easier for her to use syringe and finger-feed or use bottle. Enc mom to use whatever method she chooses, but to continue supplementing with her EBM. Mom states that she has a DEBP at home. Enc mom to continue putting baby to breast first, supplementing with EBM/FO, and post-pumping. Enc mom to feed with cues and at least every 3 hours. Mom not sure when the baby will be discharged. Discussed engorgement prevention/treatment. Enc mom to call for assistance as needed. Mom aware of OP/BFSG and LC phone line assistance after D/C.  Maternal Data    Feeding Feeding Type: Breast Fed Length of feed: 15 min  LATCH Score/Interventions                      Lactation Tools Discussed/Used Tools: Other (comment) (Curve-tipped syringe.)   Consult Status Consult Status: Follow-up Date: 03/03/15 Follow-up type: In-patient    Kara Fernandez, Kara Fernandez 03/02/2015, 9:28 AM

## 2015-11-06 ENCOUNTER — Emergency Department (HOSPITAL_COMMUNITY)
Admission: EM | Admit: 2015-11-06 | Discharge: 2015-11-06 | Disposition: A | Discharge status: Home / Self Care | Payer: Medicaid Other | Attending: Emergency Medicine | Admitting: Emergency Medicine

## 2015-11-06 ENCOUNTER — Emergency Department (HOSPITAL_COMMUNITY): Payer: Medicaid Other

## 2015-11-06 ENCOUNTER — Encounter (HOSPITAL_COMMUNITY): Payer: Self-pay | Admitting: *Deleted

## 2015-11-06 DIAGNOSIS — I10 Essential (primary) hypertension: Secondary | ICD-10-CM | POA: Insufficient documentation

## 2015-11-06 DIAGNOSIS — F17200 Nicotine dependence, unspecified, uncomplicated: Secondary | ICD-10-CM | POA: Insufficient documentation

## 2015-11-06 DIAGNOSIS — R079 Chest pain, unspecified: Secondary | ICD-10-CM | POA: Insufficient documentation

## 2015-11-06 DIAGNOSIS — I209 Angina pectoris, unspecified: Secondary | ICD-10-CM | POA: Insufficient documentation

## 2015-11-06 DIAGNOSIS — J45901 Unspecified asthma with (acute) exacerbation: Secondary | ICD-10-CM | POA: Insufficient documentation

## 2015-11-06 LAB — BASIC METABOLIC PANEL
ANION GAP: 11 (ref 5–15)
BUN: 8 mg/dL (ref 6–20)
CALCIUM: 8.9 mg/dL (ref 8.9–10.3)
CHLORIDE: 109 mmol/L (ref 101–111)
CO2: 21 mmol/L — AB (ref 22–32)
Creatinine, Ser: 0.79 mg/dL (ref 0.44–1.00)
GFR calc Af Amer: >60 mL/min (ref >60)
GFR calc non Af Amer: >60 mL/min (ref >60)
GLUCOSE: 111 mg/dL — AB (ref 65–99)
Potassium: 3.7 mmol/L (ref 3.5–5.1)
Sodium: 141 mmol/L (ref 135–145)

## 2015-11-06 LAB — CBC
HCT: 40.4 % (ref 36.0–46.0)
HEMOGLOBIN: 12.7 g/dL (ref 12.0–15.0)
MCH: 21.7 pg — AB (ref 26.0–34.0)
MCHC: 31.4 g/dL (ref 30.0–36.0)
MCV: 69.2 fL — AB (ref 78.0–100.0)
PLATELETS: 328 10*3/uL (ref 150–400)
RBC: 5.84 MIL/uL — ABNORMAL HIGH (ref 3.87–5.11)
RDW: 15.1 % (ref 11.5–15.5)
WBC: 6.3 10*3/uL (ref 4.0–10.5)

## 2015-11-06 LAB — I-STAT BETA HCG BLOOD, ED (MC, WL, AP ONLY)

## 2015-11-06 LAB — I-STAT TROPONIN, ED: Troponin i, poc: 0 ng/mL (ref 0.00–0.08)

## 2015-11-06 NOTE — ED Notes (Signed)
Pt is here with chest pain and states she has an enlarged heart and did not follow up with a cardiologist.  Reports she has to sleep on pillows to help her breath.

## 2015-11-06 NOTE — ED Notes (Signed)
Pt sts she is leaving and can not wait any longer

## 2015-11-07 ENCOUNTER — Emergency Department (HOSPITAL_COMMUNITY)
Admission: EM | Admit: 2015-11-07 | Discharge: 2015-11-07 | Disposition: A | Discharge status: Home / Self Care | Payer: Medicaid Other | Attending: Dermatology | Admitting: Dermatology

## 2015-11-07 ENCOUNTER — Encounter (HOSPITAL_COMMUNITY): Payer: Self-pay | Admitting: *Deleted

## 2015-11-07 DIAGNOSIS — I10 Essential (primary) hypertension: Secondary | ICD-10-CM | POA: Insufficient documentation

## 2015-11-07 DIAGNOSIS — R079 Chest pain, unspecified: Secondary | ICD-10-CM | POA: Insufficient documentation

## 2015-11-07 DIAGNOSIS — I209 Angina pectoris, unspecified: Secondary | ICD-10-CM | POA: Insufficient documentation

## 2015-11-07 DIAGNOSIS — F17200 Nicotine dependence, unspecified, uncomplicated: Secondary | ICD-10-CM | POA: Insufficient documentation

## 2015-11-07 NOTE — ED Notes (Signed)
Patient called in main ED waiting area with no response 

## 2015-11-07 NOTE — ED Notes (Signed)
No answer when called 

## 2015-11-07 NOTE — ED Notes (Signed)
Pt is here with chest pain and was seen here yesterday and states continues to have chest pain

## 2015-11-07 NOTE — ED Notes (Signed)
Patient called to revitalize in main ED waiting area with no response

## 2015-11-14 ENCOUNTER — Encounter: Payer: Self-pay | Admitting: Internal Medicine

## 2015-11-14 ENCOUNTER — Ambulatory Visit (INDEPENDENT_AMBULATORY_CARE_PROVIDER_SITE_OTHER): Payer: Self-pay | Admitting: Internal Medicine

## 2015-11-14 VITALS — BP 120/88 | HR 72 | Ht 61.0 in | Wt 159.3 lb

## 2015-11-14 DIAGNOSIS — M940 Chondrocostal junction syndrome [Tietze]: Secondary | ICD-10-CM

## 2015-11-14 MED ORDER — IBUPROFEN 800 MG PO TABS: 800.0000 mg | ORAL_TABLET | Freq: Two times a day (BID) | ORAL | Status: DC

## 2015-11-14 NOTE — Progress Notes (Signed)
Patient ID: Bernette MayersShaneka L Lira, female   DOB: 08/19/1991, 25 y.o.   MRN: [id number]    OFFICE NOTE  Chief Complaint:  Chest Pain  Primary Care Physician: Michael LitterILLARD,NAIMA A, MD  HPI:  Bernette MayersShaneka L Tat is a 25 y.o. female no significant past medical history who presents today after presenting to the ED twice last week with chest pain. She says that she has had episodes of chest pain and fast heart beat about 4 times over the past 5 years. She says this time, she felt a sharp pain in her lower sternum almost to her stomach that radiated to the right. The pain was constant and worse with moving, taking a deep breath / coughing. She says that it hurt to press on it as well. She also noted redness and swelling over the area. She says she went to the ED but the wait was too long so she left. The pain lasted two days and then went away. She did not have any nausea, diaphoresis or arm pain during this episode. She did feel that her heart beat was fest, but did not note it to be irregular. She endorses a small amount of lower extremity edema, though she says it is at the end of the day after being on her feet all day for work. She has been pregnant twice in the last four years. She was found to have a thyroid abnormality per the patient during her first pregnancy for which she never followed up with the endocrinologist. She also says that she was admitted to the hospital for a "swollen heart" in the past, though they did not intervene and only "watched her" and then sent her home.   No family history of early cardiac death, early MI, or family history of lipid disorders.  She does smoke marijuana, she does not drink or use tobacco products.  She has not started any medications recently  PMHx:  Past Medical History  Diagnosis Date  . Chlamydia infection     POSITIVE 10/06,8/07,7/09,11/11 ;at age 25  . ASCUS on Pap smear     C&B 10/09 W/LGSIL  . HSV (herpes simplex virus) infection   . 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 Gonorrhea     POSITIVE 8/06, 10/06; at age 25  . GC (gonococcus infection)   . Trichomonal vulvovaginitis   . Ovarian cyst   . UTI (lower urinary tract infection)   . 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 H/O varicella   . History of high blood pressure   . Yeast infection   . H/O bacterial infection     frequently  . Abnormal Pap smear     completed  at age 25  . Asthma     as a child   . Angina ~2000    enlarged heart, pain due to stress  . Ovarian cyst   . H/O multiple allergies   . Vaginal Pap smear, abnormal   . Hypertension     not while rpegnant    Past Surgical History  Procedure Laterality Date  . Dilation and curettage of uterus      abortion  . Cesarean section  12/29/2011    Procedure: CESAREAN SECTION;  Surgeon: Purcell NailsAngela Y Roberts, MD;  Location: [facility];  Service: Gynecology;  Laterality: N/A;  . Cesarean section N/A 02/26/2015    Procedure: CESAREAN SECTION;  Surgeon: Osborn Coho, MD;  Location: [facility];  Service: Obstetrics;  Laterality: N/A;    FAMHx:  Family History  Problem Relation Age of Onset  . Hypertension Mother   . Hypertension Father   . Hypertension Maternal Grandmother   . Stroke Maternal Grandmother   . Hypertension Maternal Grandfather   . Hypertension Maternal Aunt   . Hypertension Maternal Uncle   . Alcohol abuse Maternal Uncle   . Alcohol abuse Paternal Uncle     SOCHx:   reports that she has quit smoking. She has never used smokeless tobacco. She reports that she drinks about 0.6 oz of alcohol per week. She reports that she uses illicit drugs (Marijuana).  ALLERGIES:  No Known Allergies  ROS: Pertinent items are noted in HPI.  HOME MEDS: Current Outpatient Prescriptions  Medication Sig Dispense Refill  . ibuprofen (ADVIL,MOTRIN) 600 MG tablet Take 1 tablet (600 mg total) by mouth every 6 (six) hours as needed. 30 tablet 2  .  oxyCODONE-acetaminophen (PERCOCET/ROXICET) 5-325 MG per tablet Take 1 tablet by mouth every 4 (four) hours as needed (for pain scale 4-7). 30 tablet 0  . Prenatal Vit-Fe Fumarate-FA (PRENATAL VITAMINS) 28-0.8 MG TABS Take 1 tablet by mouth daily. 30 tablet 5  . ibuprofen (ADVIL,MOTRIN) 800 MG tablet Take 1 tablet (800 mg total) by mouth 2 (two) times daily. Take for 2 weeks. 28 tablet 0   No current facility-administered medications for this visit.    LABS/IMAGING: No results found for this or any previous visit (from the past 48 hour(s)). No results found.  WEIGHTS: Wt Readings from Last 3 Encounters:  11/14/15 159 lb 5 oz (72.264 kg)  11/07/15 159 lb 6 oz (72.292 kg)  11/06/15 154 lb 2 oz (69.911 kg)    VITALS: BP 120/88 mmHg  Pulse 72  Ht  (1.549 m)  Wt 159 lb 5 oz (72.264 kg)  BMI 30.12 kg/m2  EXAM: General appearance: alert, cooperative and no distress Neck: no adenopathy, no carotid bruit, no JVD, supple, symmetrical, trachea midline and thyroid not enlarged, symmetric, no tenderness/mass/nodules Lungs: clear to auscultation bilaterally Heart: regular rate and rhythm, S1, S2 normal, no murmur, click, rub or gallop Abdomen: soft, non-tender; bowel sounds normal; no masses,  no organomegaly Extremities: extremities normal, atraumatic, no cyanosis or edema Pulses: 2+ and symmetric Skin: Skin color, texture, turgor normal. No rashes or lesions  EKG: 3/22: Normal sinus rhythm, no abnormality noted.   ASSESSMENT: 1. Acute Costochondritis  PLAN: 1.   Ibuprofen  BID.  2.   Consider TSH in the future with ? History of thyroid abnormality.  3.   Follow up in 1-2 months to ensure resolution.   Pattrick Bady 11/14/2015, 5:57 PM  Pt. Seen and examined. Agree with the NP/PA-C note as written. Mrs. Shoemaker is a pleasant 25 year old female who presented to the ER twice to be seen for chest pain. However given the long wait she left after checking in without being  seen by a physician. She reports that several weeks ago she had an upper respiratory infection which preceded the onset of a lower sternal, xiphoid area pain that is clearly worse with lifting, movement and very tender to the touch. She also thought she saw some swelling over the xiphoid process. She denies any worsening chest pain or shortness of breath with exertion. She says she's dealt with this type of discomfort on and off for several years and it does seem to be associated with stress, inflammation or  infection. Symptoms to me sound very typical for acute costochondritis. Her EKG is normal. Vitals are all normal. She has few if any risk factors for obstructive coronary disease. She occasionally gets palpitations. We will go ahead and check a TSH. I'm recommending a trial of anti-inflammatories with ibuprofen 800 mg twice a day 2 weeks. She is advised to continue to take the medication even if the pain resolves for anti-inflammatory benefit.  Follow-up as needed.  Chrystie Nose, MD, [facility] Attending Cardiologist [facility]

## 2015-11-14 NOTE — Patient Instructions (Addendum)
Take ibuprofen 800 twice daily for 2 weeks   Your physician recommends that you schedule a follow-up appointment in: ONE MONTH   Costochondritis Costochondritis, sometimes called Tietze syndrome, is a swelling and irritation (inflammation) of the tissue (cartilage) that connects your ribs with your breastbone (sternum). It causes pain in the chest and rib area. Costochondritis usually goes away on its own over time. It can take up to 6 weeks or longer to get better, especially if you are unable to limit your activities. CAUSES  Some cases of costochondritis have no known cause. Possible causes include:  Injury (trauma).  Exercise or activity such as lifting.  Severe coughing. SIGNS AND SYMPTOMS  Pain and tenderness in the chest and rib area.  Pain that gets worse when coughing or taking deep breaths.  Pain that gets worse with specific movements. DIAGNOSIS  Your health care provider will do a physical exam and ask about your symptoms. Chest X-rays or other tests may be done to rule out other problems. TREATMENT  Costochondritis usually goes away on its own over time. Your health care provider may prescribe medicine to help relieve pain. HOME CARE INSTRUCTIONS   Avoid exhausting physical activity. Try not to strain your ribs during normal activity. This would include any activities using chest, abdominal, and side muscles, especially if heavy weights are used.  Apply ice to the affected area for the first 2 days after the pain begins.  Put ice in a plastic bag.  Place a towel between your skin and the bag.  Leave the ice on for 20 minutes, 2-3 times a day.  Only take over-the-counter or prescription medicines as directed by your health care provider. SEEK MEDICAL CARE IF:  You have redness or swelling at the rib joints. These are signs of infection.  Your pain does not go away despite rest or medicine. SEEK IMMEDIATE MEDICAL CARE IF:   Your pain increases or you are very  uncomfortable.  You have shortness of breath or difficulty breathing.  You cough up blood.  You have worse chest pains, sweating, or vomiting.  You have a fever or persistent symptoms for more than 2-3 days.  You have a fever and your symptoms suddenly get worse. MAKE SURE YOU:   Understand these instructions.  Will watch your condition.  Will get help right away if you are not doing well or get worse.   This information is not intended to replace advice given to you by your health care provider. Make sure you discuss any questions you have with your health care provider.   Document Released: 05/21/2005 Document Revised: 06/01/2013 Document Reviewed: 03/15/2013 Elsevier Interactive Patient Education [employer]2016 Elsevier Inc.

## 2015-11-15 DIAGNOSIS — M940 Chondrocostal junction syndrome [Tietze]: Secondary | ICD-10-CM | POA: Insufficient documentation

## 2015-12-04 ENCOUNTER — Encounter (HOSPITAL_COMMUNITY): Payer: Self-pay | Admitting: *Deleted

## 2015-12-04 ENCOUNTER — Inpatient Hospital Stay (HOSPITAL_COMMUNITY)
Admission: AD | Admit: 2015-12-04 | Discharge: 2015-12-04 | Disposition: A | Discharge status: Home / Self Care | Payer: Medicaid Other | Source: Ambulatory Visit | Attending: Family Medicine | Admitting: Family Medicine

## 2015-12-04 DIAGNOSIS — A609 Anogenital herpesviral infection, unspecified: Secondary | ICD-10-CM

## 2015-12-04 DIAGNOSIS — L259 Unspecified contact dermatitis, unspecified cause: Secondary | ICD-10-CM | POA: Insufficient documentation

## 2015-12-04 DIAGNOSIS — B009 Herpesviral infection, unspecified: Secondary | ICD-10-CM | POA: Insufficient documentation

## 2015-12-04 DIAGNOSIS — I10 Essential (primary) hypertension: Secondary | ICD-10-CM | POA: Insufficient documentation

## 2015-12-04 DIAGNOSIS — A6000 Herpesviral infection of urogenital system, unspecified: Secondary | ICD-10-CM

## 2015-12-04 DIAGNOSIS — Z87891 Personal history of nicotine dependence: Secondary | ICD-10-CM | POA: Insufficient documentation

## 2015-12-04 DIAGNOSIS — Z79899 Other long term (current) drug therapy: Secondary | ICD-10-CM | POA: Insufficient documentation

## 2015-12-04 LAB — URINE MICROSCOPIC-ADD ON
BACTERIA UA: NONE SEEN
RBC / HPF: NONE SEEN RBC/hpf (ref 0–5)

## 2015-12-04 LAB — URINALYSIS, ROUTINE W REFLEX MICROSCOPIC
Bilirubin Urine: NEGATIVE
Glucose, UA: NEGATIVE mg/dL
Ketones, ur: NEGATIVE mg/dL
Leukocytes, UA: NEGATIVE
NITRITE: NEGATIVE
PH: 5.5 (ref 5.0–8.0)
Protein, ur: NEGATIVE mg/dL
SPECIFIC GRAVITY, URINE: 1.025 (ref 1.005–1.030)

## 2015-12-04 LAB — POCT PREGNANCY, URINE: Preg Test, Ur: NEGATIVE

## 2015-12-04 LAB — WET PREP, GENITAL
Clue Cells Wet Prep HPF POC: NONE SEEN
Sperm: NONE SEEN
TRICH WET PREP: NONE SEEN
YEAST WET PREP: NONE SEEN

## 2015-12-04 MED ORDER — VALACYCLOVIR HCL 500 MG PO TABS: 500.0000 mg | ORAL_TABLET | Freq: Two times a day (BID) | ORAL | Status: DC | PRN

## 2015-12-04 MED ORDER — CLOBETASOL PROPIONATE 0.05 % EX OINT: 1.0000 "application " | TOPICAL_OINTMENT | Freq: Two times a day (BID) | CUTANEOUS | Status: DC

## 2015-12-04 NOTE — MAU Note (Signed)
Reassumed care of patient to finish discharge charting (patient left MAU before reassuming care).

## 2015-12-04 NOTE — MAU Provider Note (Signed)
History     CSN: [id number]  Arrival date and time: 12/04/15 1037   First Provider Initiated Contact with Patient 12/04/15 1343      Chief Complaint  Patient presents with  . vag irritation    HPI   25 y/o G30P2012 female with history of HSV, chlamydia, gonorrhea presents to MAU for evaluation of vaginal irritation for the past 2 weeks. States there is mild vaginal itching with intermittent discomfort with urination. Denies any vaginal bleeding or discharge. Denies fevers, chills, night sweats, flank pain, hematuria. States she has recently changed her detergents and panty liners, states she has very sensitive skin and this has bothered her in the past.  OB History    Gravida Para Term Preterm AB TAB SAB Ectopic Multiple Living   0 2      Past Medical History  Diagnosis Date  . Chlamydia infection     POSITIVE 10/06,8/07,7/09,11/11 ;at age 50  . ASCUS on Pap smear     C&B 10/09 W/LGSIL  . HSV (herpes simplex virus) infection   . 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 Gonorrhea     POSITIVE 8/06, 10/06; at age 68  . GC (gonococcus infection)   . Trichomonal vulvovaginitis   . Ovarian cyst   . UTI (lower urinary tract infection)   . 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 H/O varicella   . History of high blood pressure   . Yeast infection   . H/O bacterial infection     frequently  . Abnormal Pap smear     completed  at age 21  . Asthma     as a child   . Angina ~2000    enlarged heart, pain due to stress  . Ovarian cyst   . H/O multiple allergies   . Vaginal Pap smear, abnormal   . Hypertension     not while rpegnant    Past Surgical History  Procedure Laterality Date  . Dilation and curettage of uterus      abortion  . Cesarean section  12/29/2011    Procedure: CESAREAN SECTION;  Surgeon: Purcell Nails, MD;  Location: [facility];  Service: Gynecology;  Laterality: N/A;  . Cesarean section  N/A 02/26/2015    Procedure: CESAREAN SECTION;  Surgeon: Osborn Coho, MD;  Location: [facility];  Service: Obstetrics;  Laterality: N/A;    Family History  Problem Relation Age of Onset  . Hypertension Mother   . Hypertension Father   . Hypertension Maternal Grandmother   . Stroke Maternal Grandmother   . Hypertension Maternal Grandfather   . Hypertension Maternal Aunt   . Hypertension Maternal Uncle   . Alcohol abuse Maternal Uncle   . Alcohol abuse Paternal Uncle     Social History  Substance Use Topics  . Smoking status: Former Games developer  . Smokeless tobacco: Never Used  . Alcohol Use: 0.6 oz/week    1 Shots of liquor per week     Comment: occasionally    Allergies: No Known Allergies  Prescriptions prior to admission  Medication Sig Dispense Refill Last Dose  . ibuprofen (ADVIL,MOTRIN) 800 MG tablet Take 1 tablet (800 mg total) by mouth 2 (two) times daily. Take for 2 weeks. 28 tablet 0 12/03/2015 at Unknown time  . valACYclovir (VALTREX) 500 MG tablet Take 500 mg by mouth  2 (two) times daily as needed (for breakouts).   12/04/2015 at Unknown time  . [DISCONTINUED] ibuprofen (ADVIL,MOTRIN) 600 MG tablet Take 1 tablet (600 mg total) by mouth every 6 (six) hours as needed. (Patient not taking: Reported on 12/04/2015) 30 tablet 2 Taking  . [DISCONTINUED] oxyCODONE-acetaminophen (PERCOCET/ROXICET) 5-325 MG per tablet Take 1 tablet by mouth every 4 (four) hours as needed (for pain scale 4-7). (Patient not taking: Reported on 12/04/2015) 30 tablet 0 Taking    Review of Systems  Constitutional: Negative for fever and chills.  HENT: Negative for congestion.   Eyes: Negative for blurred vision.  Respiratory: Negative for cough and hemoptysis.   Cardiovascular: Negative for chest pain and palpitations.  Gastrointestinal: Negative for nausea, vomiting, abdominal pain, blood in stool and melena.  Genitourinary: Negative for urgency, frequency, hematuria and flank pain.  Musculoskeletal:  Negative for myalgias.  Skin: Negative for rash.  Neurological: Negative for headaches.  Endo/Heme/Allergies: Negative for environmental allergies. Does not bruise/bleed easily.   Physical Exam   Blood pressure 135/89, pulse 101, temperature 98.2 F (36.8 C), temperature source Oral, resp. rate 18, height 5\' 1"  (1.549 m), weight 73.664 kg (162 lb 6.4 oz), unknown if currently breastfeeding.  Physical Exam  Constitutional: She is oriented to person, place, and time. She appears well-developed and well-nourished.  HENT:  Head: Normocephalic and atraumatic.  Eyes: Pupils are equal, round, and reactive to light.  Neck: Normal range of motion. Neck supple.  Cardiovascular: Normal rate and regular rhythm.   Respiratory: Effort normal and breath sounds normal.  GI: Soft. Bowel sounds are normal.  Genitourinary: Vagina normal and uterus normal. No vaginal discharge found.  Neurological: She is alert and oriented to person, place, and time.  Skin: Skin is warm and dry. No rash noted. No erythema. No pallor.  Psychiatric: She has a normal mood and affect. Her behavior is normal. Judgment and thought content normal.    MAU Course  Procedures  MDM   Assessment and Plan   Contact dermatitis, likely from new detergent/panty liners/towels. Discussed this with patient, she intends to discontinue possible offending agents.   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 Given prescription with refills., with instructions to take 500 mg twice daily for three days if outbreak occurs. Patient is in agreement with this plan, has no other questions or concerns at this time.   Jolana Runkles H [place] 12/04/2015, 2:34 PM

## 2015-12-04 NOTE — MAU Note (Signed)
Vaginal irritaion, for past 2 wks. No discharge, no discharge.  Having itching,some burning when she urinates, but not all the time. Does have new towels, washed them- but noted they are still losing dye.

## 2015-12-04 NOTE — MAU Provider Note (Signed)
Chief Complaint: vag irritation    First Provider Initiated Contact with Patient 12/04/15 1343      SUBJECTIVE HPI: Kara Fernandez is a 25 y.o. [id number] who presents to maternity admissions reporting onset of vaginal irritation 2 weeks ago with itching starting 2 days ago. She has history of recurrent genital HSV and reports the symptoms are similar to previous prodromal symptoms but they do not usually last this long. She also reported switching laundry detergents to a new one with lavender and reports changing to a new brand of panty liner recently.  She has not tried any treatments and reports the symptoms are worsening. She denies vaginal bleeding, urinary symptoms, h/a, dizziness, n/v, or fever/chills.     HPI  Past Medical History  Diagnosis Date  . Chlamydia infection     POSITIVE 10/06,8/07,7/09,11/11 ;at age 25  . ASCUS on Pap smear     C&B 10/09 W/LGSIL  . HSV (herpes simplex virus) infection   . 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 Gonorrhea     POSITIVE 8/06, 10/06; at age 25  . GC (gonococcus infection)   . Trichomonal vulvovaginitis   . Ovarian cyst   . UTI (lower urinary tract infection)   . 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 H/O varicella   . History of high blood pressure   . Yeast infection   . H/O bacterial infection     frequently  . Abnormal Pap smear     completed  at age 25  . Asthma     as a child   . Angina ~2000    enlarged heart, pain due to stress  . Ovarian cyst   . H/O multiple allergies   . Vaginal Pap smear, abnormal   . Hypertension     not while rpegnant   Past Surgical History  Procedure Laterality Date  . Dilation and curettage of uterus      abortion  . Cesarean section  12/29/2011    Procedure: CESAREAN SECTION;  Surgeon: Purcell NailsAngela Y Roberts, MD;  Location: [facility];  Service: Gynecology;  Laterality: N/A;  . Cesarean section N/A 02/26/2015    Procedure: CESAREAN SECTION;   Surgeon: Osborn CohoAngela Roberts, MD;  Location: [facility];  Service: Obstetrics;  Laterality: N/A;   Social History   Social History  . Marital Status: Single    Spouse Name: N/A  . Number of Children: N/A  . Years of Education: N/A   Occupational History  . Not on file.   Social History Main Topics  . Smoking status: Former Games developermoker  . Smokeless tobacco: Never Used  . Alcohol Use: 0.6 oz/week    1 Shots of liquor per week     Comment: occasionally  . Drug Use: Yes    Special: Marijuana     Comment: 08/24/13  . Sexual Activity: Yes    Birth Control/ Protection: None     Comment: last sex Jun 29 2014   Other Topics Concern  . Not on file   Social History Narrative   No current facility-administered medications on file prior to encounter.   Current Outpatient Prescriptions on File Prior to Encounter  Medication Sig Dispense Refill  . ibuprofen (ADVIL,MOTRIN) 800 MG tablet Take 1 tablet (800 mg total) by mouth 2 (two) times daily. Take for 2 weeks. 28 tablet 0   No Known Allergies  ROS:  Review of Systems  Constitutional: Negative for fever, chills and fatigue.  Respiratory: Negative for shortness of breath.   Cardiovascular: Negative for chest pain.  Gastrointestinal: Negative for nausea and vomiting.  Genitourinary: Positive for vaginal discharge and vaginal pain. Negative for dysuria, flank pain, vaginal bleeding, difficulty urinating and pelvic pain.  Neurological: Negative for dizziness and headaches.  Psychiatric/Behavioral: Negative.      I have reviewed patient's Past Medical Hx, Surgical Hx, Family Hx, Social Hx, medications and allergies.   Physical Exam   Patient Vitals for the past 24 hrs:  BP Temp Temp src Pulse Resp Height Weight  12/04/15 1054 135/89 mmHg 98.2 F (36.8 C) Oral 101 18  (1.549 m) 162 lb 6.4 oz (73.664 kg)   Constitutional: Well-developed, well-nourished female in no acute distress.  Cardiovascular: normal rate Respiratory: normal  effort GI: Abd soft, non-tender. Pos BS x 4 MS: Extremities nontender, no edema, normal ROM Neurologic: Alert and oriented x 4.  GU: Neg CVAT.  PELVIC EXAM: On visual inspection, mild erythema of labia, introitus, no lesions noted.  Wet prep and GCC collected by blind swab.   LAB RESULTS Results for orders placed or performed during the hospital encounter of 12/04/15 (from the past 24 hour(s))  Urinalysis, Routine w reflex microscopic (not at [facility])     Status: Abnormal   Collection Time: 12/04/15 11:00 AM  Result Value Ref Range   Color, Urine YELLOW YELLOW   APPearance CLEAR CLEAR   Specific Gravity, Urine 1.025 1.005 - 1.030   pH 5.5 5.0 - 8.0   Glucose, UA NEGATIVE NEGATIVE mg/dL   Hgb urine dipstick TRACE (A) NEGATIVE   Bilirubin Urine NEGATIVE NEGATIVE   Ketones, ur NEGATIVE NEGATIVE mg/dL   Protein, ur NEGATIVE NEGATIVE mg/dL   Nitrite NEGATIVE NEGATIVE   Leukocytes, UA NEGATIVE NEGATIVE  Urine microscopic-add on     Status: Abnormal   Collection Time: 12/04/15 11:00 AM  Result Value Ref Range   Squamous Epithelial / LPF 0-5 (A) NONE SEEN   WBC, UA 0-5 0 - 5 WBC/hpf   RBC / HPF NONE SEEN 0 - 5 RBC/hpf   Bacteria, UA NONE SEEN NONE SEEN  Pregnancy, urine POC     Status: None   Collection Time: 12/04/15 11:19 AM  Result Value Ref Range   Preg Test, Ur NEGATIVE NEGATIVE  Wet prep, genital     Status: Abnormal   Collection Time: 12/04/15  2:30 PM  Result Value Ref Range   Yeast Wet Prep HPF POC NONE SEEN NONE SEEN   Trich, Wet Prep NONE SEEN NONE SEEN   Clue Cells Wet Prep HPF POC NONE SEEN NONE SEEN   WBC, Wet Prep HPF POC MODERATE (A) NONE SEEN   Sperm NONE SEEN     --/--/B POS (07/04 0400)  IMAGING  MAU Management/MDM: Ordered labs and reviewed results.  No evidence of vaginal infection today.  Will renew pt Rx for Valtrex to treat recurrent outbreaks but treat this irritation as contact dermatitis with topical steroid.  Clobetasol 0.05% ointment Rx to  pharmacy. Pt to f/u in [facility] for Gyn care and return to MAU for emergencies. Pt stable at time of discharge.  ASSESSMENT 1. Contact dermatitis   2. Recurrent genital herpes     PLAN Discharge home Valtrex 500 mg BID x 3 days with refills Clobetasol 0.05% ointment BID PRN    Medication List    TAKE these medications        clobetasol  ointment 0.05 %  Commonly known as:  TEMOVATE  Apply 1 application topically 2 (two) times daily.     ibuprofen 800 MG tablet  Commonly known as:  ADVIL,MOTRIN  Take 1 tablet (800 mg total) by mouth 2 (two) times daily. Take for 2 weeks.     valACYclovir 500 MG tablet  Commonly known as:  VALTREX  Take 1 tablet (500 mg total) by mouth 2 (two) times daily as needed (for breakouts).       Follow-up Information    Follow up with [facility].   Specialty:  Obstetrics and Gynecology   Why:  As needed, Return to MAU as needed for emergencies   Contact information:   [address] [place] [address] [phone]      Sharen Counter Certified Nurse-Midwife 12/04/2015  3:32 PM

## 2015-12-04 NOTE — Discharge Instructions (Signed)
Contact Dermatitis Dermatitis is redness, soreness, and swelling (inflammation) of the skin. Contact dermatitis is a reaction to certain substances that touch the skin. There are two types of contact dermatitis:   Irritant contact dermatitis. This type is caused by something that irritates your skin, such as dry hands from washing them too much. This type does not require previous exposure to the substance for a reaction to occur. This type is more common.  Allergic contact dermatitis. This type is caused by a substance that you are allergic to, such as a nickel allergy or poison ivy. This type only occurs if you have been exposed to the substance (allergen) before. Upon a repeat exposure, your body reacts to the substance. This type is less common. CAUSES  Many different substances can cause contact dermatitis. Irritant contact dermatitis is most commonly caused by exposure to:   Makeup.   Soaps.   Detergents.   Bleaches.   Acids.   Metal salts, such as nickel.  Allergic contact dermatitis is most commonly caused by exposure to:   Poisonous plants.   Chemicals.   Jewelry.   Latex.   Medicines.   Preservatives in products, such as clothing.  RISK FACTORS This condition is more likely to develop in:   People who have jobs that expose them to irritants or allergens.  People who have certain medical conditions, such as asthma or eczema.  SYMPTOMS  Symptoms of this condition may occur anywhere on your body where the irritant has touched you or is touched by you. Symptoms include:  Dryness or flaking.   Redness.   Cracks.   Itching.   Pain or a burning feeling.   Blisters.  Drainage of small amounts of blood or clear fluid from skin cracks. With allergic contact dermatitis, there may also be swelling in areas such as the eyelids, mouth, or genitals.  DIAGNOSIS  This condition is diagnosed with a medical history and physical exam. A patch skin test  may be performed to help determine the cause. If the condition is related to your job, you may need to see an occupational medicine specialist. TREATMENT Treatment for this condition includes figuring out what caused the reaction and protecting your skin from further contact. Treatment may also include:   Steroid creams or ointments. Oral steroid medicines may be needed in more severe cases.  Antibiotics or antibacterial ointments, if a skin infection is present.  Antihistamine lotion or an antihistamine taken by mouth to ease itching.  A bandage (dressing). HOME CARE INSTRUCTIONS Skin Care  Moisturize your skin as needed.   Apply cool compresses to the affected areas.  Try taking a bath with:  Epsom salts. Follow the instructions on the packaging. You can get these at your local pharmacy or grocery store.  Baking soda. Pour a small amount into the bath as directed by your health care provider.  Colloidal oatmeal. Follow the instructions on the packaging. You can get this at your local pharmacy or grocery store.  Try applying baking soda paste to your skin. Stir water into baking soda until it reaches a paste-like consistency.  Do not scratch your skin.  Bathe less frequently, such as every other day.  Bathe in lukewarm water. Avoid using hot water. Medicines  Take or apply over-the-counter and prescription medicines only as told by your health care provider.   If you were prescribed an antibiotic medicine, take or apply your antibiotic as told by your health care provider. Do not stop using the   antibiotic even if your condition starts to improve. General Instructions  Keep all follow-up visits as told by your health care provider. This is important.  Avoid the substance that caused your reaction. If you do not know what caused it, keep a journal to try to track what caused it. Write down:  What you eat.  What cosmetic products you use.  What you drink.  What  you wear in the affected area. This includes jewelry.  If you were given a dressing, take care of it as told by your health care provider. This includes when to change and remove it. SEEK MEDICAL CARE IF:   Your condition does not improve with treatment.  Your condition gets worse.  You have signs of infection such as swelling, tenderness, redness, soreness, or warmth in the affected area.  You have a fever.  You have new symptoms. SEEK IMMEDIATE MEDICAL CARE IF:   You have a severe headache, neck pain, or neck stiffness.  You vomit.  You feel very sleepy.  You notice red streaks coming from the affected area.  Your bone or joint underneath the affected area becomes painful after the skin has healed.  The affected area turns darker.  You have difficulty breathing.   This information is not intended to replace advice given to you by your health care provider. Make sure you discuss any questions you have with your health care provider.   Document Released: 08/08/2000 Document Revised: 05/02/2015 Document Reviewed: 12/27/2014 Elsevier Interactive Patient Education 2016 [employer]  

## 2015-12-05 LAB — RPR: RPR Ser Ql: NONREACTIVE

## 2015-12-05 LAB — GC/CHLAMYDIA PROBE AMP (~~LOC~~) NOT AT ARMC
Chlamydia: NEGATIVE
Neisseria Gonorrhea: NEGATIVE

## 2015-12-05 LAB — HIV ANTIBODY (ROUTINE TESTING W REFLEX): HIV Screen 4th Generation wRfx: NONREACTIVE

## 2016-08-07 ENCOUNTER — Encounter (HOSPITAL_COMMUNITY): Payer: Self-pay | Admitting: Emergency Medicine

## 2016-08-07 ENCOUNTER — Emergency Department (HOSPITAL_COMMUNITY)
Admission: EM | Admit: 2016-08-07 | Discharge: 2016-08-07 | Disposition: A | Discharge status: Home / Self Care | Payer: Medicaid Other | Attending: Emergency Medicine | Admitting: Emergency Medicine

## 2016-08-07 DIAGNOSIS — Z87891 Personal history of nicotine dependence: Secondary | ICD-10-CM | POA: Insufficient documentation

## 2016-08-07 DIAGNOSIS — R1013 Epigastric pain: Secondary | ICD-10-CM

## 2016-08-07 DIAGNOSIS — I10 Essential (primary) hypertension: Secondary | ICD-10-CM | POA: Insufficient documentation

## 2016-08-07 DIAGNOSIS — J45909 Unspecified asthma, uncomplicated: Secondary | ICD-10-CM | POA: Insufficient documentation

## 2016-08-07 DIAGNOSIS — R112 Nausea with vomiting, unspecified: Secondary | ICD-10-CM

## 2016-08-07 LAB — COMPREHENSIVE METABOLIC PANEL
ALT: 13 U/L — ABNORMAL LOW (ref 14–54)
ANION GAP: 9 (ref 5–15)
AST: 26 U/L (ref 15–41)
Albumin: 3.9 g/dL (ref 3.5–5.0)
Alkaline Phosphatase: 81 U/L (ref 38–126)
BUN: 7 mg/dL (ref 6–20)
CHLORIDE: 108 mmol/L (ref 101–111)
CO2: 20 mmol/L — ABNORMAL LOW (ref 22–32)
CREATININE: 0.73 mg/dL (ref 0.44–1.00)
Calcium: 8.8 mg/dL — ABNORMAL LOW (ref 8.9–10.3)
Glucose, Bld: 86 mg/dL (ref 65–99)
POTASSIUM: 4.7 mmol/L (ref 3.5–5.1)
Sodium: 137 mmol/L (ref 135–145)
Total Bilirubin: 0.9 mg/dL (ref 0.3–1.2)
Total Protein: 7.5 g/dL (ref 6.5–8.1)

## 2016-08-07 LAB — URINALYSIS, ROUTINE W REFLEX MICROSCOPIC
BILIRUBIN URINE: NEGATIVE
Glucose, UA: NEGATIVE mg/dL
HGB URINE DIPSTICK: NEGATIVE
KETONES UR: NEGATIVE mg/dL
Leukocytes, UA: NEGATIVE
Nitrite: NEGATIVE
PROTEIN: NEGATIVE mg/dL
Specific Gravity, Urine: 1.024 (ref 1.005–1.030)
pH: 5 (ref 5.0–8.0)

## 2016-08-07 LAB — CBC
HEMATOCRIT: 42.3 % (ref 36.0–46.0)
Hemoglobin: 14 g/dL (ref 12.0–15.0)
MCH: 22.5 pg — ABNORMAL LOW (ref 26.0–34.0)
MCHC: 33.1 g/dL (ref 30.0–36.0)
MCV: 68 fL — AB (ref 78.0–100.0)
PLATELETS: 290 10*3/uL (ref 150–400)
RBC: 6.22 MIL/uL — AB (ref 3.87–5.11)
RDW: 15.4 % (ref 11.5–15.5)
WBC: 9 10*3/uL (ref 4.0–10.5)

## 2016-08-07 LAB — POC URINE PREG, ED: PREG TEST UR: NEGATIVE

## 2016-08-07 LAB — LIPASE, BLOOD: LIPASE: 21 U/L (ref 11–51)

## 2016-08-07 MED ORDER — GI COCKTAIL ~~LOC~~
30.0000 mL | Freq: Once | ORAL | Status: AC
Start: 2016-08-07 — End: 2016-08-07
  Administered 2016-08-07: 30 mL via ORAL
  Filled 2016-08-07: qty 30

## 2016-08-07 MED ORDER — ONDANSETRON HCL 4 MG/2ML IJ SOLN
4.0000 mg | Freq: Once | INTRAMUSCULAR | Status: AC
  Administered 2016-08-07: 4 mg via INTRAVENOUS
  Filled 2016-08-07: qty 2

## 2016-08-07 MED ORDER — ONDANSETRON HCL 4 MG PO TABS: 4.0000 mg | ORAL_TABLET | Freq: Three times a day (TID) | ORAL | 0 refills | Status: AC | PRN

## 2016-08-07 MED ORDER — OMEPRAZOLE 20 MG PO CPDR: 20.0000 mg | DELAYED_RELEASE_CAPSULE | Freq: Every day | ORAL | 0 refills | Status: DC

## 2016-08-07 MED ORDER — SODIUM CHLORIDE 0.9 % IV BOLUS (SEPSIS)
500.0000 mL | Freq: Once | INTRAVENOUS | Status: AC
  Administered 2016-08-07: 500 mL via INTRAVENOUS

## 2016-08-07 NOTE — ED Triage Notes (Signed)
Pt sts N/V/D and abd pain starting last night

## 2016-08-07 NOTE — ED Triage Notes (Signed)
Urine culture not ordered.

## 2016-08-07 NOTE — ED Provider Notes (Addendum)
[facility]-EMERGENCY DEPT Provider Note   CSN: [id number] Arrival date & time: 08/07/16  1138  By signing my name below, I, Freida Busmaniana Omoyeni, attest that this documentation has been prepared under the direction and in the presence of Nira ConnPedro Eduardo Josemiguel Gries, MD . Electronically Signed: Freida Busmaniana Omoyeni, Scribe. 08/07/2016. 12:32 PM.  History   Chief Complaint Chief Complaint  Patient presents with  . Emesis    The history is provided by the patient. No language interpreter was used.    HPI Comments:  Kara Fernandez is a 25 y.o. female who presents to the Emergency Department complaining of epigastric abdominal pain x 3 days. She describes her pain as intermittently throbbing, and occasionally sharp. She notes the pain woke her from her sleep and was constant all day yesterday. She admits to eating acidic and greasy foods prior to symptom onset. Pt reports moderate relief from burping and passing gas. Pt also notes associated decreased appetite since yesterday as well as vomiting. She reports 2 large episodes of vomiting and a 3rd smaller episode. Her emesis was non-bilious. She notes streaky bloody in the first episode but no blood noted after that. She has taken tums without relief as well as extra strength tylenol.The tums has worked in the past.Pt has a h/o GERD in the past but states only in pregnancy. She states she is not currently pregnant as she is not sexually active. She denies lower abdominal pain, fever,  acute CP, SOB, diarrhea, bright red blood in her stool and melena.   Past Medical History:  Diagnosis Date  . Abnormal Pap smear    completed  at age 89  . Angina ~2000   enlarged heart, pain due to stress  . ASCUS on Pap smear    C&B 10/09 W/LGSIL  . Asthma    as a child   . Chlamydia infection    POSITIVE 10/06,8/07,7/09,11/11 ;at age 25  . GC (gonococcus infection)   . Gonorrhea    POSITIVE 8/06, 10/06; at age 25  . H/O bacterial infection    frequently  . H/O multiple  allergies   . H/O varicella   . History of high blood pressure   . 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 HSV (herpes simplex virus) infection   . 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 Hypertension    not while rpegnant  . Ovarian cyst   . Ovarian cyst   . Trichomonal vulvovaginitis   . UTI (lower urinary tract infection)   . Vaginal Pap smear, abnormal   . Yeast infection     Patient Active Problem List   Diagnosis Date Noted  . Costochondritis 11/15/2015  . Previous cesarean section 02/27/2015  . [facility] post [facility]  . Vaginal bleeding in pregnancy 07/10/2014  . Fetal heart rate decelerations affecting management of mother 12/30/2011  . History of chlamydia 12/29/2011  . History of gonorrhea 12/29/2011  . History of asthma - childhood 12/29/2011  . Anemia 12/29/2011  . 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 CESAREAN SECTION  12/29/2011   Procedure: CESAREAN SECTION;  Surgeon: Purcell NailsAngela Y Roberts, MD;  Location: [facility];  Service: Gynecology;  Laterality: N/A;  . CESAREAN SECTION N/A 02/26/2015   Procedure: CESAREAN SECTION;  Surgeon: Osborn CohoAngela Roberts, MD;  Location: [facility];  Service: Obstetrics;  Laterality: N/A;  . DILATION AND CURETTAGE OF UTERUS     abortion    OB History    Gravida Para Term Preterm AB Living   3 2 2   1 2    SAB TAB Ectopic Multiple Live Births     1   0 2       Home Medications    Prior to Admission medications   Medication Sig Start Date End Date Taking? Authorizing Provider  clobetasol ointment (TEMOVATE) 0.05 % Apply 1 application topically 2 (two) times daily. 12/04/15   Lisa A Leftwich-Kirby, CNM  ibuprofen (ADVIL,MOTRIN) 800 MG tablet Take 1 tablet (800 mg total) by mouth 2 (two) times daily. Take for 2 weeks. 11/14/15   Chrystie Nose, MD  omeprazole (PRILOSEC) 20 MG capsule  Take 1 capsule (20 mg total) by mouth daily. 08/07/16 09/06/16  Nira Conn, MD  ondansetron (ZOFRAN) 4 MG tablet Take 1 tablet (4 mg total) by mouth every 8 (eight) hours as needed for nausea or vomiting. 08/07/16 08/10/16  Nira Conn, MD  valACYclovir (VALTREX) 500 MG tablet Take 1 tablet (500 mg total) by mouth 2 (two) times daily as needed (for breakouts). 12/04/15   Hurshel Party, CNM    Family History Family History  Problem Relation Age of Onset  . Hypertension Mother   . Hypertension Father   . Hypertension Maternal Grandmother   . Stroke Maternal Grandmother   . Hypertension Maternal Grandfather   . Hypertension Maternal Aunt   . Hypertension Maternal Uncle   . Alcohol abuse Maternal Uncle   . Alcohol abuse Paternal Uncle     Social History Social History  Substance Use Topics  . Smoking status: Former Games developer  . Smokeless tobacco: Never Used  . Alcohol use 0.6 oz/week    1 Shots of liquor per week     Comment: occasionally     Allergies   Patient has no known allergies.   Review of Systems Review of Systems  10 systems reviewed and all are negative for acute change except as noted in the HPI.  Physical Exam Updated Vital Signs BP 124/79   Pulse 88   Temp 98.1 F (36.7 C) (Oral)   Resp 16   SpO2 100%   Physical Exam  Constitutional: She is oriented to person, place, and time. She appears well-developed and well-nourished. No distress.  HENT:  Head: Normocephalic and atraumatic.  Nose: Nose normal.  Eyes: Conjunctivae and EOM are normal. Pupils are equal, round, and reactive to light. Right eye exhibits no discharge. Left eye exhibits no discharge. No scleral icterus.  Neck: Normal range of motion. Neck supple.  Cardiovascular: Normal rate and regular rhythm.  Exam reveals no gallop and no friction rub.   No murmur heard. Pulmonary/Chest: Effort normal and breath sounds normal. No stridor. No respiratory distress. She has no  rales.  Abdominal: Soft. She exhibits no distension. There is no tenderness.  Epigastric and LUQ discomfort; no tenderness  Musculoskeletal: She exhibits no edema or tenderness.  Neurological: She is alert and oriented to person, place, and time.  Skin: Skin is warm and dry. No rash noted. She is not diaphoretic. No erythema.  Psychiatric: She has a normal mood and affect.  Nursing note and vitals reviewed.    ED Treatments / Results  DIAGNOSTIC STUDIES:  Oxygen Saturation is 100% on RA, normal by my interpretation.    COORDINATION OF CARE:  12:24 PM Will administer GI cocktail. Discussed treatment  plan with pt at bedside and pt agreed to plan.  Labs (all labs ordered are listed, but only abnormal results are displayed) Labs Reviewed  URINALYSIS, ROUTINE W REFLEX MICROSCOPIC - Abnormal; Notable for the following:       Result Value   APPearance HAZY (*)    All other components within normal limits  COMPREHENSIVE METABOLIC PANEL - Abnormal; Notable for the following:    CO2 20 (*)    Calcium 8.8 (*)    ALT 13 (*)    All other components within normal limits  CBC - Abnormal; Notable for the following:    RBC 6.22 (*)    MCV 68.0 (*)    MCH 22.5 (*)    All other components within normal limits  URINE CULTURE  LIPASE, BLOOD  POC URINE PREG, ED    EKG  EKG Interpretation None       Radiology No results found.  Procedures Procedures (including critical care time)  Medications Ordered in ED Medications  gi cocktail (Maalox,Lidocaine,Donnatal) (30 mLs Oral Given 08/07/16 1250)  ondansetron (ZOFRAN) injection 4 mg (4 mg Intravenous Given 08/07/16 1250)  sodium chloride 0.9 % bolus 500 mL (500 mLs Intravenous New Bag/Given 08/07/16 1250)     Initial Impression / Assessment and Plan / ED Course  I have reviewed the triage vital signs and the nursing notes.  Pertinent labs & imaging results that were available during my care of the patient were reviewed by me and  considered in my medical decision making (see chart for details).  Clinical Course     Presentation most consistent with gastritis with gaseous colic. Labs obtained to rule out biliary obstruction and/or pancreatitis. No suspicion for cardiac or pulmonary etiology of pt's presentation. Low suspicion for serious intra-abdominal inflammation or infection such as acute cholecystitis, appendicitis, diverticulitis, colitis. Doubt SBO.   Labs reassuring.  Given antiemetics, IVF, GI cocktail and was able to tolerate PO hydration.  The patient is safe for discharge with strict return precautions.   Final Clinical Impressions(s) / ED Diagnoses   Final diagnoses:  Epigastric pain  Non-intractable vomiting with nausea, unspecified vomiting type   Disposition: Discharge  Condition: Good  I have discussed the results, Dx and Tx plan with the patient who expressed understanding and agree(s) with the plan. Discharge instructions discussed at great length. The patient was given strict return precautions who verbalized understanding of the instructions. No further questions at time of discharge.    New Prescriptions   OMEPRAZOLE (PRILOSEC) 20 MG CAPSULE    Take 1 capsule (20 mg total) by mouth daily.   ONDANSETRON (ZOFRAN) 4 MG TABLET    Take 1 tablet (4 mg total) by mouth every 8 (eight) hours as needed for nausea or vomiting.    Follow Up: Jaymes GraffNaima Dillard, MD [address] [place] [id number] [phone]  Schedule an appointment as soon as possible for a visit  in 5-7 days, If symptoms do not improve or  worsen   I personally performed the services described in this documentation, which was scribed in my presence. The recorded information has been reviewed and is accurate.        Nira ConnPedro Eduardo Genesis Novosad, MD 08/07/16 [phone]

## 2016-08-07 NOTE — ED Triage Notes (Signed)
Pt drinking a soda  

## 2016-08-08 LAB — URINE CULTURE

## 2016-09-08 IMAGING — US US OB COMP LESS 14 WK
1 series · 14 of 28 positions shown · non-contrast
Comparison: None.

CLINICAL DATA: Abdominal pain. Pregnant patient. Beta HCG level
387. Patient also complaining of back pain.

EXAM:
OBSTETRIC <14 WK US AND TRANSVAGINAL OB US
TECHNIQUE: Both transabdominal and transvaginal ultrasound examinations were
performed for complete evaluation of the gestation as well as the
maternal uterus, adnexal regions, and pelvic cul-de-sac.
Transvaginal technique was performed to assess early pregnancy.

[Series 1: us ob comp less 14 wk · 0.16mm/px · 14 of 38 slices shown]
[im 2/38]
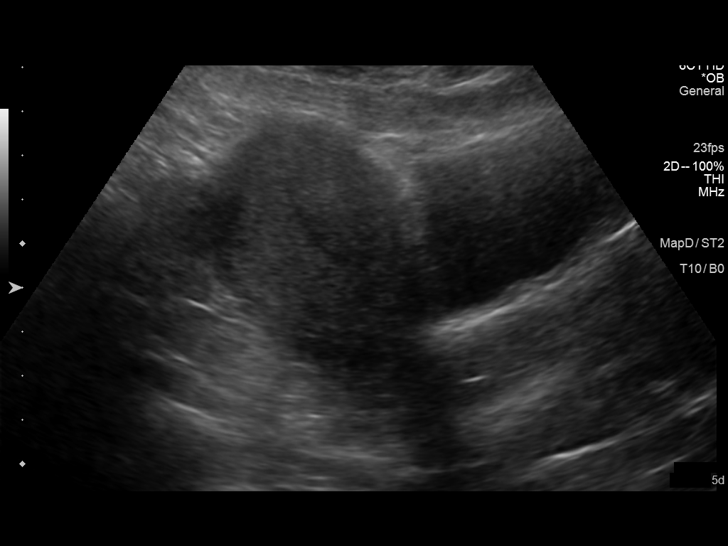
[im 5/38]
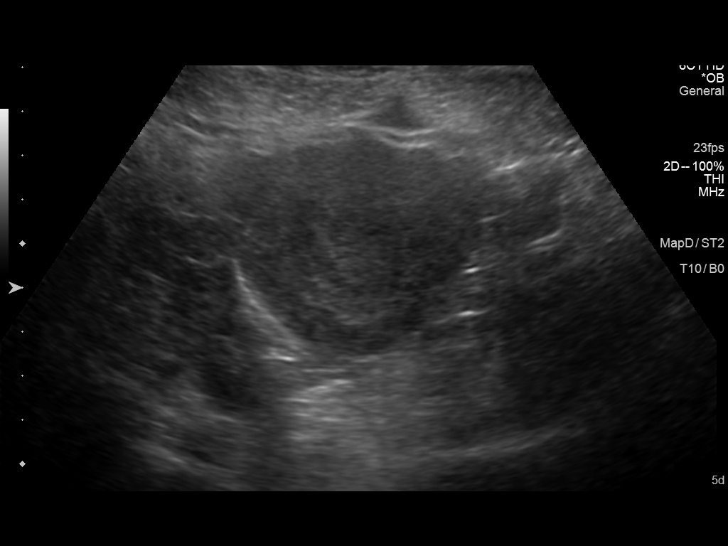
[im 7/38]
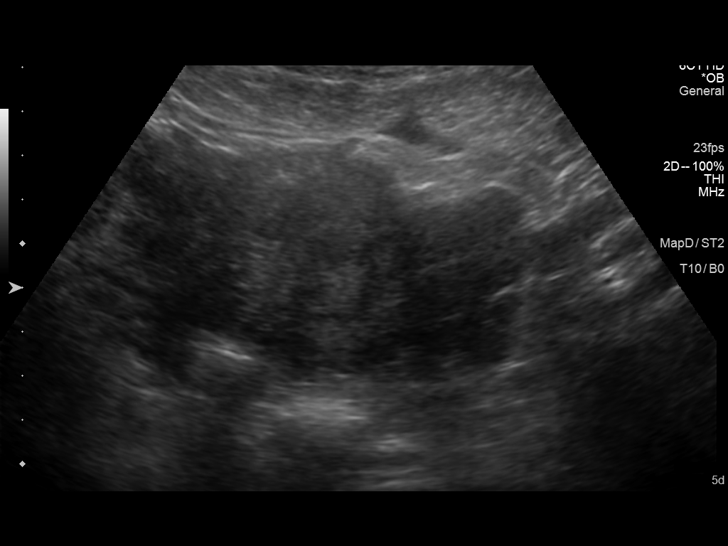
[im 10/38]
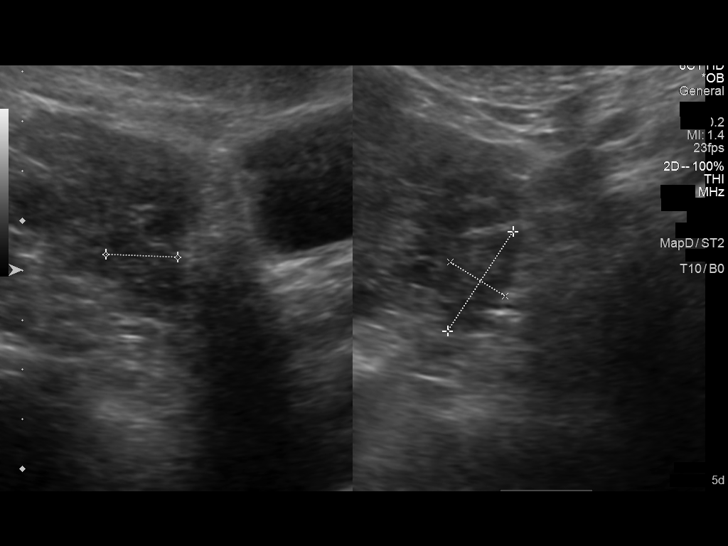
[im 13/38]
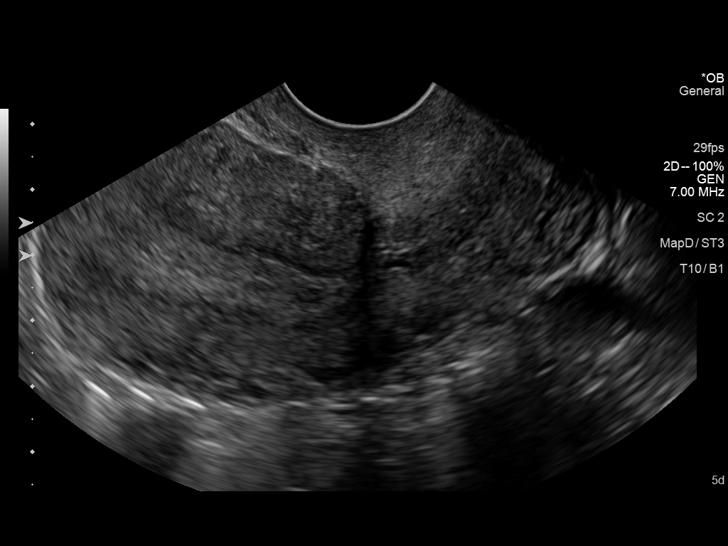
[im 16/38]
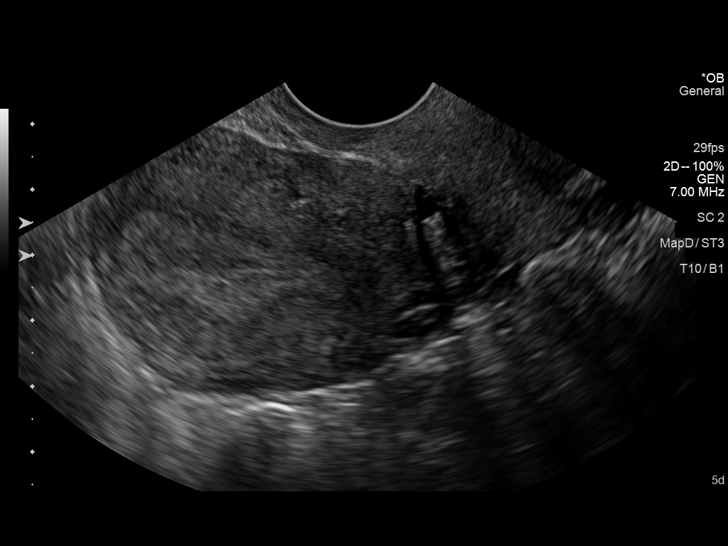
[im 18/38]
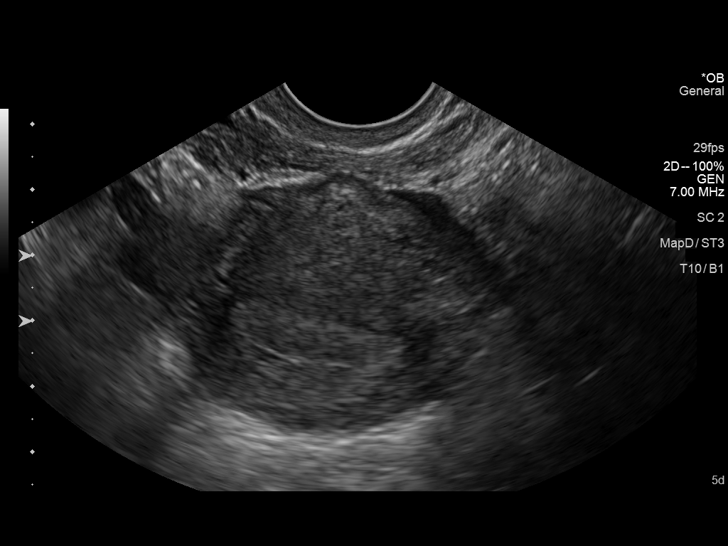
[im 21/38]
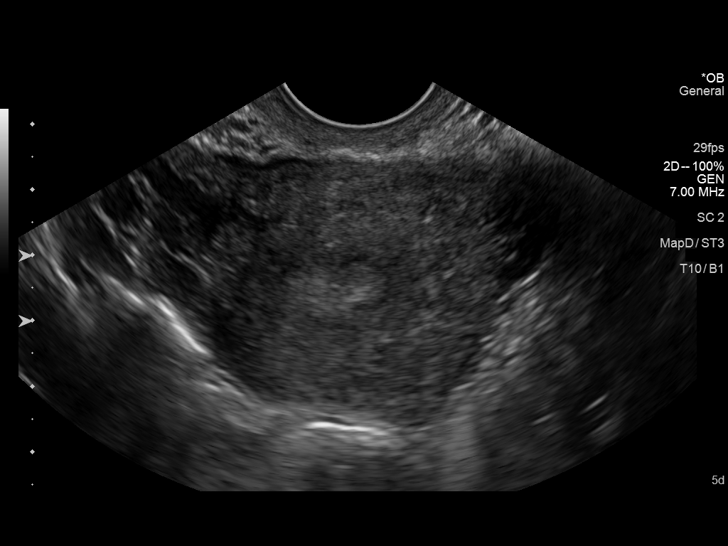
[im 24/38]
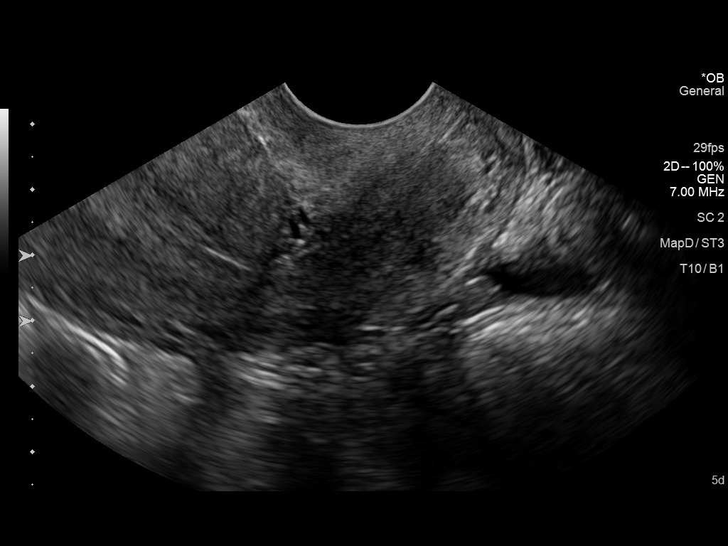
[im 27/38]
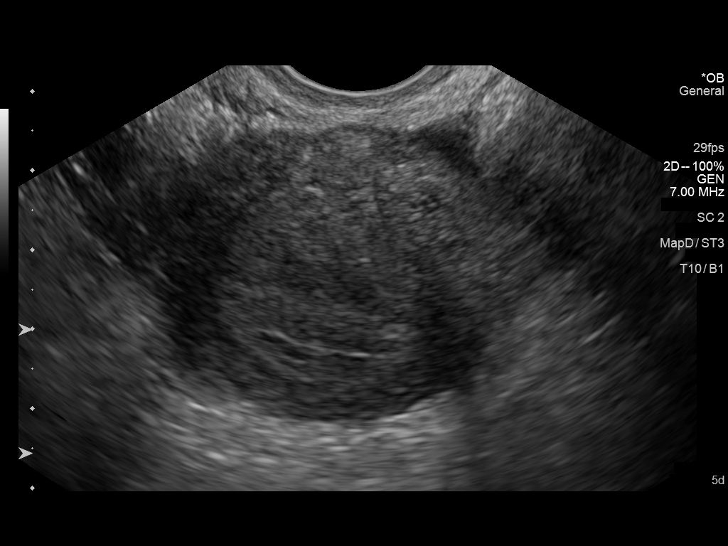
[im 29/38]
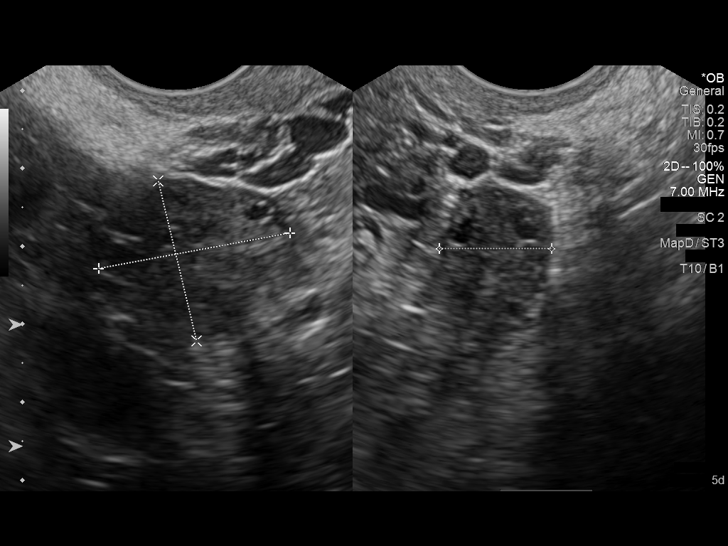
[im 32/38]
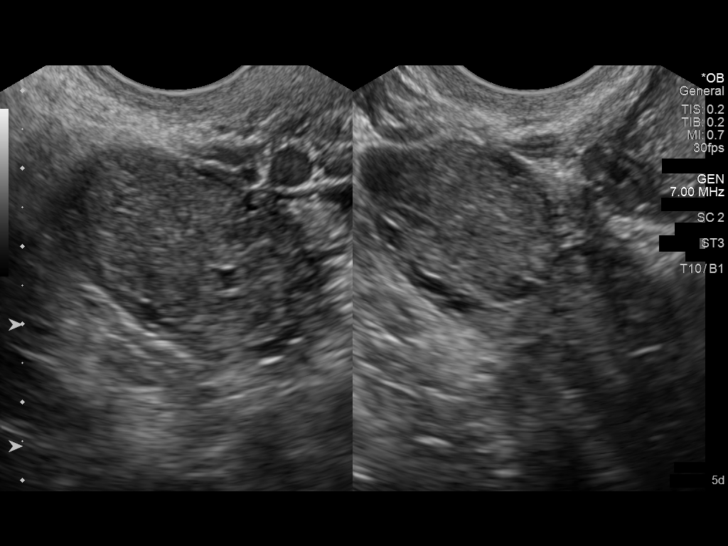
[im 35/38]
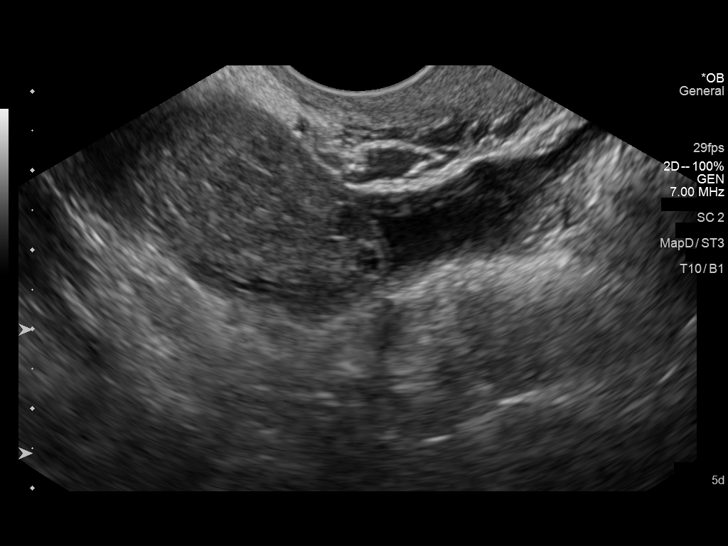
[im 38/38]
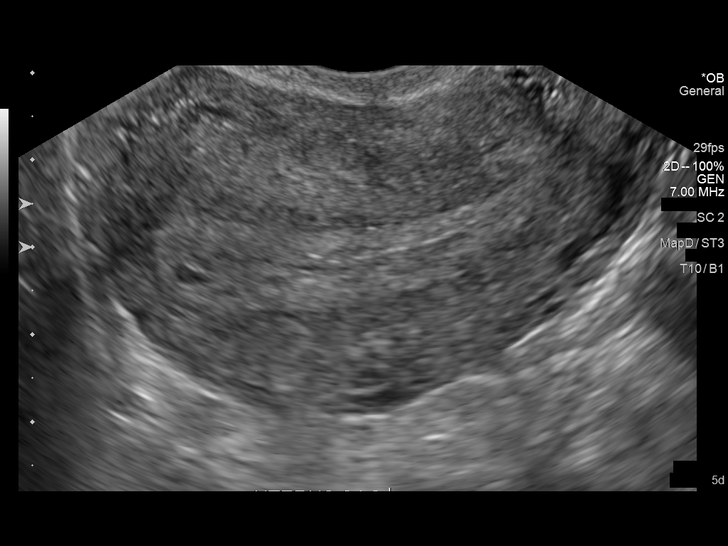

[14 of 28 positions shown; findings below may reference images not displayed]

FINDINGS: Intrauterine gestational sac: Not visualized

Yolk sac:  None

Embryo:  None

Uterus: No uterine masses. Endometrium mildly prominent measuring 9
mm in thickness. Cervix unremarkable.

Adnexum: Normal ovaries. No adnexal masses. Trace, likely
physiologic, pelvic free fluid.
IMPRESSION: 1. Normal exam. No evidence of an intrauterine pregnancy or ectopic
pregnancy.
2. Findings are likely due to the early stage in pregnancy. This
does not exclude failed pregnancy or ectopic pregnancy. Patient
warrants followup imaging. Recommend serial beta HCG and follow-up
ultrasound in 7-10 days to document normal pregnancy progression.

## 2017-09-19 DIAGNOSIS — G932 Benign intracranial hypertension: Secondary | ICD-10-CM | POA: Diagnosis not present

## 2017-09-19 DIAGNOSIS — Z3202 Encounter for pregnancy test, result negative: Secondary | ICD-10-CM | POA: Diagnosis not present

## 2017-09-19 DIAGNOSIS — R51 Headache: Secondary | ICD-10-CM | POA: Diagnosis not present

## 2017-09-19 DIAGNOSIS — H4710 Unspecified papilledema: Secondary | ICD-10-CM | POA: Diagnosis not present

## 2017-09-19 DIAGNOSIS — H539 Unspecified visual disturbance: Secondary | ICD-10-CM | POA: Diagnosis not present

## 2017-09-30 DIAGNOSIS — G932 Benign intracranial hypertension: Secondary | ICD-10-CM | POA: Diagnosis not present

## 2017-10-06 DIAGNOSIS — Z202 Contact with and (suspected) exposure to infections with a predominantly sexual mode of transmission: Secondary | ICD-10-CM | POA: Diagnosis not present

## 2017-10-06 DIAGNOSIS — H4711 Papilledema associated with increased intracranial pressure: Secondary | ICD-10-CM | POA: Diagnosis not present

## 2017-10-06 DIAGNOSIS — R635 Abnormal weight gain: Secondary | ICD-10-CM | POA: Diagnosis not present

## 2017-10-14 DIAGNOSIS — Z79899 Other long term (current) drug therapy: Secondary | ICD-10-CM | POA: Diagnosis not present

## 2017-10-14 DIAGNOSIS — G932 Benign intracranial hypertension: Secondary | ICD-10-CM | POA: Diagnosis not present

## 2017-10-15 DIAGNOSIS — G932 Benign intracranial hypertension: Secondary | ICD-10-CM | POA: Insufficient documentation

## 2017-10-19 DIAGNOSIS — L700 Acne vulgaris: Secondary | ICD-10-CM | POA: Diagnosis not present

## 2017-10-19 DIAGNOSIS — L910 Hypertrophic scar: Secondary | ICD-10-CM | POA: Diagnosis not present

## 2017-10-19 DIAGNOSIS — Z23 Encounter for immunization: Secondary | ICD-10-CM | POA: Diagnosis not present

## 2017-11-26 DIAGNOSIS — J029 Acute pharyngitis, unspecified: Secondary | ICD-10-CM | POA: Diagnosis not present

## 2017-11-26 DIAGNOSIS — J1089 Influenza due to other identified influenza virus with other manifestations: Secondary | ICD-10-CM | POA: Diagnosis not present

## 2017-12-02 DIAGNOSIS — L910 Hypertrophic scar: Secondary | ICD-10-CM | POA: Diagnosis not present

## 2018-03-10 DIAGNOSIS — N760 Acute vaginitis: Secondary | ICD-10-CM | POA: Diagnosis not present

## 2018-03-10 DIAGNOSIS — Z202 Contact with and (suspected) exposure to infections with a predominantly sexual mode of transmission: Secondary | ICD-10-CM | POA: Diagnosis not present

## 2018-10-22 ENCOUNTER — Emergency Department (HOSPITAL_COMMUNITY)
Admission: EM | Admit: 2018-10-22 | Discharge: 2018-10-22 | Disposition: A | Discharge status: Home / Self Care | Payer: Self-pay | Attending: Emergency Medicine | Admitting: Emergency Medicine

## 2018-10-22 ENCOUNTER — Encounter (HOSPITAL_COMMUNITY): Payer: Self-pay

## 2018-10-22 DIAGNOSIS — G932 Benign intracranial hypertension: Secondary | ICD-10-CM | POA: Insufficient documentation

## 2018-10-22 DIAGNOSIS — Z87891 Personal history of nicotine dependence: Secondary | ICD-10-CM | POA: Insufficient documentation

## 2018-10-22 DIAGNOSIS — Z79899 Other long term (current) drug therapy: Secondary | ICD-10-CM | POA: Insufficient documentation

## 2018-10-22 LAB — CBC WITH DIFFERENTIAL/PLATELET
Abs Immature Granulocytes: 0.03 10*3/uL (ref 0.00–0.07)
Basophils Absolute: 0 10*3/uL (ref 0.0–0.1)
Basophils Relative: 0 %
EOS ABS: 0.2 10*3/uL (ref 0.0–0.5)
EOS PCT: 2 %
HEMATOCRIT: 43 % (ref 36.0–46.0)
Hemoglobin: 12.8 g/dL (ref 12.0–15.0)
Immature Granulocytes: 0 %
LYMPHS ABS: 2.7 10*3/uL (ref 0.7–4.0)
Lymphocytes Relative: 34 %
MCH: 21.8 pg — AB (ref 26.0–34.0)
MCHC: 29.8 g/dL — AB (ref 30.0–36.0)
MCV: 73.4 fL — AB (ref 80.0–100.0)
MONOS PCT: 6 %
Monocytes Absolute: 0.5 10*3/uL (ref 0.1–1.0)
NRBC: 0 % (ref 0.0–0.2)
Neutro Abs: 4.4 10*3/uL (ref 1.7–7.7)
Neutrophils Relative %: 58 %
Platelets: 363 10*3/uL (ref 150–400)
RBC: 5.86 MIL/uL — ABNORMAL HIGH (ref 3.87–5.11)
RDW: 15.9 % — ABNORMAL HIGH (ref 11.5–15.5)
WBC: 7.8 10*3/uL (ref 4.0–10.5)

## 2018-10-22 LAB — BASIC METABOLIC PANEL WITH GFR
Anion gap: 6 (ref 5–15)
BUN: 7 mg/dL (ref 6–20)
CO2: 18 mmol/L — ABNORMAL LOW (ref 22–32)
Calcium: 9 mg/dL (ref 8.9–10.3)
Chloride: 113 mmol/L — ABNORMAL HIGH (ref 98–111)
Creatinine, Ser: 0.86 mg/dL (ref 0.44–1.00)
GFR calc Af Amer: >60 mL/min
GFR calc non Af Amer: >60 mL/min
Glucose, Bld: 100 mg/dL — ABNORMAL HIGH (ref 70–99)
Potassium: 3.8 mmol/L (ref 3.5–5.1)
Sodium: 137 mmol/L (ref 135–145)

## 2018-10-22 LAB — I-STAT BETA HCG BLOOD, ED (MC, WL, AP ONLY)

## 2018-10-22 MED ORDER — MORPHINE SULFATE (PF) 4 MG/ML IV SOLN
4.0000 mg | Freq: Once | INTRAVENOUS | Status: AC
  Administered 2018-10-22: 4 mg via INTRAVENOUS
  Filled 2018-10-22: qty 1

## 2018-10-22 MED ORDER — STERILE WATER FOR INJECTION IJ SOLN
INTRAMUSCULAR | Status: AC
  Administered 2018-10-22: 5 mL
  Filled 2018-10-22: qty 10

## 2018-10-22 MED ORDER — TOPIRAMATE 25 MG PO TABS: ORAL_TABLET | ORAL | 0 refills | Status: DC

## 2018-10-22 MED ORDER — ACETAZOLAMIDE SODIUM 500 MG IJ SOLR
500.0000 mg | Freq: Once | INTRAMUSCULAR | Status: AC
  Administered 2018-10-22: 500 mg via INTRAVENOUS
  Filled 2018-10-22: qty 500

## 2018-10-22 MED ORDER — POTASSIUM CHLORIDE ER 10 MEQ PO TBCR: 10.0000 meq | EXTENDED_RELEASE_TABLET | Freq: Every day | ORAL | 0 refills | Status: DC

## 2018-10-22 MED ORDER — ONDANSETRON 4 MG PO TBDP: 4.0000 mg | ORAL_TABLET | Freq: Three times a day (TID) | ORAL | 0 refills | Status: DC | PRN

## 2018-10-22 MED ORDER — ONDANSETRON HCL 4 MG/2ML IJ SOLN
4.0000 mg | Freq: Once | INTRAMUSCULAR | Status: AC
  Administered 2018-10-22: 4 mg via INTRAVENOUS
  Filled 2018-10-22: qty 2

## 2018-10-22 MED ORDER — FUROSEMIDE 10 MG/ML IJ SOLN
40.0000 mg | Freq: Once | INTRAMUSCULAR | Status: AC
  Administered 2018-10-22: 40 mg via INTRAVENOUS
  Filled 2018-10-22: qty 4

## 2018-10-22 MED ORDER — HYDROCODONE-ACETAMINOPHEN 5-325 MG PO TABS: 1.0000 | ORAL_TABLET | ORAL | 0 refills | Status: DC | PRN

## 2018-10-22 MED ORDER — FUROSEMIDE 40 MG PO TABS: 40.0000 mg | ORAL_TABLET | Freq: Every day | ORAL | 0 refills | Status: DC

## 2018-10-22 NOTE — ED Triage Notes (Signed)
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

## 2018-10-22 NOTE — ED Provider Notes (Signed)
[facility] Provider Note   CSN: [id number] Arrival date & time: 10/22/18  [id number]    History   Chief Complaint Chief Complaint  Patient presents with  . Migraine    HPI Kara Fernandez is a 28 y.o. female.     Pt presents to the ED today with a migraine.  She has a hx of migraines.  She has a hx of Idiopathic intracranial hypertension for which she's been prescribed diamox.  She has paresthesias of her feet which she's had since taking the diamox.  She's only been taking the diamox once a week because of the side effects.  She has some vision changes in her left eye.  She is due for f/u with her neurologist [facility] but has not followed up.     Past Medical History:  Diagnosis Date  . Abnormal Pap smear    completed  at age 28  . Angina ~2000   enlarged heart, pain due to stress  . ASCUS on Pap smear    C&B 10/09 W/LGSIL  . Asthma    as a child   . Chlamydia infection    POSITIVE 10/06,8/07,7/09,11/11 ;at age 28  . GC (gonococcus infection)   . Gonorrhea    POSITIVE 8/06, 10/06; at age 28  . H/O bacterial infection    frequently  . H/O multiple allergies   . H/O varicella   . History of high blood pressure   . 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 HSV (herpes simplex virus) infection   . 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 Hypertension    not while rpegnant  . Ovarian cyst   . Ovarian cyst   . Trichomonal vulvovaginitis   . UTI (lower urinary tract infection)   . Vaginal Pap smear, abnormal   . Yeast infection     Patient Active Problem List   Diagnosis Date Noted  . Costochondritis 11/15/2015  . Previous cesarean section 02/27/2015  . Status post repeat [facility] 02/27/[facility]  . Vaginal bleeding in pregnancy 07/10/2014  . Fetal heart rate decelerations affecting management of mother 12/30/2011  . History of  chlamydia 12/29/2011  . History of gonorrhea 12/29/2011  . History of asthma - childhood 12/29/2011  . Anemia 12/29/2011  . 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 CESAREAN SECTION  12/29/2011   Procedure: CESAREAN SECTION;  Surgeon: Purcell NailsAngela Y Roberts, MD;  Location: [facility];  Service: Gynecology;  Laterality: N/A;  . CESAREAN SECTION N/A 02/26/2015   Procedure: CESAREAN SECTION;  Surgeon: Osborn CohoAngela Roberts, MD;  Location: [facility];  Service: Obstetrics;  Laterality: N/A;  . DILATION AND CURETTAGE OF UTERUS     abortion     OB History    Gravida  3   Para  2   Term  2   Preterm      AB  1   Living  2     SAB      TAB  1   Ectopic      Multiple  0   Live Births  2            Home Medications    Prior to Admission medications   Medication Sig Start Date End Date Taking? Authorizing Provider  acetaZOLAMIDE (DIAMOX) 250  MG tablet Take 500 mg by mouth daily. 10/14/17  Yes [provider]  ibuprofen (ADVIL,MOTRIN) 800 MG tablet Take 1 tablet (800 mg total) by mouth 2 (two) times daily. Take for 2 weeks. Patient taking differently: Take 600 mg by mouth every 8 (eight) hours as needed for headache.  11/14/15  Yes Hilty, Lisette Abu, MD  omeprazole (PRILOSEC) 20 MG capsule Take 1 capsule (20 mg total) by mouth daily. Patient taking differently: Take 20 mg by mouth as needed (reflux).  08/07/16 10/22/18 Yes Cardama, Amadeo Garnet, MD  clobetasol ointment (TEMOVATE) 0.05 % Apply 1 application topically 2 (two) times daily. Patient not taking: Reported on 10/22/2018 12/04/15   Sharen Counter A, CNM  furosemide (LASIX) 40 MG tablet Take 1 tablet (40 mg total) by mouth daily. 10/22/18   Jacalyn Lefevre, MD  HYDROcodone-acetaminophen (NORCO/VICODIN) 5-325 MG tablet Take 1 tablet by mouth every 4 (four) hours as needed. 10/22/18   Jacalyn Lefevre, MD  ondansetron (ZOFRAN ODT) 4 MG disintegrating tablet Take 1 tablet (4  mg total) by mouth every 8 (eight) hours as needed. 10/22/18   Jacalyn Lefevre, MD  potassium chloride (K-DUR) 10 MEQ tablet Take 1 tablet (10 mEq total) by mouth daily. 10/22/18   Jacalyn Lefevre, MD  topiramate (TOPAMAX) 25 MG tablet Start with 25 mg daily, then increase to 50 after 7 days. 10/22/18   Jacalyn Lefevre, MD  valACYclovir (VALTREX) 500 MG tablet Take 1 tablet (500 mg total) by mouth 2 (two) times daily as needed (for breakouts). Patient not taking: Reported on 10/22/2018 12/04/15   Hurshel Party, CNM    Family History Family History  Problem Relation Age of Onset  . Hypertension Mother   . Hypertension Father   . Hypertension Maternal Grandmother   . Stroke Maternal Grandmother   . Hypertension Maternal Grandfather   . Hypertension Maternal Aunt   . Hypertension Maternal Uncle   . Alcohol abuse Maternal Uncle   . Alcohol abuse Paternal Uncle     Social History Social History   Tobacco Use  . Smoking status: Former Games developer  . Smokeless tobacco: Never Used  Substance Use Topics  . Alcohol use: Yes    Comment: occasionally; "i drink on weekends"  . Drug use: Yes    Types: Marijuana    Comment: 08/24/13     Allergies   Patient has no known allergies.   Review of Systems Review of Systems  Eyes: Positive for photophobia and visual disturbance.  Neurological: Positive for headaches.  All other systems reviewed and are negative.    Physical Exam Updated Vital Signs BP 110/77   Pulse 74   Temp 99.4 F (37.4 C) (Oral)   Resp 16   Ht 5\' 1"  (1.549 m)   Wt 79.8 kg   SpO2 98%   BMI 33.25 kg/m   Physical Exam Vitals signs and nursing note reviewed.  Constitutional:      Appearance: Normal appearance.  HENT:     Head: Normocephalic and atraumatic.     Right Ear: External ear normal.     Left Ear: External ear normal.     Nose: Nose normal.     Mouth/Throat:     Mouth: Mucous membranes are moist.  Eyes:     Extraocular Movements: Extraocular  movements intact.     Pupils: Pupils are equal, round, and reactive to light.  Neck:     Musculoskeletal: Normal range of motion and neck supple.  Cardiovascular:  Rate and Rhythm: Normal rate and regular rhythm.     Pulses: Normal pulses.     Heart sounds: Normal heart sounds.  Pulmonary:     Effort: Pulmonary effort is normal.     Breath sounds: Normal breath sounds.  Abdominal:     General: Abdomen is flat. Bowel sounds are normal.     Palpations: Abdomen is soft.  Musculoskeletal: Normal range of motion.  Skin:    General: Skin is warm.     Capillary Refill: Capillary refill takes less than 2 seconds.  Neurological:     General: No focal deficit present.     Mental Status: She is alert and oriented to person, place, and time.  Psychiatric:        Mood and Affect: Mood normal.        Behavior: Behavior normal.      ED Treatments / Results  Labs (all labs ordered are listed, but only abnormal results are displayed) Labs Reviewed  CBC WITH DIFFERENTIAL/PLATELET - Abnormal; Notable for the following components:      Result Value   RBC 5.86 (*)    MCV 73.4 (*)    MCH 21.8 (*)    MCHC 29.8 (*)    RDW 15.9 (*)    All other components within normal limits  BASIC METABOLIC PANEL - Abnormal; Notable for the following components:   Chloride 113 (*)    CO2 18 (*)    Glucose, Bld 100 (*)    All other components within normal limits  I-STAT BETA HCG BLOOD, ED ([facility], [facility], [facility] ONLY)    EKG None  Radiology No results found.  Procedures Procedures (including critical care time)  Medications Ordered in ED Medications  morphine 4 MG/ML injection 4 mg (4 mg Intravenous Given 10/22/18 1020)  ondansetron (ZOFRAN) injection 4 mg (4 mg Intravenous Given 10/22/18 1019)  furosemide (LASIX) injection 40 mg (40 mg Intravenous Given 10/22/18 1020)  acetaZOLAMIDE (DIAMOX) injection 500 mg (500 mg Intravenous Given 10/22/18 1020)  sterile water (preservative free) injection (5 mLs   Given 10/22/18 1020)     Initial Impression / Assessment and Plan / ED Course  I have reviewed the triage vital signs and the nursing notes.  Pertinent labs & imaging results that were available during my care of the patient were reviewed by me and considered in my medical decision making (see chart for details).       She has had MRI and ophthalmology work up to make sure there is no other cause of elevated intracranial pressure.  Headache is better.  Vision is better.  She can't tolerate the diamox, so I will put her on lasix and topamax.  She is encouraged to f/u with her neurologist.  Return if worse.  Final Clinical Impressions(s) / ED Diagnoses   Final diagnoses:  Idiopathic intracranial hypertension    ED Discharge Orders         Ordered    furosemide (LASIX) 40 MG tablet  Daily     10/22/18 1102    potassium chloride (K-DUR) 10 MEQ tablet  Daily     10/22/18 1102    topiramate (TOPAMAX) 25 MG tablet     10/22/18 1102    HYDROcodone-acetaminophen (NORCO/VICODIN) 5-325 MG tablet  Every 4 hours PRN     10/22/18 1104    ondansetron (ZOFRAN ODT) 4 MG disintegrating tablet  Every 8 hours PRN     10/22/18 1104  Jacalyn Lefevre, MD 10/22/18 1104

## 2019-12-26 ENCOUNTER — Other Ambulatory Visit: Payer: Self-pay

## 2019-12-26 ENCOUNTER — Encounter (HOSPITAL_COMMUNITY): Payer: Self-pay

## 2019-12-26 ENCOUNTER — Ambulatory Visit (HOSPITAL_COMMUNITY)
Admission: EM | Admit: 2019-12-26 | Discharge: 2019-12-26 | Disposition: A | Discharge status: Home / Self Care | Payer: Medicaid Other | Attending: Urgent Care | Admitting: Urgent Care

## 2019-12-26 DIAGNOSIS — R21 Rash and other nonspecific skin eruption: Secondary | ICD-10-CM

## 2019-12-26 DIAGNOSIS — L739 Follicular disorder, unspecified: Secondary | ICD-10-CM

## 2019-12-26 DIAGNOSIS — L299 Pruritus, unspecified: Secondary | ICD-10-CM

## 2019-12-26 DIAGNOSIS — Z9109 Other allergy status, other than to drugs and biological substances: Secondary | ICD-10-CM

## 2019-12-26 MED ORDER — TRIAMCINOLONE ACETONIDE 0.1 % EX CREA: 1.0000 "application " | TOPICAL_CREAM | Freq: Two times a day (BID) | CUTANEOUS | 0 refills | Status: DC

## 2019-12-26 MED ORDER — HYDROXYZINE HCL 25 MG PO TABS: 12.5000 mg | ORAL_TABLET | Freq: Three times a day (TID) | ORAL | 0 refills | Status: DC | PRN

## 2019-12-26 NOTE — ED Triage Notes (Signed)
Pt states went to [place] a week ago and woke up with a rash the next day on left leg and arm. Pt has a lot of tiny raised bumps on left arm and leg and pt states its itching.

## 2019-12-26 NOTE — ED Provider Notes (Signed)
[facility]   MRN: [id number] DOB: 1990-12-01  Subjective:   Kara Fernandez is a 29 y.o. female presenting for 4 to 5-day history of persistent itchy rash.  Patient actually admits that it is improving but is diffusely itchy over her limbs but also back.  Patient states that she was in [place] spent a lot of time outdoors but cannot recall any particular inciting factors.  Denies fever, chest tightness, shortness of breath, oral swelling.  Patient has had allergic reactions before but this is different.  She is very concerned that she has some sort of her disease and is requesting an entire panel blood work to make sure she is healthy.  She does not have a PCP.  No current facility-administered medications for this encounter.  Current Outpatient Medications:  .  acetaZOLAMIDE (DIAMOX) 250 MG tablet, Take 500 mg by mouth daily., Disp: , Rfl:  .  clobetasol ointment (TEMOVATE) 0.05 %, Apply 1 application topically 2 (two) times daily. (Patient not taking: Reported on 10/22/2018), Disp: 30 g, Rfl: 0 .  furosemide (LASIX) 40 MG tablet, Take 1 tablet (40 mg total) by mouth daily., Disp: 30 tablet, Rfl: 0 .  ibuprofen (ADVIL,MOTRIN) 800 MG tablet, Take 1 tablet (800 mg total) by mouth 2 (two) times daily. Take for 2 weeks. (Patient taking differently: Take 600 mg by mouth every 8 (eight) hours as needed for headache. ), Disp: 28 tablet, Rfl: 0 .  omeprazole (PRILOSEC) 20 MG capsule, Take 1 capsule (20 mg total) by mouth daily. (Patient taking differently: Take 20 mg by mouth as needed (reflux). ), Disp: 30 capsule, Rfl: 0 .  ondansetron (ZOFRAN ODT) 4 MG disintegrating tablet, Take 1 tablet (4 mg total) by mouth every 8 (eight) hours as needed., Disp: 10 tablet, Rfl: 0 .  topiramate (TOPAMAX) 25 MG tablet, Start with 25 mg daily, then increase to 50 after 7 days., Disp: 40 tablet, Rfl: 0 .  valACYclovir (VALTREX) 500 MG tablet, Take 1 tablet (500 mg total) by mouth 2 (two) times daily  as needed (for breakouts). (Patient not taking: Reported on 10/22/2018), Disp: 6 tablet, Rfl: 11   No Known Allergies  Past Medical History:  Diagnosis Date  . Abnormal Pap smear    completed  at age 71  . Angina ~2000   enlarged heart, pain due to stress  . ASCUS on Pap smear    C&B 10/09 W/LGSIL  . Asthma    as a child   . Chlamydia infection    POSITIVE 10/06,8/07,7/09,11/11 ;at age 51  . GC (gonococcus infection)   . Gonorrhea    POSITIVE 8/06, 10/06; at age 41  . H/O bacterial infection    frequently  . H/O multiple allergies   . H/O varicella   . History of high blood pressure   . 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 HSV (herpes simplex virus) infection   . 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 Hypertension    not while rpegnant  . Ovarian cyst   . Ovarian cyst   . Trichomonal vulvovaginitis   . UTI (lower urinary tract infection)   . Vaginal Pap smear, abnormal   . Yeast infection      Past Surgical History:  Procedure Laterality Date  . CESAREAN SECTION  12/29/2011   Procedure: CESAREAN SECTION;  Surgeon: Purcell Nails, MD;  Location: [facility];  Service: Gynecology;  Laterality: N/A;  . CESAREAN SECTION N/A 02/26/2015   Procedure: CESAREAN SECTION;  Surgeon: Everett Graff, MD;  Location: [place] [facility];  Service: Obstetrics;  Laterality: N/A;  . DILATION AND CURETTAGE OF UTERUS     abortion    Family History  Problem Relation Age of Onset  . Hypertension Mother   . Hypertension Father   . Hypertension Maternal Grandmother   . Stroke Maternal Grandmother   . Hypertension Maternal Grandfather   . Hypertension Maternal Aunt   . Hypertension Maternal Uncle   . Alcohol abuse Maternal Uncle   . Alcohol abuse Paternal Uncle     Social History   Tobacco Use  . Smoking status: Former Research scientist (life sciences)  . Smokeless tobacco: Never Used  Substance Use Topics  . Alcohol use: Yes    Comment: occasionally; "i drink on  weekends"  . Drug use: Yes    Types: Marijuana    Comment: 08/24/13    ROS   Objective:   Vitals: BP 125/82   Pulse 76   Temp 98.5 F (36.9 C) (Oral)   Resp 16   Ht 5\' 1"  (1.549 m)   Wt 174 lb (78.9 kg)   SpO2 100%   BMI 32.88 kg/m   Physical Exam Constitutional:      General: She is not in acute distress.    Appearance: Normal appearance. She is well-developed. She is obese. She is not ill-appearing, toxic-appearing or diaphoretic.  HENT:     Head: Normocephalic and atraumatic.     Nose: Nose normal.     Mouth/Throat:     Mouth: Mucous membranes are moist.     Pharynx: Oropharynx is clear.     Comments: Airway is patent. Eyes:     General: No scleral icterus.       Right eye: No discharge.        Left eye: No discharge.     Extraocular Movements: Extraocular movements intact.     Pupils: Pupils are equal, round, and reactive to light.  Cardiovascular:     Rate and Rhythm: Normal rate.  Pulmonary:     Effort: Pulmonary effort is normal.  Skin:    General: Skin is warm and dry.     Findings: Rash (Multiple nodular resolving lesions mostly associated with hair follicles over her left upper arm, lateral thighs, right forearm; the face, back and neck are spared) present.  Neurological:     General: No focal deficit present.     Mental Status: She is alert and oriented to person, place, and time.  Psychiatric:        Mood and Affect: Mood is anxious.        Speech: Speech is rapid and pressured.     Assessment and Plan :   PDMP not reviewed this encounter.  1. Folliculitis   2. Rash and nonspecific skin eruption   3. Itching   4. Environmental allergies     Suspect folliculitis (inflammatory not bacterial) versus contact dermatitis.  At this point there is no sign of an infectious rash.  Recommended starting hydroxyzine and triamcinolone for solitary nodules.  Counseled patient that she needs a PCP so that she can get the panel of labs she wants for  establishing her health status. Counseled patient on potential for adverse effects with medications prescribed/recommended today, ER and return-to-clinic precautions discussed, patient verbalized understanding.    Jaynee Eagles, PA-C 12/26/19 1022

## 2021-07-30 ENCOUNTER — Inpatient Hospital Stay (HOSPITAL_COMMUNITY): Payer: Medicaid Other

## 2021-07-30 ENCOUNTER — Inpatient Hospital Stay (HOSPITAL_COMMUNITY)
Admission: AD | Admit: 2021-07-30 | Discharge: 2021-07-30 | Disposition: A | Discharge status: Home / Self Care | Payer: Medicaid Other | Attending: Obstetrics and Gynecology | Admitting: Obstetrics and Gynecology

## 2021-07-30 ENCOUNTER — Encounter (HOSPITAL_COMMUNITY): Payer: Self-pay | Admitting: Obstetrics and Gynecology

## 2021-07-30 DIAGNOSIS — O26891 Other specified pregnancy related conditions, first trimester: Secondary | ICD-10-CM | POA: Insufficient documentation

## 2021-07-30 DIAGNOSIS — O3680X0 Pregnancy with inconclusive fetal viability, not applicable or unspecified: Secondary | ICD-10-CM | POA: Insufficient documentation

## 2021-07-30 DIAGNOSIS — O26899 Other specified pregnancy related conditions, unspecified trimester: Secondary | ICD-10-CM

## 2021-07-30 DIAGNOSIS — M549 Dorsalgia, unspecified: Secondary | ICD-10-CM | POA: Insufficient documentation

## 2021-07-30 DIAGNOSIS — O99891 Other specified diseases and conditions complicating pregnancy: Secondary | ICD-10-CM

## 2021-07-30 DIAGNOSIS — Z3A01 Less than 8 weeks gestation of pregnancy: Secondary | ICD-10-CM | POA: Insufficient documentation

## 2021-07-30 DIAGNOSIS — R109 Unspecified abdominal pain: Secondary | ICD-10-CM

## 2021-07-30 DIAGNOSIS — R1030 Lower abdominal pain, unspecified: Secondary | ICD-10-CM | POA: Insufficient documentation

## 2021-07-30 LAB — URINALYSIS, ROUTINE W REFLEX MICROSCOPIC
Bilirubin Urine: NEGATIVE
Glucose, UA: NEGATIVE mg/dL
Hgb urine dipstick: NEGATIVE
Ketones, ur: NEGATIVE mg/dL
Leukocytes,Ua: NEGATIVE
Nitrite: NEGATIVE
Protein, ur: NEGATIVE mg/dL
Specific Gravity, Urine: 1.02 (ref 1.005–1.030)
pH: 6 (ref 5.0–8.0)

## 2021-07-30 LAB — BASIC METABOLIC PANEL
Anion gap: 8 (ref 5–15)
BUN: 8 mg/dL (ref 6–20)
CO2: 20 mmol/L — ABNORMAL LOW (ref 22–32)
Calcium: 8.6 mg/dL — ABNORMAL LOW (ref 8.9–10.3)
Chloride: 104 mmol/L (ref 98–111)
Creatinine, Ser: 0.8 mg/dL (ref 0.44–1.00)
GFR, Estimated: >60 mL/min (ref >60)
Glucose, Bld: 103 mg/dL — ABNORMAL HIGH (ref 70–99)
Potassium: 3.6 mmol/L (ref 3.5–5.1)
Sodium: 132 mmol/L — ABNORMAL LOW (ref 135–145)

## 2021-07-30 LAB — CBC WITH DIFFERENTIAL/PLATELET
Abs Immature Granulocytes: 0.05 10*3/uL (ref 0.00–0.07)
Basophils Absolute: 0.1 10*3/uL (ref 0.0–0.1)
Basophils Relative: 1 %
Eosinophils Absolute: 0.2 10*3/uL (ref 0.0–0.5)
Eosinophils Relative: 2 %
HCT: 38.6 % (ref 36.0–46.0)
Hemoglobin: 12.3 g/dL (ref 12.0–15.0)
Immature Granulocytes: 1 %
Lymphocytes Relative: 43 %
Lymphs Abs: 4.5 10*3/uL — ABNORMAL HIGH (ref 0.7–4.0)
MCH: 22 pg — ABNORMAL LOW (ref 26.0–34.0)
MCHC: 31.9 g/dL (ref 30.0–36.0)
MCV: 69.1 fL — ABNORMAL LOW (ref 80.0–100.0)
Monocytes Absolute: 0.8 10*3/uL (ref 0.1–1.0)
Monocytes Relative: 8 %
Neutro Abs: 4.7 10*3/uL (ref 1.7–7.7)
Neutrophils Relative %: 45 %
Platelets: 365 10*3/uL (ref 150–400)
RBC: 5.59 MIL/uL — ABNORMAL HIGH (ref 3.87–5.11)
RDW: 15.9 % — ABNORMAL HIGH (ref 11.5–15.5)
WBC: 10.3 10*3/uL (ref 4.0–10.5)
nRBC: 0 % (ref 0.0–0.2)

## 2021-07-30 LAB — WET PREP, GENITAL
Clue Cells Wet Prep HPF POC: NONE SEEN
Sperm: NONE SEEN
Trich, Wet Prep: NONE SEEN
WBC, Wet Prep HPF POC: <10 (ref <10)
Yeast Wet Prep HPF POC: NONE SEEN

## 2021-07-30 LAB — HCG, QUANTITATIVE, PREGNANCY: hCG, Beta Chain, Quant, S: 755 m[IU]/mL — ABNORMAL HIGH (ref <5)

## 2021-07-30 LAB — POCT PREGNANCY, URINE: Preg Test, Ur: POSITIVE — AB

## 2021-07-30 NOTE — MAU Provider Note (Signed)
History     CSN: [id number]  Arrival date and time: 07/30/21 1759   None     Chief Complaint  Patient presents with   Abdominal Pain   Back Pain    HPI Kara Fernandez is a 30 y.o. [id number] at [redacted]w[redacted]d by LMP who presents to MAU for lower abdominal and back pain. Patient reports pain has been ongoing for several days. Describes lower abdominal pain as intermittent, sharp and crampy that is worse with lifting things and improves with lying down. Back pain is dully and achy and is worse with certain movements. She called her PCP who told her to take Tylenol, which does help ease pain some, but never fully takes it away. Patient works in a warehouse full time, lifting boxes and thinks pain is related to a pulled muscle. Some white vaginal discharge as well. She denies vaginal bleeding, odor, itching/irritation or urinary s/s.   OB History     Gravida  4   Para  2   Term  2   Preterm      AB  1   Living  2      SAB      IAB  1   Ectopic      Multiple  0   Live Births  2           Past Medical History:  Diagnosis Date   Abnormal Pap smear    completed  at age 89   Angina ~2000   enlarged heart, pain due to stress   ASCUS on Pap smear    C&B 10/09 W/LGSIL   Asthma    as a child    Chlamydia infection    POSITIVE 10/06,8/07,7/09,11/11 ;at age 38   GC (gonococcus infection)    Gonorrhea    POSITIVE 8/06, 10/06; at age 57   H/O bacterial infection    frequently   H/O multiple allergies    H/O varicella    History of high blood pressure    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   Hypertension    not while rpegnant   Ovarian cyst    Ovarian cyst    Trichomonal vulvovaginitis    UTI (lower urinary tract infection)    Vaginal Pap smear, abnormal    Yeast infection     Past Surgical History:  Procedure Laterality Date    CESAREAN SECTION  12/29/2011   Procedure: CESAREAN SECTION;  Surgeon: Purcell Nails, MD;  Location: [facility];  Service: Gynecology;  Laterality: N/A;   CESAREAN SECTION N/A 02/26/2015   Procedure: CESAREAN SECTION;  Surgeon: Osborn Coho, MD;  Location: [facility];  Service: Obstetrics;  Laterality: N/A;   DILATION AND CURETTAGE OF UTERUS     abortion    Family History  Problem Relation Age of Onset   Hypertension Mother    Hypertension Father    Hypertension Maternal Grandmother    Stroke Maternal Grandmother    Hypertension Maternal Grandfather    Hypertension Maternal Aunt    Hypertension Maternal Uncle    Alcohol abuse Maternal Uncle    Alcohol abuse Paternal Uncle     Social History   Tobacco Use   Smoking status: Former   Smokeless tobacco: Never  Building services engineer Use: Former  Substance Use Topics  Alcohol use: Yes    Comment: occasionally; "i drink on weekends"   Drug use: Yes    Types: Marijuana    Comment: 08/24/13    Allergies: No Known Allergies  No medications prior to admission.    Review of Systems  Constitutional: Negative.   Respiratory: Negative.    Cardiovascular: Negative.   Gastrointestinal:  Positive for abdominal pain. Negative for diarrhea, nausea and vomiting.  Genitourinary:  Positive for vaginal discharge. Negative for dysuria and vaginal bleeding.  Musculoskeletal:  Positive for back pain.  Neurological: Negative.    Physical Exam   Blood pressure 122/72, pulse 78, temperature 98.4 F (36.9 C), temperature source Oral, resp. rate 16, height 5\' 1"  (1.549 m), weight 78.5 kg, last menstrual period 07/01/2021, SpO2 100 %, unknown if currently breastfeeding.  Physical Exam Vitals and nursing note reviewed.  Constitutional:      General: She is not in acute distress.    Appearance: She is obese.  Eyes:     Pupils: Pupils are equal, round, and reactive to light.  Cardiovascular:     Rate and Rhythm: Normal rate.  Pulmonary:      Effort: Pulmonary effort is normal.  Abdominal:     Palpations: Abdomen is soft.     Tenderness: There is no abdominal tenderness. There is no guarding.  Genitourinary:    Comments: Patient self swabbed Musculoskeletal:        General: Normal range of motion.  Skin:    General: Skin is warm and dry.  Neurological:     General: No focal deficit present.     Mental Status: She is alert and oriented to person, place, and time.  Psychiatric:        Mood and Affect: Mood normal.        Behavior: Behavior normal.        Thought Content: Thought content normal.        Judgment: Judgment normal.    13/02/2021 OB LESS THAN 14 WEEKS WITH OB TRANSVAGINAL  Result Date: 07/30/2021 CLINICAL DATA:  Pelvic pain, positive pregnancy test EXAM: OBSTETRIC <14 WK 14/01/2021 AND TRANSVAGINAL OB US TECHNIQUE: Both transabdominal and transvaginal ultrasound examinations were performed for complete evaluation of the gestation as well as the maternal uterus, adnexal regions, and pelvic cul-de-sac. Transvaginal technique was performed to assess early pregnancy. COMPARISON:  None. FINDINGS: Intrauterine gestational sac: Absent Maternal uterus/adnexae: Uterus is within normal limits. Ovaries are well visualized and within normal limits. IMPRESSION: No evidence of intrauterine or extrauterine gestation. Correlation with serial beta HCG levels is recommended. Ultrasound can be performed as clinically indicated. No other focal abnormality is noted. Electronically Signed   By: [place] M.D.   On: 07/30/2021 21:06     MAU Course  Procedures  MDM UA wnl CBC, BMP, HCG 755 Wet prep, GC/CT collected 14/01/2021  Assessment and Plan  Pregnancy of unknown anatomic location Abdominal pain affecting pregnancy Back pain affecting pregnancy  - Discharge home in stable condition - Go to [facility] on 12/8 for repeat HCG - Strict return precautions reviewed. Return to MAU as needed - May continue to use Tylenol prn. Advised heat/ice,  stretching.    14/8, CNM 07/30/2021, 11:46 PM

## 2021-07-30 NOTE — Progress Notes (Signed)
Kara Fernandez CNM in earlier to discuss test results and d/c plan. Written and verbal d/c instructions given and understanding voiced. ?

## 2021-07-30 NOTE — MAU Note (Signed)
For the past 3 days, has been having lower abd and lower back pain.  Called FP dr, was told to take Tylenol, sometimes it helps, some times  it doesn't touch it. Is concerned because she works at [employer]. This morning, she felt like she was having period cramps.  Is not having any bleeding.  (Short cycles) preg confirmed last wk at dr's.

## 2021-07-30 NOTE — MAU Note (Addendum)
Camelia Eng CNM in Triage to see pt and discuss plan of care. Pt instructed in collecting vag swabs which she did without difficulty

## 2021-08-01 ENCOUNTER — Other Ambulatory Visit: Payer: Self-pay | Admitting: Family Medicine

## 2021-08-01 ENCOUNTER — Other Ambulatory Visit (INDEPENDENT_AMBULATORY_CARE_PROVIDER_SITE_OTHER): Payer: Self-pay | Admitting: General Practice

## 2021-08-01 ENCOUNTER — Other Ambulatory Visit: Payer: Self-pay

## 2021-08-01 VITALS — BP 114/71 | HR 79

## 2021-08-01 DIAGNOSIS — O3680X0 Pregnancy with inconclusive fetal viability, not applicable or unspecified: Secondary | ICD-10-CM

## 2021-08-01 LAB — BETA HCG QUANT (REF LAB): hCG Quant: 1013 m[IU]/mL

## 2021-08-01 LAB — GC/CHLAMYDIA PROBE AMP (~~LOC~~) NOT AT ARMC
Chlamydia: NEGATIVE
Comment: NEGATIVE
Comment: NORMAL
Neisseria Gonorrhea: NEGATIVE

## 2021-08-01 NOTE — Progress Notes (Signed)
Patient presents to office today for stat bhcg following up from MAU visit on 12/8. Patient states pain has improved since then and is only mild cramping now. Denies bleeding. Discussed with patient we are monitoring your bhcg levels today, results take approximately 2 hours to finalize and will be reviewed with a provider in office. Advised patient we will then call you with results/updated plan of care. Patient verbalized understanding to all.  Reviewed results with Dr Alvester Morin who finds inappropriate rise in bhcg levels, though could still be normal progressing pregnancy. Dr Alvester Morin ordered repeat serial bhcgs next week and repeat ultrasound in 10-14 days.   Called patient & informed her of results. Scheduled lab appts for next week on 12/12 & 12/14. Scheduled follow up ultrasound on 12/19. Concerning signs of when to return to MAU reviewed with patient. Patient verbalized understanding to all.  Chase Caller RN BSN 08/01/21

## 2021-08-05 ENCOUNTER — Other Ambulatory Visit: Payer: Medicaid Other

## 2021-08-05 ENCOUNTER — Other Ambulatory Visit: Payer: Self-pay

## 2021-08-05 DIAGNOSIS — O3680X0 Pregnancy with inconclusive fetal viability, not applicable or unspecified: Secondary | ICD-10-CM

## 2021-08-06 ENCOUNTER — Other Ambulatory Visit: Payer: Self-pay

## 2021-08-06 ENCOUNTER — Inpatient Hospital Stay (HOSPITAL_COMMUNITY)
Admission: AD | Admit: 2021-08-06 | Discharge: 2021-08-06 | Disposition: A | Discharge status: Home / Self Care | Payer: Medicaid Other | Attending: Family Medicine | Admitting: Family Medicine

## 2021-08-06 ENCOUNTER — Inpatient Hospital Stay (HOSPITAL_COMMUNITY): Payer: Medicaid Other

## 2021-08-06 DIAGNOSIS — Z3A01 Less than 8 weeks gestation of pregnancy: Secondary | ICD-10-CM | POA: Insufficient documentation

## 2021-08-06 DIAGNOSIS — O209 Hemorrhage in early pregnancy, unspecified: Secondary | ICD-10-CM | POA: Insufficient documentation

## 2021-08-06 DIAGNOSIS — O3680X0 Pregnancy with inconclusive fetal viability, not applicable or unspecified: Secondary | ICD-10-CM | POA: Diagnosis not present

## 2021-08-06 LAB — BETA HCG QUANT (REF LAB): hCG Quant: 4892 m[IU]/mL

## 2021-08-06 NOTE — MAU Provider Note (Deleted)
History     CSN: [id number]  Arrival date and time: 08/06/21 1355   Event Date/Time   First Provider Initiated Contact with Patient 08/06/21 1447      Chief Complaint  Patient presents with   Vaginal Bleeding   HPI Kara Fernandez is a 30 y.o. [id number] at [redacted]w[redacted]d by LMP who presents to MAU for spotting. Patient reports she went to the bathroom about 1 hour prior to arrival and noticed some blood in the toilet and when she wiped. She came straight over but has since not seen any bleeding. She denies abdominal pain or cramping.   OB History     Gravida  4   Para  2   Term  2   Preterm      AB  1   Living  2      SAB      IAB  1   Ectopic      Multiple  0   Live Births  2           Past Medical History:  Diagnosis Date   Abnormal Pap smear    completed  at age 69   Angina ~2000   enlarged heart, pain due to stress   ASCUS on Pap smear    C&B 10/09 W/LGSIL   Asthma    as a child    Chlamydia infection    POSITIVE 10/06,8/07,7/09,11/11 ;at age 54   GC (gonococcus infection)    Gonorrhea    POSITIVE 8/06, 10/06; at age 31   H/O bacterial infection    frequently   H/O multiple allergies    H/O varicella    History of high blood pressure    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   Hypertension    not while rpegnant   Ovarian cyst    Ovarian cyst    Trichomonal vulvovaginitis    UTI (lower urinary tract infection)    Vaginal Pap smear, abnormal    Yeast infection     Past Surgical History:  Procedure Laterality Date   CESAREAN SECTION  12/29/2011   Procedure: CESAREAN SECTION;  Surgeon: Purcell Nails, MD;  Location: [facility];  Service: Gynecology;  Laterality: N/A;   CESAREAN SECTION N/A 02/26/2015   Procedure: CESAREAN SECTION;  Surgeon: Osborn Coho, MD;  Location: [facility];  Service: Obstetrics;  Laterality: N/A;    DILATION AND CURETTAGE OF UTERUS     abortion    Family History  Problem Relation Age of Onset   Hypertension Mother    Hypertension Father    Hypertension Maternal Grandmother    Stroke Maternal Grandmother    Hypertension Maternal Grandfather    Hypertension Maternal Aunt    Hypertension Maternal Uncle    Alcohol abuse Maternal Uncle    Alcohol abuse Paternal Uncle     Social History   Tobacco Use   Smoking status: Former   Smokeless tobacco: Never  Building services engineer Use: Former  Substance Use Topics   Alcohol use: Yes    Comment: occasionally; "i drink on weekends"   Drug use: Yes    Types: Marijuana    Comment: 08/24/13    Allergies: No Known Allergies  Medications Prior to Admission  Medication Sig Dispense Refill Last Dose   acetaZOLAMIDE (DIAMOX) 250  MG tablet Take 500 mg by mouth daily.      hydrOXYzine (ATARAX/VISTARIL) 25 MG tablet Take 0.5-1 tablets (12.5-25 mg total) by mouth every 8 (eight) hours as needed. 30 tablet 0    omeprazole (PRILOSEC) 20 MG capsule Take 1 capsule (20 mg total) by mouth daily. (Patient taking differently: Take 20 mg by mouth as needed (reflux). ) 30 capsule 0    topiramate (TOPAMAX) 25 MG tablet Start with 25 mg daily, then increase to 50 after 7 days. 40 tablet 0    valACYclovir (VALTREX) 500 MG tablet Take 1 tablet (500 mg total) by mouth 2 (two) times daily as needed (for breakouts). (Patient not taking: Reported on 10/22/2018) 6 tablet 11     Review of Systems  Constitutional: Negative.   Respiratory: Negative.    Cardiovascular: Negative.   Gastrointestinal: Negative.   Genitourinary:  Positive for vaginal bleeding.  Musculoskeletal: Negative.   Neurological: Negative.   Physical Exam   Blood pressure 110/77, pulse 74, temperature 98.1 F (36.7 C), temperature source Oral, resp. rate 18, height 5\' 1"  (1.549 m), weight 77.8 kg, last menstrual period 07/01/2021, SpO2 100 %, unknown if currently breastfeeding.  Physical  Exam Constitutional:      General: She is not in acute distress.    Appearance: She is obese.  Eyes:     Pupils: Pupils are equal, round, and reactive to light.  Cardiovascular:     Rate and Rhythm: Normal rate.  Pulmonary:     Effort: Pulmonary effort is normal.  Abdominal:     Palpations: Abdomen is soft.     Tenderness: There is no abdominal tenderness.  Musculoskeletal:        General: Normal range of motion.  Skin:    General: Skin is warm and dry.  Neurological:     General: No focal deficit present.     Mental Status: She is alert and oriented to person, place, and time.  Psychiatric:        Mood and Affect: Mood normal.        Behavior: Behavior normal.        Thought Content: Thought content normal.        Judgment: Judgment normal.   13/02/2021 OB LESS THAN 14 WEEKS WITH OB TRANSVAGINAL  Result Date: 08/06/2021 CLINICAL DATA:  Vaginal bleeding EXAM: OBSTETRIC <14 WK 08/08/2021 AND TRANSVAGINAL OB US TECHNIQUE: Both transabdominal and transvaginal ultrasound examinations were performed for complete evaluation of the gestation as well as the maternal uterus, adnexal regions, and pelvic cul-de-sac. Transvaginal technique was performed to assess early pregnancy. COMPARISON:  ultrasound dated July 30, 2021 FINDINGS: Intrauterine gestational sac: Single Yolk sac:  Not Visualized. Embryo:  Not Visualized. Cardiac Activity: Not Visualized. Heart Rate: N/A MSD: 6.7  mm   5 w   2  d Subchorionic hemorrhage:  Small amount of subchorionic hemorrhage. Maternal uterus/adnexae: Normal appearance of the bilateral ovaries. Probable corpus luteum cyst within the right ovary. IMPRESSION: 1. Probable early intrauterine gestational sac, but no yolk sac, fetal pole, or cardiac activity yet visualized. Recommend follow-up quantitative B-HCG levels and follow-up August 01, 2021 in 14 days to assess viability. This recommendation follows SRU consensus guidelines: Diagnostic Criteria for Nonviable Pregnancy Early in the First  Trimester. [place] Med 20132014. 2. Small amount of subchorionic hemorrhage. Electronically Signed   By: ; [id number] M.D.   On: 08/06/2021 16:58    MAU Course  Procedures 08/08/2021  MDM D/w Dr. [place]. Patient okay for  discharge and to follow up at [facility] tomorrow for f/u bhcg.   Assessment and Plan  Pregnancy of unknown anatomic location Vaginal bleeding affecting early pregnancy Subchorionic hematoma  - Discharge home in stable condition - Recommend pelvic rest - Keep appt as scheduled tomorrow 12/14 for repeat bhcg; has ultrasound scheduled next week - Return to MAU as needed or for worsening symptoms   Brand Males, CNM 08/06/2021, 5:43 PM

## 2021-08-06 NOTE — MAU Provider Note (Signed)
History     CSN: [id number]  Arrival date and time: 08/06/21 1355   Event Date/Time   First Provider Initiated Contact with Patient 08/06/21 1447      Chief Complaint  Patient presents with   Vaginal Bleeding   HPI Kara Fernandez is a 30 y.o. [id number] at [redacted]w[redacted]d by LMP who presents to MAU for spotting. Patient reports she went to the bathroom about 1 hour prior to arrival and noticed some blood in the toilet and when she wiped. She came straight over but has since not seen any bleeding. She denies abdominal pain or cramping.   OB History     Gravida  4   Para  2   Term  2   Preterm      AB  1   Living  2      SAB      IAB  1   Ectopic      Multiple  0   Live Births  2           Past Medical History:  Diagnosis Date   Abnormal Pap smear    completed  at age 69   Angina ~2000   enlarged heart, pain due to stress   ASCUS on Pap smear    C&B 10/09 W/LGSIL   Asthma    as a child    Chlamydia infection    POSITIVE 10/06,8/07,7/09,11/11 ;at age 54   GC (gonococcus infection)    Gonorrhea    POSITIVE 8/06, 10/06; at age 31   H/O bacterial infection    frequently   H/O multiple allergies    H/O varicella    History of high blood pressure    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   Hypertension    not while rpegnant   Ovarian cyst    Ovarian cyst    Trichomonal vulvovaginitis    UTI (lower urinary tract infection)    Vaginal Pap smear, abnormal    Yeast infection     Past Surgical History:  Procedure Laterality Date   CESAREAN SECTION  12/29/2011   Procedure: CESAREAN SECTION;  Surgeon: Purcell Nails, MD;  Location: [facility];  Service: Gynecology;  Laterality: N/A;   CESAREAN SECTION N/A 02/26/2015   Procedure: CESAREAN SECTION;  Surgeon: Osborn Coho, MD;  Location: [facility];  Service: Obstetrics;  Laterality: N/A;    DILATION AND CURETTAGE OF UTERUS     abortion    Family History  Problem Relation Age of Onset   Hypertension Mother    Hypertension Father    Hypertension Maternal Grandmother    Stroke Maternal Grandmother    Hypertension Maternal Grandfather    Hypertension Maternal Aunt    Hypertension Maternal Uncle    Alcohol abuse Maternal Uncle    Alcohol abuse Paternal Uncle     Social History   Tobacco Use   Smoking status: Former   Smokeless tobacco: Never  Building services engineer Use: Former  Substance Use Topics   Alcohol use: Yes    Comment: occasionally; "i drink on weekends"   Drug use: Yes    Types: Marijuana    Comment: 08/24/13    Allergies: No Known Allergies  Medications Prior to Admission  Medication Sig Dispense Refill Last Dose   acetaZOLAMIDE (DIAMOX) 250  MG tablet Take 500 mg by mouth daily.      hydrOXYzine (ATARAX/VISTARIL) 25 MG tablet Take 0.5-1 tablets (12.5-25 mg total) by mouth every 8 (eight) hours as needed. 30 tablet 0    omeprazole (PRILOSEC) 20 MG capsule Take 1 capsule (20 mg total) by mouth daily. (Patient taking differently: Take 20 mg by mouth as needed (reflux). ) 30 capsule 0    topiramate (TOPAMAX) 25 MG tablet Start with 25 mg daily, then increase to 50 after 7 days. 40 tablet 0    valACYclovir (VALTREX) 500 MG tablet Take 1 tablet (500 mg total) by mouth 2 (two) times daily as needed (for breakouts). (Patient not taking: Reported on 10/22/2018) 6 tablet 11     Review of Systems  Constitutional: Negative.   Respiratory: Negative.    Cardiovascular: Negative.   Gastrointestinal: Negative.   Genitourinary:  Positive for vaginal bleeding.  Musculoskeletal: Negative.   Neurological: Negative.   Physical Exam   Blood pressure 110/77, pulse 74, temperature 98.1 F (36.7 C), temperature source Oral, resp. rate 18, height 5\' 1"  (1.549 m), weight 77.8 kg, last menstrual period 07/01/2021, SpO2 100 %, unknown if currently breastfeeding.  Physical  Exam Constitutional:      General: She is not in acute distress.    Appearance: She is obese.  Eyes:     Pupils: Pupils are equal, round, and reactive to light.  Cardiovascular:     Rate and Rhythm: Normal rate.  Pulmonary:     Effort: Pulmonary effort is normal.  Abdominal:     Palpations: Abdomen is soft.     Tenderness: There is no abdominal tenderness.  Musculoskeletal:        General: Normal range of motion.  Skin:    General: Skin is warm and dry.  Neurological:     General: No focal deficit present.     Mental Status: She is alert and oriented to person, place, and time.  Psychiatric:        Mood and Affect: Mood normal.        Behavior: Behavior normal.        Thought Content: Thought content normal.        Judgment: Judgment normal.   13/02/2021 OB LESS THAN 14 WEEKS WITH OB TRANSVAGINAL  Result Date: 08/06/2021 CLINICAL DATA:  Vaginal bleeding EXAM: OBSTETRIC <14 WK 08/08/2021 AND TRANSVAGINAL OB US TECHNIQUE: Both transabdominal and transvaginal ultrasound examinations were performed for complete evaluation of the gestation as well as the maternal uterus, adnexal regions, and pelvic cul-de-sac. Transvaginal technique was performed to assess early pregnancy. COMPARISON:  ultrasound dated July 30, 2021 FINDINGS: Intrauterine gestational sac: Single Yolk sac:  Not Visualized. Embryo:  Not Visualized. Cardiac Activity: Not Visualized. Heart Rate: N/A MSD: 6.7  mm   5 w   2  d Subchorionic hemorrhage:  Small amount of subchorionic hemorrhage. Maternal uterus/adnexae: Normal appearance of the bilateral ovaries. Probable corpus luteum cyst within the right ovary. IMPRESSION: 1. Probable early intrauterine gestational sac, but no yolk sac, fetal pole, or cardiac activity yet visualized. Recommend follow-up quantitative B-HCG levels and follow-up August 01, 2021 in 14 days to assess viability. This recommendation follows SRU consensus guidelines: Diagnostic Criteria for Nonviable Pregnancy Early in the First  Trimester. [place] Med 20132014. 2. Small amount of subchorionic hemorrhage. Electronically Signed   By: ; [id number] M.D.   On: 08/06/2021 16:58    MAU Course  Procedures 08/08/2021  MDM D/w Dr. [place]. Patient okay for  discharge and to follow up at [facility] tomorrow for f/u bhcg.   Assessment and Plan  Pregnancy of unknown anatomic location Vaginal bleeding affecting early pregnancy Subchorionic hematoma   - Discharge home in stable condition - Recommend pelvic rest - Keep appt as scheduled tomorrow 12/14 for repeat bhcg; has ultrasound scheduled next week - Return to MAU as needed or for worsening symptoms    Brand Males, CNM 08/06/2021, 5:47 PM

## 2021-08-06 NOTE — MAU Note (Signed)
Presents with c/o VB, states after voiding noticed blood in commode & had spotting with wiping afterwards.  Denies abdominal pain/cramping.

## 2021-08-07 ENCOUNTER — Other Ambulatory Visit: Payer: Medicaid Other

## 2021-08-07 DIAGNOSIS — O3680X0 Pregnancy with inconclusive fetal viability, not applicable or unspecified: Secondary | ICD-10-CM

## 2021-08-08 LAB — BETA HCG QUANT (REF LAB): hCG Quant: 9330 m[IU]/mL

## 2021-08-08 NOTE — Progress Notes (Signed)
Attestation of Attending Supervision of clinical support staff: I agree with the care provided to this patient and was available for any consultation.  I have reviewed the RN's note and chart. I was available for consult and to see the patient if needed.   Ace Bergfeld MD MPH Attending Physician Faculty Practice- [facility]  

## 2021-08-12 ENCOUNTER — Ambulatory Visit
Admission: RE | Admit: 2021-08-12 | Discharge: 2021-08-12 | Disposition: A | Discharge status: Home / Self Care | Payer: Medicaid Other | Source: Ambulatory Visit | Attending: Family Medicine | Admitting: Family Medicine

## 2021-08-12 ENCOUNTER — Ambulatory Visit (INDEPENDENT_AMBULATORY_CARE_PROVIDER_SITE_OTHER): Payer: Self-pay | Admitting: *Deleted

## 2021-08-12 ENCOUNTER — Encounter: Payer: Self-pay | Admitting: *Deleted

## 2021-08-12 ENCOUNTER — Other Ambulatory Visit: Payer: Self-pay

## 2021-08-12 VITALS — BP 114/58 | HR 79 | Ht 61.0 in | Wt 166.7 lb

## 2021-08-12 DIAGNOSIS — O3680X0 Pregnancy with inconclusive fetal viability, not applicable or unspecified: Secondary | ICD-10-CM | POA: Diagnosis not present

## 2021-08-12 DIAGNOSIS — O219 Vomiting of pregnancy, unspecified: Secondary | ICD-10-CM

## 2021-08-12 DIAGNOSIS — Z3480 Encounter for supervision of other normal pregnancy, unspecified trimester: Secondary | ICD-10-CM | POA: Insufficient documentation

## 2021-08-12 MED ORDER — PROMETHAZINE HCL 25 MG PO TABS: 25.0000 mg | ORAL_TABLET | Freq: Four times a day (QID) | ORAL | 0 refills | Status: DC | PRN

## 2021-08-12 NOTE — Progress Notes (Signed)
Here for [place] results. Reviewed with Dr. Vergie Living and informed patient US shows live baby [redacted]w[redacted]d with EDD 04/07/22 with small subchorionic hemorrhage.  Advised if has bleeding or severe pain to go to MAU.  Advised to start prenatal care with provider of her choice. Reviewed her prenatal care options. She elects [facility]. Sent to registar to schedule ob intake and new ob visits. C/o nausea and vomiting. Phenergan ordered per protocol.  Yariana Hoaglund,RN

## 2021-08-12 NOTE — Patient Instructions (Signed)
Prenatal Care Providers           [facility]  [address] [phone]  [facility]   [address]  [phone]  [facility] @ [place]       [address] [phone]            [facility] @ [place]     [address] [phone]          [facility] @ [place]   [address] [phone]  [facility]  [address] [phone]     [facility]  [address]   [phone]     [facility]  Phone: [phone]  [facility]  Phone: [phone]  [facility] Phone: [phone]  [facility] Phone: [phone]  [facility] Phone: [phone]  [facility] Phone: [phone]  [facility]  Phone: [phone]  

## 2021-08-13 ENCOUNTER — Inpatient Hospital Stay (HOSPITAL_COMMUNITY)
Admission: AD | Admit: 2021-08-13 | Discharge: 2021-08-13 | Disposition: A | Discharge status: Home / Self Care | Payer: Medicaid Other | Attending: Obstetrics and Gynecology | Admitting: Obstetrics and Gynecology

## 2021-08-13 ENCOUNTER — Telehealth: Payer: Self-pay

## 2021-08-13 ENCOUNTER — Other Ambulatory Visit: Payer: Self-pay | Admitting: Family Medicine

## 2021-08-13 ENCOUNTER — Encounter (HOSPITAL_COMMUNITY): Payer: Self-pay | Admitting: Obstetrics and Gynecology

## 2021-08-13 ENCOUNTER — Other Ambulatory Visit: Payer: Self-pay

## 2021-08-13 DIAGNOSIS — O3680X0 Pregnancy with inconclusive fetal viability, not applicable or unspecified: Secondary | ICD-10-CM

## 2021-08-13 DIAGNOSIS — O4690 Antepartum hemorrhage, unspecified, unspecified trimester: Secondary | ICD-10-CM

## 2021-08-13 DIAGNOSIS — O219 Vomiting of pregnancy, unspecified: Secondary | ICD-10-CM

## 2021-08-13 DIAGNOSIS — Z87891 Personal history of nicotine dependence: Secondary | ICD-10-CM | POA: Insufficient documentation

## 2021-08-13 DIAGNOSIS — O210 Mild hyperemesis gravidarum: Secondary | ICD-10-CM | POA: Insufficient documentation

## 2021-08-13 DIAGNOSIS — O98311 Other infections with a predominantly sexual mode of transmission complicating pregnancy, first trimester: Secondary | ICD-10-CM | POA: Insufficient documentation

## 2021-08-13 DIAGNOSIS — A599 Trichomoniasis, unspecified: Secondary | ICD-10-CM | POA: Diagnosis not present

## 2021-08-13 DIAGNOSIS — Z3A01 Less than 8 weeks gestation of pregnancy: Secondary | ICD-10-CM | POA: Insufficient documentation

## 2021-08-13 LAB — URINALYSIS, ROUTINE W REFLEX MICROSCOPIC
Bilirubin Urine: NEGATIVE
Glucose, UA: NEGATIVE mg/dL
Ketones, ur: 80 mg/dL — AB
Nitrite: NEGATIVE
Protein, ur: 30 mg/dL — AB
Specific Gravity, Urine: 1.032 — ABNORMAL HIGH (ref 1.005–1.030)
pH: 5 (ref 5.0–8.0)

## 2021-08-13 LAB — WET PREP, GENITAL
Clue Cells Wet Prep HPF POC: NONE SEEN
WBC, Wet Prep HPF POC: <10 (ref <10)
Yeast Wet Prep HPF POC: NONE SEEN

## 2021-08-13 MED ORDER — ONDANSETRON 8 MG PO TBDP: 8.0000 mg | ORAL_TABLET | Freq: Three times a day (TID) | ORAL | 2 refills | Status: DC | PRN

## 2021-08-13 MED ORDER — PROCHLORPERAZINE MALEATE 10 MG PO TABS: 10.0000 mg | ORAL_TABLET | Freq: Two times a day (BID) | ORAL | 2 refills | Status: DC | PRN

## 2021-08-13 MED ORDER — PROMETHAZINE HCL 25 MG PO TABS: 25.0000 mg | ORAL_TABLET | Freq: Four times a day (QID) | ORAL | 2 refills | Status: DC | PRN

## 2021-08-13 MED ORDER — ONDANSETRON HCL 4 MG/2ML IJ SOLN
4.0000 mg | Freq: Once | INTRAMUSCULAR | Status: AC
  Administered 2021-08-13: 11:00:00 4 mg via INTRAVENOUS
  Filled 2021-08-13: qty 2

## 2021-08-13 MED ORDER — FAMOTIDINE 20 MG PO TABS: 20.0000 mg | ORAL_TABLET | Freq: Two times a day (BID) | ORAL | 4 refills | Status: DC

## 2021-08-13 MED ORDER — METRONIDAZOLE IVPB CUSTOM
2000.0000 mg | Freq: Once | INTRAVENOUS | Status: AC
  Administered 2021-08-13: 12:00:00 2000 mg via INTRAVENOUS
  Filled 2021-08-13: qty 400

## 2021-08-13 MED ORDER — M.V.I. ADULT IV INJ
Freq: Once | INTRAVENOUS | Status: AC
  Filled 2021-08-13: qty 10

## 2021-08-13 MED ORDER — PROCHLORPERAZINE EDISYLATE 10 MG/2ML IJ SOLN
10.0000 mg | Freq: Once | INTRAMUSCULAR | Status: AC
  Administered 2021-08-13: 09:00:00 10 mg via INTRAVENOUS
  Filled 2021-08-13: qty 2

## 2021-08-13 MED ORDER — LACTATED RINGERS IV BOLUS
1000.0000 mL | Freq: Once | INTRAVENOUS | Status: AC
  Administered 2021-08-13: 09:00:00 1000 mL via INTRAVENOUS

## 2021-08-13 MED ORDER — METRONIDAZOLE IVPB CUSTOM: 2000.0000 mg | Freq: Two times a day (BID) | INTRAVENOUS | Status: DC

## 2021-08-13 MED ORDER — FAMOTIDINE IN NACL 20-0.9 MG/50ML-% IV SOLN
20.0000 mg | Freq: Once | INTRAVENOUS | Status: AC
  Administered 2021-08-13: 09:00:00 20 mg via INTRAVENOUS
  Filled 2021-08-13: qty 50

## 2021-08-13 MED ORDER — METRONIDAZOLE 500 MG PO TABS
2000.0000 mg | ORAL_TABLET | Freq: Once | ORAL | Status: DC
  Filled 2021-08-13: qty 4

## 2021-08-13 NOTE — MAU Note (Signed)
Patient reports N/V with constipation.  Last intake of solid food was 08/11/21 and Liquids of Gatorade 12/20.  This resulted in vomiting.  Last BM 08/11/21.  Patient states she is weak and at times dizzy.

## 2021-08-13 NOTE — Discharge Instructions (Signed)

## 2021-08-13 NOTE — Telephone Encounter (Addendum)
-----   Message from Federico Flake, MD sent at 08/13/2021 12:26 PM EST ----- US shows progression of pregnancy-- date pregnancy by this [place]. + FHR but it is low. I recommend another viability [place] in 14 days to assure HR rises appropriately. [facility] present and low HR are risk factors for early pregnancy loss.    Called pt; mailbox is full. Result note sent by provider to patient. Will attempt to contact patient a second time via telephone.

## 2021-08-13 NOTE — MAU Provider Note (Signed)
History     CSN: [id number]  Arrival date and time: 08/13/21 8676   Event Date/Time   First Provider Initiated Contact with Patient 08/13/21 0902      Chief Complaint  Patient presents with   Weakness   HPI Kara Fernandez is a 30 y.o. [id number] at [redacted]w[redacted]d who presents with nausea and vomiting. She states she hasn't been able to keep anything down for 2 days. She tried phenergan but it makes her too sleepy to take regularly. She denies any bleeding or leaking. Denies any pain. She also reports constipation.   OB History     Gravida  4   Para  2   Term  2   Preterm      AB  1   Living  2      SAB      IAB  1   Ectopic      Multiple  0   Live Births  2           Past Medical History:  Diagnosis Date   Abnormal Pap smear    completed  at age 14   Angina ~2000   enlarged heart, pain due to stress   ASCUS on Pap smear    C&B 10/09 W/LGSIL   Asthma    as a child    Chlamydia infection    POSITIVE 10/06,8/07,7/09,11/11 ;at age 29   GC (gonococcus infection)    Gonorrhea    POSITIVE 8/06, 10/06; at age 23   H/O bacterial infection    frequently   H/O multiple allergies    H/O varicella    History of high blood pressure    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   Hypertension    not while rpegnant   Ovarian cyst    Ovarian cyst    Trichomonal vulvovaginitis    UTI (lower urinary tract infection)    Vaginal Pap smear, abnormal    Yeast infection     Past Surgical History:  Procedure Laterality Date   CESAREAN SECTION  12/29/2011   Procedure: CESAREAN SECTION;  Surgeon: Purcell Nails, MD;  Location: [facility];  Service: Gynecology;  Laterality: N/A;   CESAREAN SECTION N/A 02/26/2015   Procedure: CESAREAN SECTION;  Surgeon: Osborn Coho, MD;  Location: [facility];  Service: Obstetrics;  Laterality: N/A;   DILATION AND  CURETTAGE OF UTERUS     abortion    Family History  Problem Relation Age of Onset   Hypertension Mother    Hypertension Father    Hypertension Maternal Grandmother    Stroke Maternal Grandmother    Hypertension Maternal Grandfather    Hypertension Maternal Aunt    Hypertension Maternal Uncle    Alcohol abuse Maternal Uncle    Alcohol abuse Paternal Uncle     Social History   Tobacco Use   Smoking status: Former   Smokeless tobacco: Never  Building services engineer Use: Former  Substance Use Topics   Alcohol use: Not Currently   Drug use: Yes    Types: Marijuana    Comment: 06/2021    Allergies: No Known Allergies  Medications Prior to Admission  Medication Sig Dispense Refill Last Dose   promethazine (PHENERGAN) 25 MG tablet Take 1 tablet (25 mg total) by mouth every 6 (six)  hours as needed for nausea or vomiting. 30 tablet 0 08/13/2021 at 0600   acetaminophen (TYLENOL) 325 MG tablet Take 650 mg by mouth every 6 (six) hours as needed.      acetaZOLAMIDE (DIAMOX) 250 MG tablet Take 500 mg by mouth daily. (Patient not taking: Reported on 08/12/2021)      hydrOXYzine (ATARAX/VISTARIL) 25 MG tablet Take 0.5-1 tablets (12.5-25 mg total) by mouth every 8 (eight) hours as needed. (Patient not taking: Reported on 08/12/2021) 30 tablet 0    omeprazole (PRILOSEC) 20 MG capsule Take 1 capsule (20 mg total) by mouth daily. (Patient taking differently: Take 20 mg by mouth as needed (reflux). ) 30 capsule 0    Prenatal MV & Min w/FA-DHA (PRENATAL GUMMIES PO) Take 2 tablets by mouth daily.   Unknown   topiramate (TOPAMAX) 25 MG tablet Start with 25 mg daily, then increase to 50 after 7 days. (Patient not taking: Reported on 08/12/2021) 40 tablet 0    valACYclovir (VALTREX) 500 MG tablet Take 1 tablet (500 mg total) by mouth 2 (two) times daily as needed (for breakouts). (Patient not taking: Reported on 10/22/2018) 6 tablet 11     Review of Systems  Constitutional: Negative.  Negative for  fatigue and fever.  HENT: Negative.    Respiratory: Negative.  Negative for shortness of breath.   Cardiovascular: Negative.  Negative for chest pain.  Gastrointestinal:  Positive for nausea and vomiting. Negative for abdominal pain, constipation and diarrhea.  Genitourinary: Negative.  Negative for dysuria, vaginal bleeding and vaginal discharge.  Neurological: Negative.  Negative for dizziness and headaches.  Physical Exam   Blood pressure (!) 115/58, pulse 70, temperature 98.1 F (36.7 C), temperature source Oral, resp. rate 18, height 5\' 1"  (1.549 m), weight 74.3 kg, last menstrual period 07/01/2021, SpO2 99 %, unknown if currently breastfeeding.  Physical Exam Vitals and nursing note reviewed.  Constitutional:      General: She is not in acute distress.    Appearance: She is well-developed.  HENT:     Head: Normocephalic.  Eyes:     Pupils: Pupils are equal, round, and reactive to light.  Cardiovascular:     Rate and Rhythm: Normal rate and regular rhythm.     Heart sounds: Normal heart sounds.  Pulmonary:     Effort: Pulmonary effort is normal. No respiratory distress.     Breath sounds: Normal breath sounds.  Abdominal:     General: Bowel sounds are normal. There is no distension.     Palpations: Abdomen is soft.     Tenderness: There is no abdominal tenderness.  Skin:    General: Skin is warm and dry.  Neurological:     Mental Status: She is alert and oriented to person, place, and time.  Psychiatric:        Mood and Affect: Mood normal.        Behavior: Behavior normal.        Thought Content: Thought content normal.        Judgment: Judgment normal.    MAU Course  Procedures Results for orders placed or performed during the hospital encounter of 08/13/21 (from the past 24 hour(s))  Urinalysis, Routine w reflex microscopic Urine, Clean Catch     Status: Abnormal   Collection Time: 08/13/21  9:04 AM  Result Value Ref Range   Color, Urine AMBER (A) YELLOW    APPearance CLOUDY (A) CLEAR   Specific Gravity, Urine 1.032 (H) 1.005 - 1.030  pH 5.0 5.0 - 8.0   Glucose, UA NEGATIVE NEGATIVE mg/dL   Hgb urine dipstick SMALL (A) NEGATIVE   Bilirubin Urine NEGATIVE NEGATIVE   Ketones, ur 80 (A) NEGATIVE mg/dL   Protein, ur 30 (A) NEGATIVE mg/dL   Nitrite NEGATIVE NEGATIVE   Leukocytes,Ua MODERATE (A) NEGATIVE   RBC / HPF 0-5 0 - 5 RBC/hpf   WBC, UA 6-10 0 - 5 WBC/hpf   Bacteria, UA RARE (A) NONE SEEN   Squamous Epithelial / LPF 21-50 0 - 5   Mucus PRESENT    Trichomonas, UA PRESENT (A) NONE SEEN  Wet prep, genital     Status: Abnormal   Collection Time: 08/13/21 11:18 AM   Specimen: Vaginal  Result Value Ref Range   Yeast Wet Prep HPF POC NONE SEEN NONE SEEN   Trich, Wet Prep PRESENT (A) NONE SEEN   Clue Cells Wet Prep HPF POC NONE SEEN NONE SEEN   WBC, Wet Prep HPF POC <10 <10   Sperm PRESENT     MDM UA LR bolus Pepcid Compazine Multivitamin Infusion  Reviewed results of urine test showing trichomoniasis. Options for treatment reviewed. Patient concerned that she will be unable to do prolonged course of flagyl due to nausea and vomiting. Discussed one time dose and patient agreeable to try with premedication. Recommended repeat vaginal swabs today and patient agreeable.   Wet prep and gc/chlamydia  Zofran IV Flagyl PO- unable to keep down. Discussed IV flagyl and risks of possible failure of treatment. Patient desires to try today.   IV flagyl  Assessment and Plan   1. Nausea and vomiting during pregnancy   2. Trichomoniasis   3. [redacted] weeks gestation of pregnancy    -Discharge home in stable condition -Rx for zofran, pepcid, compazine sent to patient's pharmacy -Constipation precautions discussed and encouraged selective use of zofran due to risk of worsening constipation -Patient advised to follow-up with OB as scheduled for prenatal care -Patient may return to MAU as needed or if her condition were to change or  worsen   Rolm Bookbinder CNM 08/13/2021, 9:02 AM

## 2021-08-13 NOTE — MAU Note (Signed)
Presents with c/o N/V and weakness.  Reports hasn't kept anything down in 2 days.  States urine has an odor and is dark in color.  Reports was prescribed Phenergan yesterday @ appt, last taken this morning @ 0600.

## 2021-08-14 LAB — GC/CHLAMYDIA PROBE AMP (~~LOC~~) NOT AT ARMC
Chlamydia: POSITIVE — AB
Comment: NEGATIVE
Comment: NORMAL
Neisseria Gonorrhea: NEGATIVE

## 2021-08-14 LAB — CULTURE, OB URINE

## 2021-08-14 NOTE — Addendum Note (Signed)
Addended by: Isabell Jarvis on: 08/14/2021 10:04 AM   Modules accepted: Orders

## 2021-08-14 NOTE — Telephone Encounter (Signed)
Call placed back to pt. Spoke with pt. Pt given results and recommendations per Dr Alvester Morin. Pt verbalized understanding and agreeable to plan of care.  [place] scheduled for 08/28/2021 at 10am. Pt agreeable to date and time of appt.  Judeth Cornfield, RN

## 2021-08-20 NOTE — Progress Notes (Signed)
Patient was assessed and managed by nursing staff during this encounter. I have reviewed the chart and agree with the documentation and plan. I have also made any necessary editorial changes.  Keyes Bing, MD 08/20/2021 11:51 AM

## 2021-08-28 ENCOUNTER — Ambulatory Visit
Admission: RE | Admit: 2021-08-28 | Discharge: 2021-08-28 | Disposition: A | Discharge status: Home / Self Care | Payer: Medicaid Other | Source: Ambulatory Visit | Attending: Family Medicine | Admitting: Family Medicine

## 2021-08-28 ENCOUNTER — Telehealth: Payer: Self-pay | Admitting: Medical

## 2021-08-28 ENCOUNTER — Other Ambulatory Visit: Payer: Self-pay

## 2021-08-28 ENCOUNTER — Other Ambulatory Visit: Payer: Self-pay | Admitting: Family Medicine

## 2021-08-28 DIAGNOSIS — O3680X0 Pregnancy with inconclusive fetal viability, not applicable or unspecified: Secondary | ICD-10-CM

## 2021-08-28 NOTE — Telephone Encounter (Signed)
I called Kara Fernandez today at 10:43 AM and confirmed patient's identity using two patient identifiers. [place] results from earlier today were reviewed. Patient is scheduled for new OB visit at [facility] on 09/19/21. First trimester warning signs reviewed. Patient voiced understanding and had no further questions. Due to small [facility], advised pelvic rest.   US OB Transvaginal  Result Date: 08/28/2021 CLINICAL DATA:  Pregnancy of unknown anatomic location, assess growth and viability; LMP 07/01/2021; G4P2A1 EXAM: TRANSVAGINAL OB ULTRASOUND TECHNIQUE: Transvaginal ultrasound was performed for complete evaluation of the gestation as well as the maternal uterus, adnexal regions, and pelvic cul-de-sac. COMPARISON:  08/12/2021 FINDINGS: Intrauterine gestational sac: Present, single Yolk sac:  Present Embryo:  Present Cardiac Activity: Present Heart Rate: 183 bpm CRL:   19.2 mm   8 w 3 d                  [place] EDC: 04/06/2022 Subchorionic hemorrhage:  Small subchronic hemorrhage Maternal uterus/adnexae: Uterus retroverted, otherwise unremarkable. Neither ovary visualized. No free pelvic fluid or adnexal masses. IMPRESSION: Single live intrauterine gestation at 8 weeks 3 days EGA by crown-rump length. Small subchronic hemorrhage. Nonvisualization of ovaries. Electronically Signed   By: Ulyses Southward M.D.   On: 08/28/2021 10:36    Marny Lowenstein, PA-C 08/28/2021 10:43 AM

## 2021-09-10 ENCOUNTER — Telehealth (INDEPENDENT_AMBULATORY_CARE_PROVIDER_SITE_OTHER): Payer: Self-pay

## 2021-09-10 DIAGNOSIS — Z3480 Encounter for supervision of other normal pregnancy, unspecified trimester: Secondary | ICD-10-CM

## 2021-09-10 DIAGNOSIS — B009 Herpesviral infection, unspecified: Secondary | ICD-10-CM

## 2021-09-10 DIAGNOSIS — O0990 Supervision of high risk pregnancy, unspecified, unspecified trimester: Secondary | ICD-10-CM

## 2021-09-10 DIAGNOSIS — Z3A00 Weeks of gestation of pregnancy not specified: Secondary | ICD-10-CM

## 2021-09-10 DIAGNOSIS — I10 Essential (primary) hypertension: Secondary | ICD-10-CM | POA: Insufficient documentation

## 2021-09-10 DIAGNOSIS — M940 Chondrocostal junction syndrome [Tietze]: Secondary | ICD-10-CM

## 2021-09-10 MED ORDER — BLOOD PRESSURE MONITORING DEVI: 1.0000 | 0 refills | Status: DC

## 2021-09-10 NOTE — Progress Notes (Signed)
New OB Intake  I connected with  Kara Fernandez on 09/10/21 at 10:15 AM EST by telephone Video Visit and verified that I am speaking with the correct person using two identifiers. Nurse is located at [facility] and pt is located at home.  I discussed the limitations, risks, security and privacy concerns of performing an evaluation and management service by telephone and the availability of in person appointments. I also discussed with the patient that there may be a patient responsible charge related to this service. The patient expressed understanding and agreed to proceed.  I explained I am completing New OB Intake today. We discussed her EDD of 04/07/22 that is based on LMP of 07/01/21. Pt is G4/P2. I reviewed her allergies, medications, Medical/Surgical/OB history, and appropriate screenings. I informed her of [facility] services. Based on history, this is a/an  pregnancy uncomplicated .   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   MyChart/Babyscripts MyChart access verified. I explained pt will have some visits in office and some virtually. Babyscripts instructions given and order placed. Patient verifies receipt of registration text/e-mail. Account successfully created and app downloaded.  Blood Pressure Cuff  Blood pressure cuff ordered for patient to pick-up from [employer]. Explained after first prenatal appt pt  will check weekly and document in Babyscripts.  Weight scale: Patient does / does not  have weight scale. Weight scale ordered for patient to pick up from [employer].   Anatomy US Explained first scheduled [place] will be around 19 weeks. Anatomy US scheduled for 11/12/21 at 08:15 a. Pt notified to arrive at 08:00 a.  Labs Discussed Avelina Laine genetic screening with patient. Would like both Panorama and Horizon drawn at new OB visit. Routine prenatal labs needed.  Covid Vaccine Patient has covid vaccine.   CenteringPregnancy Candidate?  If yes, offer as possibility  Mother/ Baby Dyad Candidate?    If yes, offer as possibility  Informed patient of [facility] website  and placed link in her AVS.   Social Determinants of Health Food Insecurity: Patient denies food insecurity. WIC Referral: Patient is interested in referral to [facility].  Transportation: Patient denies transportation needs. Childcare: Discussed no children allowed at ultrasound appointments. Offered childcare services; patient declines childcare services at this time.  Send link to Pregnancy Navigators   Placed OB Box on problem list and updated  First visit review I reviewed new OB appt with pt. I explained she will have a pelvic exam, ob bloodwork with genetic screening, and PAP smear. Explained pt will be seen by Luna Kitchens, CNM at first visit; encounter routed to appropriate provider. Explained that patient will be seen by pregnancy navigator following visit with provider. [facility] information placed in AVS.   Henrietta Dine, CMA 09/10/2021  10:31 AM

## 2021-09-10 NOTE — Patient Instructions (Signed)
  At our [facility], we work as an integrated team, providing care to address both physical and emotional health. Your medical provider may refer you to see our Behavioral Health Clinician (BHC) on the same day you see your medical provider, as availability permits; often scheduled virtually at your convenience.  Our BHC is available to all patients, visits generally last between 20-30 minutes, but can be longer or shorter, depending on patient need. The BHC offers help with stress management, coping with symptoms of depression and anxiety, major life changes , sleep issues, changing risky behavior, grief and loss, life stress, working on personal life goals, and  behavioral health issues, as these all affect your overall health and wellness.  The BHC is NOT available for the following: FMLA paperwork, court-ordered evaluations, specialty assessments (custody or disability), letters to employers, or obtaining certification for an emotional support animal. The BHC does not provide long-term therapy. You have the right to refuse integrated behavioral health services, or to reschedule to see the BHC at a later date.  Confidentiality exception: If it is suspected that a child or disabled adult is being abused or neglected, we are required by law to report that to either Child Protective Services or Adult Protective Services.  If you have a diagnosis of Bipolar affective disorder, Schizophrenia, or recurrent Major depressive disorder, we will recommend that you establish care with a psychiatrist, as these are lifelong, chronic conditions, and we want your overall emotional health and medications to be more closely monitored. If you anticipate needing extended maternity leave due to mental health issues postpartum, it it recommended you inform your medical provider, so we can put in a referral to a psychiatrist as soon as possible. The BHC is unable to recommend an extended maternity leave for mental  health issues. Your medical provider or BHC may refer you to a therapist for ongoing, traditional therapy, or to a psychiatrist, for medication management, if it would benefit your overall health. Depending on your insurance, you may have a copay or be charged a deductible, depending on your insurance, to see the BHC. If you are uninsured, it is recommended that you apply for financial assistance. (Forms may be requested at the front desk for in-person visits, via MyChart, or request a form during a virtual visit).  If you see the BHC more than 6 times, you will have to complete a comprehensive clinical assessment interview with the BHC to resume integrated services.  For virtual visits with the BHC, you must be physically in the state of [place] at the time of the visit. For example, if you live in [place], you will have to do an in-person visit with the BHC, and your out-of-state insurance may not cover behavioral health services in [place]. If you are going out of the state or country for any reason, the BHC may see you virtually when you return to [place], but not while you are physically outside of .    

## 2021-09-19 ENCOUNTER — Other Ambulatory Visit (HOSPITAL_COMMUNITY)
Admission: RE | Admit: 2021-09-19 | Discharge: 2021-09-19 | Disposition: A | Discharge status: Home / Self Care | Payer: Medicaid Other | Source: Ambulatory Visit | Attending: Student | Admitting: Student

## 2021-09-19 ENCOUNTER — Other Ambulatory Visit: Payer: Self-pay

## 2021-09-19 ENCOUNTER — Ambulatory Visit (INDEPENDENT_AMBULATORY_CARE_PROVIDER_SITE_OTHER): Payer: Self-pay | Admitting: Student

## 2021-09-19 VITALS — BP 125/73 | HR 88 | Wt 166.7 lb

## 2021-09-19 DIAGNOSIS — Z3480 Encounter for supervision of other normal pregnancy, unspecified trimester: Secondary | ICD-10-CM | POA: Diagnosis not present

## 2021-09-19 DIAGNOSIS — A749 Chlamydial infection, unspecified: Secondary | ICD-10-CM | POA: Diagnosis present

## 2021-09-19 DIAGNOSIS — G932 Benign intracranial hypertension: Secondary | ICD-10-CM

## 2021-09-19 DIAGNOSIS — A599 Trichomoniasis, unspecified: Secondary | ICD-10-CM | POA: Insufficient documentation

## 2021-09-19 DIAGNOSIS — R011 Cardiac murmur, unspecified: Secondary | ICD-10-CM | POA: Insufficient documentation

## 2021-09-19 DIAGNOSIS — Z23 Encounter for immunization: Secondary | ICD-10-CM

## 2021-09-19 DIAGNOSIS — Z3A11 11 weeks gestation of pregnancy: Secondary | ICD-10-CM

## 2021-09-19 LAB — POCT URINALYSIS DIP (DEVICE)
Bilirubin Urine: NEGATIVE
Glucose, UA: NEGATIVE mg/dL
Hgb urine dipstick: NEGATIVE
Ketones, ur: NEGATIVE mg/dL
Leukocytes,Ua: NEGATIVE
Nitrite: NEGATIVE
Protein, ur: NEGATIVE mg/dL
Specific Gravity, Urine: 1.025 (ref 1.005–1.030)
Urobilinogen, UA: 0.2 mg/dL (ref 0.0–1.0)
pH: 7 (ref 5.0–8.0)

## 2021-09-19 MED ORDER — ACETAZOLAMIDE 250 MG PO TABS: 250.0000 mg | ORAL_TABLET | Freq: Two times a day (BID) | ORAL | 1 refills | Status: DC

## 2021-09-19 MED ORDER — AZITHROMYCIN 500 MG PO TABS: 1000.0000 mg | ORAL_TABLET | Freq: Every day | ORAL | Status: DC

## 2021-09-19 MED ORDER — ASPIRIN 81 MG PO CHEW: 81.0000 mg | CHEWABLE_TABLET | Freq: Every day | ORAL | 7 refills | Status: DC

## 2021-09-19 MED ORDER — AZITHROMYCIN 500 MG PO TABS
1000.0000 mg | ORAL_TABLET | Freq: Once | ORAL | Status: AC
  Administered 2021-09-19: 1000 mg via ORAL

## 2021-09-19 NOTE — Progress Notes (Signed)
Subjective:    Kara Fernandez is being seen today for her first obstetrical visit.  This is not a planned pregnancy. She is at [redacted]w[redacted]d gestation. Her obstetrical history is significant for  2 c/section. First baby was 'emergency c/section due to NRFHT and second C/section happened because she got to complete and they recommended a vacuum, which she refused, and opted for a c/section.   Reports that she went into shock afterwards.  Relationship with FOB: significant other, living together. Patient does intend to breast feed. Pregnancy history fully reviewed.  Recent diagnosis of Trich and chlamdyia; was not able to tolerate PO Flagyl so she got IV flagyl in MAU. She did not take her treatment for azithrymycin.   Patient has IIH-idiopathic intracranial hypertension. She was diagnosed for idiopathic intracranial hypertension after a routine eye exam and spent two days in the hospital. She has frequent headaches. She was on diamox but reports that she could not take it because it made her lose her appetite for everything. SHe stopped taking it and then was switched to topamax and lasix, her "fluid pill" . She reports that she doesn't like the way  the pill makes her feel. SHe cannot remember where she was seen for this (she was out of town).  Patient reports no complaints.   Review of Systems:   Review of Systems  Constitutional: Negative.   HENT: Negative.    Respiratory: Negative.    Cardiovascular: Negative.   Gastrointestinal: Negative.   Genitourinary: Negative.   Musculoskeletal: Negative.   Neurological: Negative.   Hematological: Negative.   Psychiatric/Behavioral: Negative.     Objective:     BP 125/73    Pulse 88    Wt 166 lb 11.2 oz (75.6 kg)    LMP 07/01/2021    BMI 31.50 kg/m  Physical Exam Cardiovascular:     Rate and Rhythm: Normal rate.  Skin:    General: Skin is warm.     Capillary Refill: Capillary refill takes less than 2 seconds.  Neurological:     General: No  focal deficit present.     Mental Status: She is alert and oriented to person, place, and time.  Psychiatric:        Mood and Affect: Mood normal.    Exam    Assessment:    Pregnancy: [id number] Patient Active Problem List   Diagnosis Date Noted   Heart murmur 09/19/2021   Hypertension 09/10/2021   Supervision of other normal pregnancy, antepartum 08/12/2021   Idiopathic intracranial hypertension 10/15/2017   Costochondritis 11/15/2015   Previous cesarean section 02/27/2015   Status post repeat low transverse cesarean section--due to [facility] 02/27/2015   Vaginal bleeding in pregnancy 07/10/2014   Fetal heart rate decelerations affecting management of mother 12/30/2011   History of chlamydia 12/29/2011   History of gonorrhea 12/29/2011   History of asthma - childhood 12/29/2011   Anemia 12/29/2011   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 Prenatal vitamins. Problem list reviewed and updated. AFP3 discussed:  will do next visit . Role of ultrasound in pregnancy discussed; fetal survey: ordered. Amniocentesis discussed: not indicated. Follow up in 4 weeks. 75% of 30 min visit spent on counseling and coordination of care.  -test of cure for trich collected -welcomed patient to practice; discussed with Dr. Donavan Foil and Dr. Shawnie Pons; who recommend urgent referral to neuro and encourage patient to restart diamox until she can be seen  by neurology. Long discussion of risks of untreated IIH, and patient agrees to do so.  -referral placed to neurology -received chlamydia treatment while at [facility]  -talk about VBAC vs. Repeat at future visits.  -started on baby ASA -All questions answered  Charlesetta Garibaldi [facility] 09/19/2021

## 2021-09-20 LAB — CERVICOVAGINAL ANCILLARY ONLY
Chlamydia: NEGATIVE
Comment: NEGATIVE
Comment: NEGATIVE
Comment: NORMAL
Neisseria Gonorrhea: NEGATIVE
Trichomonas: NEGATIVE

## 2021-09-20 LAB — CBC/D/PLT+RPR+RH+ABO+RUBIGG...
Antibody Screen: NEGATIVE
Basophils Absolute: 0 10*3/uL (ref 0.0–0.2)
Basos: 0 %
EOS (ABSOLUTE): 0.1 10*3/uL (ref 0.0–0.4)
Eos: 1 %
HCV Ab: <0.1 s/co ratio (ref 0.0–0.9)
HIV Screen 4th Generation wRfx: NONREACTIVE
Hematocrit: 35.5 % (ref 34.0–46.6)
Hemoglobin: 11.3 g/dL (ref 11.1–15.9)
Hepatitis B Surface Ag: NEGATIVE
Immature Grans (Abs): 0.1 10*3/uL (ref 0.0–0.1)
Immature Granulocytes: 1 %
Lymphocytes Absolute: 2.3 10*3/uL (ref 0.7–3.1)
Lymphs: 23 %
MCH: 22.1 pg — ABNORMAL LOW (ref 26.6–33.0)
MCHC: 31.8 g/dL (ref 31.5–35.7)
MCV: 69 fL — ABNORMAL LOW (ref 79–97)
Monocytes Absolute: 0.4 10*3/uL (ref 0.1–0.9)
Monocytes: 4 %
Neutrophils Absolute: 7.3 10*3/uL — ABNORMAL HIGH (ref 1.4–7.0)
Neutrophils: 71 %
Platelets: 431 10*3/uL (ref 150–450)
RBC: 5.12 x10E6/uL (ref 3.77–5.28)
RDW: 17.6 % — ABNORMAL HIGH (ref 11.7–15.4)
RPR Ser Ql: NONREACTIVE
Rh Factor: POSITIVE
Rubella Antibodies, IGG: 5.61 index (ref >0.99)
WBC: 10.3 10*3/uL (ref 3.4–10.8)

## 2021-09-20 LAB — HEMOGLOBIN A1C
Est. average glucose Bld gHb Est-mCnc: 120 mg/dL
Hgb A1c MFr Bld: 5.8 % — ABNORMAL HIGH (ref 4.8–5.6)

## 2021-09-20 LAB — HCV INTERPRETATION

## 2021-09-21 LAB — URINE CULTURE, OB REFLEX

## 2021-09-21 LAB — CULTURE, OB URINE

## 2021-09-26 ENCOUNTER — Ambulatory Visit: Payer: Medicaid Other | Admitting: Psychiatry

## 2021-10-04 ENCOUNTER — Telehealth: Payer: Self-pay

## 2021-10-04 NOTE — Telephone Encounter (Signed)
We are so excited you are beginning CenteringPregnancy Prenatal Care!  *As a reminder your appointment is 10/07/21 at 0900. You do not have to wait in the lobby. Just come on into the Centering room which is just past the Registrar desk to the left. You can ask the greeter to help you find it. *We plan to start on time at 0900 and finish on time at 1100. *Due to Covid restrictions you cannot bring anyone with you into the Centering room for now, we hope that changes soon.  We do encourage you to complete echeck in on your MyChart before your appointments. This will leave more time for our group sessions.  Any questions ? Please let us know.   Leonette Nutting  10/04/21

## 2021-10-05 ENCOUNTER — Encounter: Payer: Self-pay | Admitting: Advanced Practice Midwife

## 2021-10-05 DIAGNOSIS — O10919 Unspecified pre-existing hypertension complicating pregnancy, unspecified trimester: Secondary | ICD-10-CM | POA: Insufficient documentation

## 2021-10-06 NOTE — Progress Notes (Signed)
PRENATAL VISIT NOTE- Centering Pregnancy Cycle 2, Session # 1  Subjective:  Kara Fernandez is a 31 y.o. [id number] at [redacted]w[redacted]d being seen today for ongoing prenatal care through Centering Pregnancy.  She is currently monitored for the following issues for this high-risk pregnancy and has Herpes simplex type 2 infection; History of chlamydia; History of gonorrhea; History of asthma - childhood; Anemia; Fetal heart rate decelerations affecting management of mother; Vaginal bleeding in pregnancy; Previous cesarean section; Status post repeat low transverse cesarean section--due to [facility]; Costochondritis; Supervision of other normal pregnancy, antepartum; Heart murmur; and Idiopathic intracranial hypertension on their problem list.  Patient reports no complaints.   .  .   . Denies leaking of fluid/ROM.   The following portions of the patient's history were reviewed and updated as appropriate: allergies, current medications, past family history, past medical history, past social history, past surgical history and problem list. Problem list updated.  Objective:   Vitals:   10/07/21 0927  BP: 119/74  Pulse: 86  Weight: 163 lb 6.4 oz (74.1 kg)    Fetal Status: Fetal Heart Rate (bpm): 156         General:  Alert, oriented and cooperative. Patient is in no acute distress.  Skin: Skin is warm and dry. No rash noted.   Cardiovascular: Normal heart rate noted  Respiratory: Normal respiratory effort, no problems with respiration noted  Abdomen: Soft, gravid, appropriate for gestational age.        Pelvic: Cervical exam deferred        Extremities: Normal range of motion.     Mental Status: Normal mood and affect. Normal behavior. Normal judgment and thought content.   Assessment and Plan:  Pregnancy: [id number] at [redacted]w[redacted]d  1. Idiopathic intracranial hypertension --Referral to neurology made at NOB appt. Pt has not been seen yet. --Rx for Diamox--pt taking --Daily h/a but otherwise no  symptoms  2. Trichomoniasis --TOC neg 1/26  3. Chlamydia --TOC neg 1/26  4. Supervision of high risk pregnancy, antepartum  Centering Pregnancy, Session#1: Introduction to model of care. Group determined rules for self-governance and closing phrase. Oriented group to space and mother's notebook.   Facilitated discussion today:  common discomforts, When to call practice  Mindfulness activity completed as well as introduction to deep breathing for childbirth preparation- Centering 3 Breaths  Fundal height and FHR appropriate today unless noted otherwise in plan of care. Patient to continue group care.   --Desires smaller PNV--unsure of insurance coverage, will try Prenate mini  - Prenat-FeCbn-FeAsp-Meth-FA-DHA (PRENATE MINI) 18-0.6-0.4-350 MG CAPS; Take 1 capsule by mouth daily.  Dispense: 30 capsule; Refill: 11  --Pregnancy 12-16 weeks: NIPS: low risk Horizon:--carrier beta thal AFP:  Needs--RN to schedule with [place] Ultrasound: scheduled 11/12/21  5. Herpes simplex type 2 infection --Needs prophylaxis at 36 weeks  6. Previous cesarean section  x2  7. Elevated hemoglobin A1c --Elevated, early 2 hour scheduled  8. Headache in pregnancy, antepartum, second trimester --Solutions/remedies reviewed, pt stopped caffeine cold [place] in early pregnancy so discussed safe amounts of caffeine to try for h/a today  9. 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 Please refer to After Visit Summary for other counseling recommendations.  No follow-ups on file.  Future Appointments  Date Time Provider [place]  11/04/2021  9:00 AM CENTERING PROVIDER [facility]  11/12/2021  8:00 AM [facility] NURSE [facility]  11/12/2021  8:15 AM [facility] US2 [facility] [place]  11/12/2021   9:30 AM [facility] LAB [facility]  12/02/2021  9:00 AM CENTERING PROVIDER [facility]  12/30/2021  9:00 AM CENTERING PROVIDER [facility]  01/13/2022  9:00 AM CENTERING PROVIDER [facility]  01/27/2022  9:00 AM CENTERING PROVIDER [facility]  02/10/2022  9:00 AM CENTERING PROVIDER [facility]  02/24/2022  9:00 AM CENTERING PROVIDER [facility]  03/10/2022  9:00 AM CENTERING PROVIDER [facility]  03/24/2022  9:00 AM CENTERING PROVIDER [facility] [place]    Fatima Blank, CNM

## 2021-10-07 ENCOUNTER — Ambulatory Visit (INDEPENDENT_AMBULATORY_CARE_PROVIDER_SITE_OTHER): Payer: Self-pay | Admitting: Advanced Practice Midwife

## 2021-10-07 ENCOUNTER — Other Ambulatory Visit: Payer: Self-pay

## 2021-10-07 ENCOUNTER — Telehealth: Payer: Self-pay | Admitting: *Deleted

## 2021-10-07 ENCOUNTER — Encounter: Payer: Self-pay | Admitting: *Deleted

## 2021-10-07 ENCOUNTER — Encounter: Payer: Self-pay | Admitting: Advanced Practice Midwife

## 2021-10-07 VITALS — BP 119/74 | HR 86 | Wt 163.4 lb

## 2021-10-07 DIAGNOSIS — A599 Trichomoniasis, unspecified: Secondary | ICD-10-CM

## 2021-10-07 DIAGNOSIS — A749 Chlamydial infection, unspecified: Secondary | ICD-10-CM

## 2021-10-07 DIAGNOSIS — B009 Herpesviral infection, unspecified: Secondary | ICD-10-CM

## 2021-10-07 DIAGNOSIS — O0990 Supervision of high risk pregnancy, unspecified, unspecified trimester: Secondary | ICD-10-CM

## 2021-10-07 DIAGNOSIS — G932 Benign intracranial hypertension: Secondary | ICD-10-CM

## 2021-10-07 DIAGNOSIS — R7309 Other abnormal glucose: Secondary | ICD-10-CM

## 2021-10-07 DIAGNOSIS — O26892 Other specified pregnancy related conditions, second trimester: Secondary | ICD-10-CM

## 2021-10-07 DIAGNOSIS — R519 Headache, unspecified: Secondary | ICD-10-CM

## 2021-10-07 DIAGNOSIS — Z98891 History of uterine scar from previous surgery: Secondary | ICD-10-CM

## 2021-10-07 DIAGNOSIS — D563 Thalassemia minor: Secondary | ICD-10-CM

## 2021-10-07 MED ORDER — PRENATE MINI 18-0.6-0.4-350 MG PO CAPS: 1.0000 | ORAL_CAPSULE | Freq: Every day | ORAL | 11 refills | Status: DC

## 2021-10-07 NOTE — Telephone Encounter (Signed)
Received Horizon results indicating pt carrier for Beta-Hemoglobinopathy or Beta-Thalassemia. I called patient to notify and heard a message voicemail is full. I will send a MyChart message. Jazyah Butsch,RN

## 2021-10-08 ENCOUNTER — Telehealth: Payer: Self-pay

## 2021-10-08 NOTE — Telephone Encounter (Signed)
Per Sharen Counter, CNM pt needs an early 2hr glucose due slightly elevated A1C.  Notified pt of providers recommendation.  Pt stated that she will be able to come in on 10/10/21 @ 0815.  Pt advised to come in fasting starting at midnight the night before and that she will be in the office for at least two hours.  Pt verbalized understanding.    Leonette Nutting  10/08/21

## 2021-10-10 ENCOUNTER — Other Ambulatory Visit: Payer: Self-pay

## 2021-10-10 ENCOUNTER — Other Ambulatory Visit: Payer: Medicaid Other

## 2021-10-10 DIAGNOSIS — R7309 Other abnormal glucose: Secondary | ICD-10-CM

## 2021-10-10 NOTE — Addendum Note (Signed)
Addended by: Marjo Bicker on: 10/10/2021 08:31 AM   Modules accepted: Orders

## 2021-10-11 LAB — GLUCOSE TOLERANCE, 2 HOURS W/ 1HR
Glucose, 1 hour: 132 mg/dL (ref 70–179)
Glucose, 2 hour: 117 mg/dL (ref 70–152)
Glucose, Fasting: 74 mg/dL (ref 70–91)

## 2021-10-31 ENCOUNTER — Ambulatory Visit (INDEPENDENT_AMBULATORY_CARE_PROVIDER_SITE_OTHER): Payer: Self-pay

## 2021-10-31 ENCOUNTER — Other Ambulatory Visit: Payer: Self-pay

## 2021-10-31 VITALS — BP 117/64 | HR 86 | Wt 164.7 lb

## 2021-10-31 DIAGNOSIS — Z3480 Encounter for supervision of other normal pregnancy, unspecified trimester: Secondary | ICD-10-CM

## 2021-10-31 DIAGNOSIS — Z202 Contact with and (suspected) exposure to infections with a predominantly sexual mode of transmission: Secondary | ICD-10-CM

## 2021-10-31 MED ORDER — AZITHROMYCIN 500 MG PO TABS
1000.0000 mg | ORAL_TABLET | Freq: Once | ORAL | Status: AC
  Administered 2021-10-31: 11:00:00 1000 mg via ORAL

## 2021-10-31 MED ORDER — TINIDAZOLE 500 MG PO TABS: 2.0000 g | ORAL_TABLET | Freq: Once | ORAL | 0 refills | Status: DC

## 2021-10-31 NOTE — Progress Notes (Signed)
Patient is here today for treatment of Chlamydia and Trichomonas. Per chart review, patient tested positive for Trichomonas and Chlamydia on 10/14/21 and was provided treatment. Negative TOC on 09/19/21. Patient states her partner retested positive on 10/24/21. States they both misunderstood instructions and had intercourse 3 days following last treatment. Patient treated for Chlamydia and Trichomonas per protocol. Given Zithromax 1 g PO in office. Tindamax 2 g PO sent to patient's preferred pharmacy. Pt prefers to do one time dose vs week of Flagyl. States she had nausea and vomiting following single dose of Flagyl in MAU. Reviewed importance of waiting at least 7 days from treatment to resume intercourse. Encouraged pt to wait 2 weeks to be cautious. Pt states her partner has been given medication for treatment and plans to take today.  ? ?Patient to follow up at routine OB appt on 11/04/21. Will need to be scheduled for TOC in 3 weeks. ? Fleet Contras RN ?10/31/21 ?

## 2021-11-03 ENCOUNTER — Other Ambulatory Visit: Payer: Self-pay

## 2021-11-04 ENCOUNTER — Encounter: Payer: Medicaid Other | Admitting: Advanced Practice Midwife

## 2021-11-06 ENCOUNTER — Encounter (HOSPITAL_COMMUNITY): Payer: Self-pay | Admitting: Obstetrics and Gynecology

## 2021-11-06 ENCOUNTER — Inpatient Hospital Stay (HOSPITAL_COMMUNITY)
Admission: AD | Admit: 2021-11-06 | Discharge: 2021-11-06 | Disposition: A | Discharge status: Home / Self Care | Payer: Medicaid Other | Attending: Emergency Medicine | Admitting: Emergency Medicine

## 2021-11-06 ENCOUNTER — Other Ambulatory Visit: Payer: Self-pay

## 2021-11-06 DIAGNOSIS — R110 Nausea: Secondary | ICD-10-CM | POA: Insufficient documentation

## 2021-11-06 DIAGNOSIS — O99352 Diseases of the nervous system complicating pregnancy, second trimester: Secondary | ICD-10-CM | POA: Diagnosis present

## 2021-11-06 DIAGNOSIS — O26899 Other specified pregnancy related conditions, unspecified trimester: Secondary | ICD-10-CM

## 2021-11-06 DIAGNOSIS — Z3A18 18 weeks gestation of pregnancy: Secondary | ICD-10-CM | POA: Diagnosis not present

## 2021-11-06 DIAGNOSIS — H539 Unspecified visual disturbance: Secondary | ICD-10-CM | POA: Diagnosis not present

## 2021-11-06 DIAGNOSIS — O99891 Other specified diseases and conditions complicating pregnancy: Secondary | ICD-10-CM | POA: Diagnosis not present

## 2021-11-06 DIAGNOSIS — H5713 Ocular pain, bilateral: Secondary | ICD-10-CM | POA: Diagnosis not present

## 2021-11-06 DIAGNOSIS — R519 Headache, unspecified: Secondary | ICD-10-CM

## 2021-11-06 DIAGNOSIS — G932 Benign intracranial hypertension: Secondary | ICD-10-CM | POA: Insufficient documentation

## 2021-11-06 DIAGNOSIS — Z79899 Other long term (current) drug therapy: Secondary | ICD-10-CM | POA: Insufficient documentation

## 2021-11-06 DIAGNOSIS — O10012 Pre-existing essential hypertension complicating pregnancy, second trimester: Secondary | ICD-10-CM | POA: Insufficient documentation

## 2021-11-06 LAB — URINALYSIS, ROUTINE W REFLEX MICROSCOPIC
Bilirubin Urine: NEGATIVE
Glucose, UA: NEGATIVE mg/dL
Hgb urine dipstick: NEGATIVE
Ketones, ur: NEGATIVE mg/dL
Leukocytes,Ua: NEGATIVE
Nitrite: NEGATIVE
Protein, ur: NEGATIVE mg/dL
Specific Gravity, Urine: 1.02 (ref 1.005–1.030)
pH: 5 (ref 5.0–8.0)

## 2021-11-06 MED ORDER — ACETAMINOPHEN 500 MG PO TABS
1000.0000 mg | ORAL_TABLET | Freq: Once | ORAL | Status: AC
  Administered 2021-11-06: 1000 mg via ORAL
  Filled 2021-11-06: qty 2

## 2021-11-06 MED ORDER — LACTATED RINGERS IV BOLUS
1000.0000 mL | Freq: Once | INTRAVENOUS | Status: AC
  Administered 2021-11-06: 1000 mL via INTRAVENOUS

## 2021-11-06 MED ORDER — METOCLOPRAMIDE HCL 5 MG/ML IJ SOLN
10.0000 mg | Freq: Once | INTRAMUSCULAR | Status: AC
  Administered 2021-11-06: 10 mg via INTRAVENOUS
  Filled 2021-11-06: qty 2

## 2021-11-06 NOTE — MAU Note (Signed)
Kara Fernandez is a 31 y.o. at [redacted]w[redacted]d here in MAU reporting: H/A, bilateral eye pain, dehydration, and left shoulder pain.  Reports currently has a H/A that was partially relieved with Tylenol.  States has H/A's daily but now has bilateral eye pain, states lost vision in right eye last night but vision has since returned.  Also c/o left shoulder pain, reports unable to lift arm.  States she's dehydrated, hasn't been vomiting but hasn't eaten since yesterday afternoon. ? ?Onset of complaint: ongoing ?Pain score: 10 bilat eyes ?Vitals:  ? 11/06/21 1606  ?BP: 115/72  ?Pulse: (!) 126  ?Resp: 18  ?Temp: 98.2 ?F (36.8 ?C)  ?SpO2: 100%  ?   ?FHT:172 bpm ?Lab orders placed from triage:    ?

## 2021-11-06 NOTE — MAU Note (Signed)
Report given to Sandy Salaam in ED ?

## 2021-11-06 NOTE — MAU Provider Note (Signed)
?History  ?  ? ?CSN: [id number] ? ?Arrival date and time: 11/06/21 1531 ? ? Event Date/Time  ? First Provider Initiated Contact with Patient 11/06/21 1627   ?  ? ?Chief Complaint  ?Patient presents with  ? Headache  ? Eye Pain  ? Shoulder Pain  ? ?HPI ? ?[name] is a 31 y.o. Female [id number] @ [redacted]w[redacted]d here with headache. She has a history of idiopathic intracranial hypertension. She reports her headache pain 10/10. She has tried laying in the dark, tylenol 500 mg oral. The headache is constant. Yesterday she lost vision in just her right eye and then her vision returned to normal. The pain is worse in the front of her head. She also reports pain in her eyes. She has not eaten anything today or yesterday because of her headache. No nausea or vomiting. + bilateral eye pain.  ?She is taking lasix daily, however has missed a few pills.  ?2 years ago she went for a routine eye exam and was urgently sent to [facility] and was diagnosed with idiopathic intracranial hypertension. She has not had any head imaging since that visit.  ? ?OB History   ? ? Gravida  ?4  ? Para  ?2  ? Term  ?2  ? Preterm  ?   ? AB  ?1  ? Living  ?2  ?  ? ? SAB  ?   ? IAB  ?1  ? Ectopic  ?   ? Multiple  ?0  ? Live Births  ?2  ?   ?  ?  ? ? ?Past Medical History:  ?Diagnosis Date  ? Abnormal Pap smear   ? completed  at age 50  ? Angina ~2000  ? enlarged heart, pain due to stress  ? ASCUS on Pap smear   ? C&B 10/09 W/LGSIL  ? Asthma   ? as a child   ? Chlamydia infection   ? POSITIVE 10/06,8/07,7/09,11/11 ;at age 40  ? GC (gonococcus infection)   ? Gonorrhea   ? POSITIVE 8/06, 10/06; at age 30  ? H/O bacterial infection   ? frequently  ? H/O multiple allergies   ? H/O varicella   ? History of high blood pressure   ? 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 POSITIVE HR  HPV 10/06  ? 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 patient states that she has never had outbreak- dx: 2011  ? Hypertension   ? not while rpegnant  ? Ovarian cyst   ?  Ovarian cyst   ? Trichomonal vulvovaginitis   ? UTI (lower urinary tract infection)   ? Vaginal Pap smear, abnormal   ? Yeast infection   ? ? ?Past Surgical History:  ?Procedure Laterality Date  ? CESAREAN SECTION  12/29/2011  ? Procedure: CESAREAN SECTION;  Surgeon: Purcell Nails, MD;  Location: [facility];  Service: Gynecology;  Laterality: N/A;  ? CESAREAN SECTION N/A 02/26/2015  ? Procedure: CESAREAN SECTION;  Surgeon: Osborn Coho, MD;  Location: [facility];  Service: Obstetrics;  Laterality: N/A;  ? DILATION AND CURETTAGE OF UTERUS    ? abortion  ? ? ?Family History  ?Problem Relation Age of Onset  ? Hypertension Mother   ? Hypertension Father   ? Hypertension Maternal Grandmother   ? Stroke Maternal Grandmother   ? Hypertension Maternal Grandfather   ? Hypertension Maternal Aunt   ? Hypertension Maternal Uncle   ? Alcohol abuse Maternal  Uncle   ? Alcohol abuse Paternal Uncle   ? ? ?Social History  ? ?Tobacco Use  ? Smoking status: Former  ? Smokeless tobacco: Never  ?Vaping Use  ? Vaping Use: Former  ?Substance Use Topics  ? Alcohol use: Not Currently  ? Drug use: Yes  ?  Types: Marijuana  ?  Comment: 06/2021  ? ? ?Allergies: No Known Allergies ? ?Medications Prior to Admission  ?Medication Sig Dispense Refill Last Dose  ? acetaminophen (TYLENOL) 325 MG tablet Take 650 mg by mouth every 6 (six) hours as needed. (Patient not taking: Reported on 10/31/2021)     ? acetaZOLAMIDE (DIAMOX) 250 MG tablet Take 1 tablet (250 mg total) by mouth 2 (two) times daily. (Patient not taking: Reported on 10/31/2021) 60 tablet 1   ? aspirin 81 MG chewable tablet Chew 1 tablet (81 mg total) by mouth daily. 30 tablet 7   ? Blood Pressure Monitoring DEVI 1 each by Does not apply route once a week. 1 each 0   ? famotidine (PEPCID) 20 MG tablet Take 1 tablet (20 mg total) by mouth 2 (two) times daily. (Patient not taking: Reported on 10/31/2021) 30 tablet 4   ? ondansetron (ZOFRAN-ODT) 8 MG disintegrating tablet Take 1 tablet (8 mg total) by  mouth every 8 (eight) hours as needed for nausea or vomiting. 30 tablet 2   ? Prenat-FeCbn-FeAsp-Meth-FA-DHA (PRENATE MINI) 18-0.6-0.4-350 MG CAPS Take 1 capsule by mouth daily. (Patient not taking: Reported on 10/31/2021) 30 capsule 11   ? prochlorperazine (COMPAZINE) 10 MG tablet Take 1 tablet (10 mg total) by mouth 2 (two) times daily as needed for nausea or vomiting. (Patient not taking: Reported on 10/31/2021) 30 tablet 2   ? promethazine (PHENERGAN) 25 MG tablet Take 1 tablet (25 mg total) by mouth every 6 (six) hours as needed for nausea or vomiting. (Patient not taking: Reported on 10/31/2021) 30 tablet 2   ? valACYclovir (VALTREX) 500 MG tablet Take 1 tablet (500 mg total) by mouth 2 (two) times daily as needed (for breakouts). (Patient not taking: Reported on 10/31/2021) 6 tablet 11   ? ?Results for orders placed or performed during the hospital encounter of 11/06/21 (from the past 48 hour(s))  ?Urinalysis, Routine w reflex microscopic Urine, Clean Catch     Status: Abnormal  ? Collection Time: 11/06/21  3:49 PM  ?Result Value Ref Range  ? Color, Urine YELLOW YELLOW  ? APPearance HAZY (A) CLEAR  ? Specific Gravity, Urine 1.020 1.005 - 1.030  ? pH 5.0 5.0 - 8.0  ? Glucose, UA NEGATIVE NEGATIVE mg/dL  ? Hgb urine dipstick NEGATIVE NEGATIVE  ? Bilirubin Urine NEGATIVE NEGATIVE  ? Ketones, ur NEGATIVE NEGATIVE mg/dL  ? Protein, ur NEGATIVE NEGATIVE mg/dL  ? Nitrite NEGATIVE NEGATIVE  ? Leukocytes,Ua NEGATIVE NEGATIVE  ?  Comment: Performed at [facility], [address] [place], [place] [address]  ?  ?Review of Systems  ?Constitutional:  Negative for fever.  ?Eyes:  Positive for visual disturbance.  ?Gastrointestinal:  Positive for nausea. Negative for vomiting.  ?Neurological:  Positive for headaches.  ?Physical Exam  ? ?Blood pressure 115/72, pulse (!) 126, temperature 98.2 ?F (36.8 ?C), temperature source Oral, resp. rate 18, height 5\' 1"  (1.549 m), weight 72.8 kg, last menstrual period 07/01/2021, SpO2 100  %, unknown if currently breastfeeding. ? ?Physical Exam ?Constitutional:   ?   General: She is not in acute distress. ?   Appearance: She is well-developed. She is  not ill-appearing, toxic-appearing or diaphoretic.  ?HENT:  ?   Head: Normocephalic.  ?Eyes:  ?   General: No visual field deficit. ?Neurological:  ?   Mental Status: She is alert and oriented to person, place, and time.  ?   GCS: GCS eye subscore is 4. GCS verbal subscore is 5. GCS motor subscore is 6.  ?   Cranial Nerves: No facial asymmetry.  ?Psychiatric:     ?   Mood and Affect: Mood normal.  ? ?MAU Course  ?Procedures ? ?MDM ? ?Discussed patient with Dr. Para March and [facility] ED provider Loistine Simas. Patient needs a further neuro workup and it would be best done over in the main ED. + fetal heart tones and BP is normal. The patient is stable for transfer.  ? ?Assessment and Plan  ? ?A: ? ? ?1. [redacted] weeks gestation of pregnancy   ?2. Pregnancy headache, antepartum   ?3. Bad headache   ?  ? ?P: ? ?Transfer to [facility] ED.  ? ?Duane Lope, NP ?11/07/2021 ?11:25 AM ? ?

## 2021-11-06 NOTE — ED Provider Notes (Signed)
?[facility] EMERGENCY DEPARTMENT ?Provider Note ? ? ?CSN: [id number] ?Arrival date & time: 11/06/21  1531 ? ?  ? ?History ? ?Chief Complaint  ?Patient presents with  ? Headache  ? Eye Pain  ? Shoulder Pain  ? ? ? ? 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 LP with an opening pressure of 41. Patient was started on Diamox 500 mg BID and discharged home with plans for an outpatient MRI to r/o mass lesion of VST as well as follow-up with Neurology. Outpatient MRI [facility] was negative.  ? ?Kara Fernandez is a 31 y.o. female.  Presenting to ER with concern for headache.  Patient states that she has a history of headaches.  Per review of chart she has a history of idiopathic intracranial hypertension.  Patient states that she did not like the way the Diamox made her feel and had been switched to Lasix over a year ago by her primary care doctor.  States that since she has been pregnant she has been dealing with increased headaches, near daily.  Sometimes notices slight changes in her vision but currently does not have any visual changes.  She went to the MAU for evaluation and was advised to come to ER for further treatment.  Headache is currently moderate to severe.  Did not improve with Tylenol taken earlier this morning. ?HPI ? ?  ? ?Home Medications ?Prior to Admission medications   ?Medication Sig Start Date End Date Taking? Authorizing Provider  ?acetaminophen (TYLENOL) 325 MG tablet Take 650 mg by mouth every 6 (six) hours as needed. ?Patient not taking: Reported on 10/31/2021    [provider]  ?acetaZOLAMIDE (DIAMOX) 250 MG tablet Take 1 tablet (250 mg total) by mouth 2 (two) times daily. ?Patient not taking: Reported on 10/31/2021 09/19/21   Marylene LandKooistra, Kathryn Lorraine, CNM  ?aspirin 81 MG chewable tablet Chew 1 tablet (81 mg total) by mouth daily. 09/19/21   Marylene LandKooistra, Kathryn Lorraine, CNM  ?Blood Pressure Monitoring  DEVI 1 each by Does not apply route once a week. 09/10/21   Marylene LandKooistra, Kathryn Lorraine, CNM  ?famotidine (PEPCID) 20 MG tablet Take 1 tablet (20 mg total) by mouth 2 (two) times daily. ?Patient not taking: Reported on 10/31/2021 08/13/21   Rolm BookbinderNeill, Caroline M, CNM  ?ondansetron (ZOFRAN-ODT) 8 MG disintegrating tablet Take 1 tablet (8 mg total) by mouth every 8 (eight) hours as needed for nausea or vomiting. 08/13/21   Rolm BookbinderNeill, Caroline M, CNM  ?Prenat-FeCbn-FeAsp-Meth-FA-DHA (PRENATE MINI) 18-0.6-0.4-350 MG CAPS Take 1 capsule by mouth daily. ?Patient not taking: Reported on 10/31/2021 10/07/21   Hurshel PartyLeftwich-Kirby, Lisa A, CNM  ?prochlorperazine (COMPAZINE) 10 MG tablet Take 1 tablet (10 mg total) by mouth 2 (two) times daily as needed for nausea or vomiting. ?Patient not taking: Reported on 10/31/2021 08/13/21   Rolm BookbinderNeill, Caroline M, CNM  ?promethazine (PHENERGAN) 25 MG tablet Take 1 tablet (25 mg total) by mouth every 6 (six) hours as needed for nausea or vomiting. ?Patient not taking: Reported on 10/31/2021 08/13/21   Rolm BookbinderNeill, Caroline M, CNM  ?valACYclovir (VALTREX) 500 MG tablet Take 1 tablet (500 mg total) by mouth 2 (two) times daily as needed (for breakouts). ?Patient not taking: Reported on 10/31/2021 12/04/15   Hurshel PartyLeftwich-Kirby, Lisa A, CNM  ?potassium chloride (K-DUR) 10 MEQ tablet Take 1 tablet (10 mEq total) by mouth daily. 10/22/18 12/26/19  Jacalyn LefevreHaviland, Julie, MD  ?   ? ?Allergies    ?  Patient has no known allergies.   ? ?Review of Systems   ?Review of Systems  ?Neurological:  Positive for headaches.  ?All other systems reviewed and are negative. ? ?Physical Exam ?Updated Vital Signs ?BP 118/72 (BP Location: Right Arm)   Pulse (!) 107   Temp 98 ?F (36.7 ?C) (Oral)   Resp 16   Ht 5\' 1"  (1.549 m)   Wt 72.8 kg   LMP 07/01/2021   SpO2 100%   BMI 30.31 kg/m?  ?Physical Exam ?Vitals and nursing note reviewed.  ?Constitutional:   ?   General: She is not in acute distress. ?   Appearance: She is well-developed.  ?HENT:  ?   Head:  Normocephalic and atraumatic.  ?Eyes:  ?   Conjunctiva/sclera: Conjunctivae normal.  ?Cardiovascular:  ?   Rate and Rhythm: Normal rate and regular rhythm.  ?   Heart sounds: No murmur heard. ?Pulmonary:  ?   Effort: Pulmonary effort is normal. No respiratory distress.  ?   Breath sounds: Normal breath sounds.  ?Abdominal:  ?   Palpations: Abdomen is soft.  ?   Tenderness: There is no abdominal tenderness.  ?Musculoskeletal:     ?   General: No swelling.  ?   Cervical back: Neck supple.  ?Skin: ?   General: Skin is warm and dry.  ?   Capillary Refill: Capillary refill takes less than 2 seconds.  ?Neurological:  ?   Mental Status: She is alert and oriented to person, place, and time.  ?Psychiatric:     ?   Mood and Affect: Mood normal.  ? ? ?ED Results / Procedures / Treatments   ?Labs ?(all labs ordered are listed, but only abnormal results are displayed) ?Labs Reviewed  ?URINALYSIS, ROUTINE W REFLEX MICROSCOPIC - Abnormal; Notable for the following components:  ?    Result Value  ? APPearance HAZY (*)   ? All other components within normal limits  ? ? ?EKG ?None ? ?Radiology ?No results found. ? ?Procedures ?Procedures  ? ? ?Medications Ordered in ED ?Medications  ?lactated ringers bolus 1,000 mL (0 mLs Intravenous Stopped 11/06/21 2105)  ?metoCLOPramide (REGLAN) injection 10 mg (10 mg Intravenous Given 11/06/21 1929)  ?acetaminophen (TYLENOL) tablet 1,000 mg (1,000 mg Oral Given 11/06/21 1929)  ? ? ?ED Course/ Medical Decision Making/ A&P ?  ?                        ?Medical Decision Making ?Risk ?OTC drugs. ?Prescription drug management. ? ? ?31 year old lady with history of idiopathic intracranial hypertension currently [redacted] weeks pregnant presenting to ER with concern for headache.  Had been seen by MAU and cleared from OB standpoint.  Her blood pressure is normal.  She appears well in no distress.  She was provided some fluids and I discussed treatment options with pharmacy for headache cocktail pregnancy and they  advised Reglan.  Patient's symptoms have completely resolved on reassessment.  Given adequate symptom control, feel she is stable for discharge and managed in the outpatient setting.  Advised following up with her primary care doctor, patient reports that her PCP had referred her to a local neurologist, stressed importance of following up with these providers.  Reviewed return precautions and discharged. ? ? ? ?After the discussed management above, the patient was determined to be safe for discharge.  The patient was in agreement with this plan and all questions regarding their care were answered.  ED return precautions were  discussed and the patient will return to the ED with any significant worsening of condition. ? ? ? ? ? ? ? ? ?Final Clinical Impression(s) / ED Diagnoses ?Final diagnoses:  ?[redacted] weeks gestation of pregnancy  ?Pregnancy headache, antepartum  ?Bad headache  ? ? ?Rx / DC Orders ?ED Discharge Orders   ? ? None  ? ?  ? ? ?  ?Milagros Loll, MD ?11/06/21 2209 ? ?

## 2021-11-06 NOTE — Discharge Instructions (Signed)
Please follow-up with your primary care doctor as well as the neurologist that she has referred you to.  Contact both of their clinics to request close follow-up appointment to discuss management of your headaches going forward during this pregnancy.  If you develop uncontrolled headache, vomiting, fever, neck stiffness or other new concerning symptom, please return to ER for reassessment. ?

## 2021-11-12 ENCOUNTER — Other Ambulatory Visit: Payer: Self-pay

## 2021-11-12 ENCOUNTER — Other Ambulatory Visit: Payer: Medicaid Other

## 2021-11-12 ENCOUNTER — Encounter: Payer: Self-pay | Admitting: *Deleted

## 2021-11-12 ENCOUNTER — Ambulatory Visit: Payer: Medicaid Other | Attending: Student

## 2021-11-12 ENCOUNTER — Other Ambulatory Visit: Payer: Self-pay | Admitting: *Deleted

## 2021-11-12 ENCOUNTER — Ambulatory Visit: Payer: Medicaid Other | Admitting: *Deleted

## 2021-11-12 VITALS — BP 125/63 | HR 84

## 2021-11-12 DIAGNOSIS — Z363 Encounter for antenatal screening for malformations: Secondary | ICD-10-CM | POA: Diagnosis present

## 2021-11-12 DIAGNOSIS — O285 Abnormal chromosomal and genetic finding on antenatal screening of mother: Secondary | ICD-10-CM

## 2021-11-12 DIAGNOSIS — Z148 Genetic carrier of other disease: Secondary | ICD-10-CM | POA: Diagnosis not present

## 2021-11-12 DIAGNOSIS — Z3480 Encounter for supervision of other normal pregnancy, unspecified trimester: Secondary | ICD-10-CM | POA: Insufficient documentation

## 2021-11-12 DIAGNOSIS — Z3A19 19 weeks gestation of pregnancy: Secondary | ICD-10-CM | POA: Diagnosis not present

## 2021-11-12 DIAGNOSIS — I10 Essential (primary) hypertension: Secondary | ICD-10-CM | POA: Insufficient documentation

## 2021-11-12 DIAGNOSIS — O34219 Maternal care for unspecified type scar from previous cesarean delivery: Secondary | ICD-10-CM | POA: Insufficient documentation

## 2021-11-12 DIAGNOSIS — Z362 Encounter for other antenatal screening follow-up: Secondary | ICD-10-CM

## 2021-11-12 DIAGNOSIS — M940 Chondrocostal junction syndrome [Tietze]: Secondary | ICD-10-CM | POA: Insufficient documentation

## 2021-11-12 DIAGNOSIS — O0990 Supervision of high risk pregnancy, unspecified, unspecified trimester: Secondary | ICD-10-CM | POA: Insufficient documentation

## 2021-11-12 DIAGNOSIS — D563 Thalassemia minor: Secondary | ICD-10-CM

## 2021-11-12 DIAGNOSIS — B009 Herpesviral infection, unspecified: Secondary | ICD-10-CM | POA: Insufficient documentation

## 2021-11-18 ENCOUNTER — Other Ambulatory Visit: Payer: Self-pay | Admitting: *Deleted

## 2021-11-18 DIAGNOSIS — O219 Vomiting of pregnancy, unspecified: Secondary | ICD-10-CM

## 2021-11-18 MED ORDER — ONDANSETRON 8 MG PO TBDP: 8.0000 mg | ORAL_TABLET | Freq: Three times a day (TID) | ORAL | 2 refills | Status: DC | PRN

## 2021-11-19 ENCOUNTER — Telehealth: Payer: Self-pay

## 2021-11-19 NOTE — Telephone Encounter (Signed)
Pt requests a refill on her Zofran.  Per chart review pt received refill on her Zofran on 11/18/21.  Pharmacy to notify patient. Unable to leave message.   ? ?Alcario Tinkey,RN  ?11/19/21 ?

## 2021-11-22 ENCOUNTER — Other Ambulatory Visit: Payer: Self-pay

## 2021-11-22 NOTE — Telephone Encounter (Signed)
Received notification fax from [facility] requesting refill on Zofran.  Per chart review pt received refill on medication on 11/18/21.  ? ?Shaynah Hund,RN  ?11/22/21 ?

## 2021-11-26 NOTE — Progress Notes (Deleted)
? ? ? ? ? ?  PRENATAL VISIT NOTE- Centering Pregnancy Cycle {NUMBERS:20191}, Session # {NUMBERS:20191} ? ?Subjective:  ?Kara Fernandez is a 31 y.o. [id number] at [redacted]w[redacted]d being seen today for ongoing prenatal care through [facility] Pregnancy.  She is currently monitored for the following issues for this {Blank single:19197::"high-risk","low-risk"} pregnancy and has Herpes simplex type 2 infection; History of chlamydia; History of gonorrhea; History of asthma - childhood; Status post repeat low transverse cesarean section--due to [facility]; Supervision of other normal pregnancy, antepartum; Heart murmur; and Idiopathic intracranial hypertension on their problem list. ? ?Patient reports {sx:14538}.   .  .   . ***Denies leaking of fluid/ROM.  ? ?The following portions of the patient's history were reviewed and updated as appropriate: allergies, current medications, past family history, past medical history, past social history, past surgical history and problem list. Problem list updated. ? ?Objective:  ?There were no vitals filed for this visit. ? ?Fetal Status:          ? ?General:  Alert, oriented and cooperative. Patient is in no acute distress.  ?Skin: Skin is warm and dry. No rash noted.   ?Cardiovascular: Normal heart rate noted  ?Respiratory: Normal respiratory effort, no problems with respiration noted  ?Abdomen: Soft, gravid, appropriate for gestational age.        ?Pelvic: {Blank single:19197::"Cervical exam performed","Cervical exam deferred"}        ?Extremities: Normal range of motion.     ?Mental Status: Normal mood and affect. Normal behavior. Normal judgment and thought content.  ? ?Assessment and Plan:  ?Pregnancy: [id number] at [redacted]w[redacted]d ? ?1. Supervision of other normal pregnancy, antepartum ? ?Centering Pregnancy, Session#3: Reviewed resources in [employer].  ? ?Facilitated discussion today:  Stress/Stress reduction and breastfeeding/infant nutrition ?Mindfulness activity completed as well as deep  breathing with still touch for childbirth preparation.   ? ?Fundal height and FHR appropriate today unless noted otherwise in plan. Patient to continue group care.  ? ? ?2. History of 2 cesarean sections ?*** ? ?3. Exposure to chlamydia ?*** ? ?4. Idiopathic intracranial hypertension ?*** ? ?5. [redacted] weeks gestation of pregnancy ?*** ? ? ? ? ? ?{Blank single:19197::"Term","Preterm"} labor symptoms and general obstetric precautions including but not limited to vaginal bleeding, contractions, leaking of fluid and fetal movement were reviewed in detail with the patient. ?Please refer to After Visit Summary for other counseling recommendations.  ?No follow-ups on file. ? ?Future Appointments  ?Date Time Provider [place]  ?12/02/2021  9:00 AM CENTERING PROVIDER [facility] [place]  ?12/11/2021  9:30 AM [facility] NURSE [facility]  ?12/11/2021  9:45 AM [facility] US4 [facility]  ?12/30/2021  9:00 AM CENTERING PROVIDER [facility] [place]  ?01/13/2022  9:00 AM CENTERING PROVIDER [facility]  ?01/27/2022  9:00 AM CENTERING PROVIDER [facility] [place]  ?02/10/2022  9:00 AM CENTERING PROVIDER [facility] [place]  ?02/24/2022  9:00 AM CENTERING PROVIDER [facility] [place]  ?03/10/2022  9:00 AM CENTERING PROVIDER [facility] [place]  ?03/24/2022  9:00 AM CENTERING PROVIDER [facility] [place]  ? ? ?Fatima Blank, CNM ? ?

## 2021-11-28 ENCOUNTER — Telehealth: Payer: Self-pay | Admitting: Family Medicine

## 2021-11-28 NOTE — Telephone Encounter (Signed)
Called pt to discuss her request for Diflucan. Heard message stating that the call can not be completed at this time. Voicemail was not available.  ?

## 2021-11-28 NOTE — Telephone Encounter (Signed)
Patient called to cancel her appointment due to she is out of town, she is also requesting a Refill on Diflucan  ?

## 2021-12-02 ENCOUNTER — Telehealth: Payer: Self-pay

## 2021-12-02 DIAGNOSIS — Z202 Contact with and (suspected) exposure to infections with a predominantly sexual mode of transmission: Secondary | ICD-10-CM

## 2021-12-02 DIAGNOSIS — G932 Benign intracranial hypertension: Secondary | ICD-10-CM

## 2021-12-02 DIAGNOSIS — Z3A21 21 weeks gestation of pregnancy: Secondary | ICD-10-CM

## 2021-12-02 DIAGNOSIS — Z98891 History of uterine scar from previous surgery: Secondary | ICD-10-CM

## 2021-12-02 DIAGNOSIS — Z3480 Encounter for supervision of other normal pregnancy, unspecified trimester: Secondary | ICD-10-CM

## 2021-12-02 NOTE — Telephone Encounter (Signed)
Attempted to contact pt unable to leave message as message stating to try your call again later.   ? ?Re:  Inform pt of AFP scheduled after U/S appt on 12/11/21.  Ask if she wants to be in traditional appt as she has missed the last three appt.   ? ?Vedant Shehadeh,RN  ?12/02/21 ?

## 2021-12-03 NOTE — Telephone Encounter (Signed)
Called pt; message heard that call cannot be completed. MyChart message sent. ?

## 2021-12-11 ENCOUNTER — Ambulatory Visit: Payer: Medicaid Other | Attending: Maternal & Fetal Medicine

## 2021-12-11 ENCOUNTER — Other Ambulatory Visit: Payer: Medicaid Other

## 2021-12-11 ENCOUNTER — Ambulatory Visit: Payer: Medicaid Other | Admitting: *Deleted

## 2021-12-11 ENCOUNTER — Encounter: Payer: Self-pay | Admitting: *Deleted

## 2021-12-11 VITALS — BP 113/58 | HR 84

## 2021-12-11 DIAGNOSIS — Z363 Encounter for antenatal screening for malformations: Secondary | ICD-10-CM | POA: Diagnosis not present

## 2021-12-11 DIAGNOSIS — Z3480 Encounter for supervision of other normal pregnancy, unspecified trimester: Secondary | ICD-10-CM

## 2021-12-11 DIAGNOSIS — D563 Thalassemia minor: Secondary | ICD-10-CM

## 2021-12-11 DIAGNOSIS — Z3A23 23 weeks gestation of pregnancy: Secondary | ICD-10-CM | POA: Diagnosis not present

## 2021-12-11 DIAGNOSIS — Z148 Genetic carrier of other disease: Secondary | ICD-10-CM | POA: Insufficient documentation

## 2021-12-11 DIAGNOSIS — Z362 Encounter for other antenatal screening follow-up: Secondary | ICD-10-CM

## 2021-12-11 DIAGNOSIS — O285 Abnormal chromosomal and genetic finding on antenatal screening of mother: Secondary | ICD-10-CM

## 2021-12-11 DIAGNOSIS — O34219 Maternal care for unspecified type scar from previous cesarean delivery: Secondary | ICD-10-CM | POA: Diagnosis present

## 2021-12-17 ENCOUNTER — Other Ambulatory Visit: Payer: Medicaid Other

## 2021-12-17 DIAGNOSIS — Z202 Contact with and (suspected) exposure to infections with a predominantly sexual mode of transmission: Secondary | ICD-10-CM

## 2021-12-17 DIAGNOSIS — N898 Other specified noninflammatory disorders of vagina: Secondary | ICD-10-CM

## 2021-12-17 DIAGNOSIS — Z3480 Encounter for supervision of other normal pregnancy, unspecified trimester: Secondary | ICD-10-CM

## 2021-12-17 MED ORDER — TINIDAZOLE 500 MG PO TABS: 2.0000 g | ORAL_TABLET | Freq: Once | ORAL | 0 refills | Status: AC

## 2021-12-19 LAB — CULTURE, OB URINE

## 2021-12-19 LAB — URINE CULTURE, OB REFLEX

## 2021-12-24 LAB — AFP, SERUM, OPEN SPINA BIFIDA
AFP Value: 111 ng/mL
Gest. Age on Collection Date: 24.1 weeks
Maternal Age At EDD: 32.8 yr
Weight: 168 [lb_av]

## 2021-12-25 ENCOUNTER — Other Ambulatory Visit (HOSPITAL_COMMUNITY)
Admission: RE | Admit: 2021-12-25 | Discharge: 2021-12-25 | Disposition: A | Discharge status: Home / Self Care | Payer: Medicaid Other | Source: Ambulatory Visit | Attending: Family Medicine | Admitting: Family Medicine

## 2021-12-25 DIAGNOSIS — Z8619 Personal history of other infectious and parasitic diseases: Secondary | ICD-10-CM | POA: Insufficient documentation

## 2022-01-06 ENCOUNTER — Telehealth: Payer: Self-pay

## 2022-01-06 NOTE — Telephone Encounter (Signed)
Called pt and pt informed me that she apologized as she was going to call us last week but did not have time with childrens schedule.  I informed pt that she can now be in traditional OB appts as centering is not a good fit with her schedule.  Pt scheduled on 01/21/22 @ 115.  Pt also informed that her AFP was drawn to late to disregard the result.  Pt verbalized understanding with no further questions.  Pt also explained that she will now be in traditional visits and will no longer be a participant in Centering as we will miss her.  Pt verbalized understanding with no further questions.  ? ?Birdell Frasier,RN  ?01/06/22 ?

## 2022-01-09 ENCOUNTER — Other Ambulatory Visit: Payer: Self-pay

## 2022-01-09 ENCOUNTER — Ambulatory Visit (HOSPITAL_COMMUNITY)
Admission: EM | Admit: 2022-01-09 | Discharge: 2022-01-09 | Disposition: A | Discharge status: Home / Self Care | Payer: Medicaid Other | Attending: Family Medicine | Admitting: Family Medicine

## 2022-01-09 ENCOUNTER — Encounter (HOSPITAL_COMMUNITY): Payer: Self-pay | Admitting: Emergency Medicine

## 2022-01-09 DIAGNOSIS — J069 Acute upper respiratory infection, unspecified: Secondary | ICD-10-CM

## 2022-01-09 DIAGNOSIS — J029 Acute pharyngitis, unspecified: Secondary | ICD-10-CM

## 2022-01-09 LAB — POCT RAPID STREP A, ED / UC: Streptococcus, Group A Screen (Direct): NEGATIVE

## 2022-01-09 NOTE — Discharge Instructions (Signed)
You were seen today for sore throat.  Your strep test was negative.  This will be sent for culture and if anything is positive we will notify you to treat.  For now, this is likely viral, due to cough and sinus drainage.  Because you are pregnant, you may continue tylenol.  I also recommend salt water gargles, and chloroseptic throat spray.  You may trial over the counter claritin or zyrtec to help with sinus congestion and cough.

## 2022-01-09 NOTE — ED Provider Notes (Signed)
[facility]    CSN: [id number] Arrival date & time: 01/09/22  0848      History   Chief Complaint Chief Complaint  Patient presents with   Sore Throat    HPI Kara Fernandez is a 31 y.o. female.   Patient is here for sore throat x 3 days.  No fevers/chills. + runny nose, congestion.  No cough.  No n/v.  No headache.  7 months pregnant.  She has been taking tylenol only.   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   Hypertension    not while rpegnant   Ovarian cyst    Ovarian cyst    Trichomonal vulvovaginitis    UTI (lower urinary tract infection)    Vaginal Pap smear, abnormal    Yeast infection     Patient Active Problem List   Diagnosis Date Noted   Heart murmur 09/19/2021   Supervision of other normal pregnancy, antepartum 08/12/2021   Idiopathic intracranial hypertension 10/15/2017   Status post repeat low transverse cesarean section--due to [facility] 02/27/2015   History of chlamydia 12/29/2011   History of gonorrhea 12/29/2011   History of asthma - childhood 12/29/2011   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    Past Surgical History:  Procedure Laterality Date   CESAREAN SECTION  12/29/2011   Procedure: CESAREAN SECTION;  Surgeon: Purcell Nails, MD;  Location: [facility];  Service: Gynecology;  Laterality: N/A;   CESAREAN SECTION N/A 02/26/2015    Procedure: CESAREAN SECTION;  Surgeon: Osborn Coho, MD;  Location: [facility];  Service: Obstetrics;  Laterality: N/A;   DILATION AND CURETTAGE OF UTERUS     abortion    OB History     Gravida  4   Para  2   Term  2   Preterm      AB  1   Living  2      SAB      IAB  1   Ectopic      Multiple  0   Live Births  2            Home Medications    Prior to Admission medications   Medication Sig Start Date End Date Taking? Authorizing Provider  acetaminophen (TYLENOL) 325 MG tablet Take 650 mg by mouth every 6 (six) hours as needed. Patient not taking: Reported on 10/31/2021    [provider]  acetaZOLAMIDE (DIAMOX) 250 MG tablet Take 1 tablet (250 mg total) by mouth 2 (two) times daily. Patient not taking: Reported on 10/31/2021 09/19/21   Marylene Land, CNM  aspirin 81 MG chewable tablet Chew 1 tablet (81 mg total)  by mouth daily. Patient not taking: Reported on 12/11/2021 09/19/21   Marylene Land, CNM  Blood Pressure Monitoring DEVI 1 each by Does not apply route once a week. 09/10/21   Marylene Land, CNM  famotidine (PEPCID) 20 MG tablet Take 1 tablet (20 mg total) by mouth 2 (two) times daily. Patient not taking: Reported on 10/31/2021 08/13/21   Rolm Bookbinder, CNM  ondansetron (ZOFRAN-ODT) 8 MG disintegrating tablet Take 1 tablet (8 mg total) by mouth every 8 (eight) hours as needed for nausea or vomiting. 11/18/21   Reva Bores, MD  Prenat-FeCbn-FeAsp-Meth-FA-DHA (PRENATE MINI) 18-0.6-0.4-350 MG CAPS Take 1 capsule by mouth daily. Patient not taking: Reported on 10/31/2021 10/07/21   Leftwich-Kirby, Wilmer Floor, CNM  prochlorperazine (COMPAZINE) 10 MG tablet Take 1 tablet (10 mg total) by mouth 2 (two) times daily as needed for nausea or vomiting. Patient not taking: Reported on 10/31/2021 08/13/21   Rolm Bookbinder, CNM  promethazine (PHENERGAN) 25 MG tablet Take 1 tablet (25 mg total) by mouth every 6 (six) hours as needed  for nausea or vomiting. Patient not taking: Reported on 10/31/2021 08/13/21   Rolm Bookbinder, CNM  valACYclovir (VALTREX) 500 MG tablet Take 1 tablet (500 mg total) by mouth 2 (two) times daily as needed (for breakouts). Patient not taking: Reported on 10/31/2021 12/04/15   Sharen Counter A, CNM  potassium chloride (K-DUR) 10 MEQ tablet Take 1 tablet (10 mEq total) by mouth daily. 10/22/18 12/26/19  Jacalyn Lefevre, MD    Family History Family History  Problem Relation Age of Onset   Hypertension Mother    Hypertension Father    Hypertension Maternal Grandmother    Stroke Maternal Grandmother    Hypertension Maternal Grandfather    Hypertension Maternal Aunt    Hypertension Maternal Uncle    Alcohol abuse Maternal Uncle    Alcohol abuse Paternal Uncle     Social History Social History   Tobacco Use   Smoking status: Former   Smokeless tobacco: Never  Vaping Use   Vaping Use: Former  Substance Use Topics   Alcohol use: Not Currently   Drug use: Yes    Types: Marijuana    Comment: 06/2021     Allergies   Patient has no known allergies.   Review of Systems Review of Systems  Constitutional:  Negative for chills and fever.  HENT:  Positive for congestion, rhinorrhea and sore throat.   Respiratory: Negative.    Cardiovascular: Negative.   Gastrointestinal: Negative.   Genitourinary: Negative.     Physical Exam Triage Vital Signs ED Triage Vitals  Enc Vitals Group     BP 01/09/22 0915 106/70     Pulse Rate 01/09/22 0915 89     Resp 01/09/22 0915 (!) 22     Temp 01/09/22 0915 98.8 F (37.1 C)     Temp Source 01/09/22 0915 Oral     SpO2 01/09/22 0915 98 %     Weight --      Height --      Head Circumference --      Peak Flow --      Pain Score 01/09/22 0912 9     Pain Loc --      Pain Edu? --      Excl. in GC? --    No data found.  Updated Vital Signs BP 106/70 (BP Location: Right Arm)   Pulse 89   Temp 98.8 F (37.1 C) (Oral)   Resp Marland Kitchen)  22    LMP 07/01/2021   SpO2 98%   Visual Acuity Right Eye Distance:   Left Eye Distance:   Bilateral Distance:    Right Eye Near:   Left Eye Near:    Bilateral Near:     Physical Exam Constitutional:      Appearance: She is well-developed.  HENT:     Right Ear: Tympanic membrane normal.     Left Ear: Tympanic membrane normal.     Nose: Congestion and rhinorrhea present.     Mouth/Throat:     Mouth: Mucous membranes are moist.     Pharynx: Posterior oropharyngeal erythema present. No pharyngeal swelling or oropharyngeal exudate.     Tonsils: No tonsillar exudate or tonsillar abscesses.  Eyes:     Conjunctiva/sclera: Conjunctivae normal.  Cardiovascular:     Rate and Rhythm: Normal rate and regular rhythm.  Pulmonary:     Effort: Pulmonary effort is normal.  Abdominal:     Palpations: Abdomen is soft.  Musculoskeletal:     Cervical back: Normal range of motion and neck supple.  Lymphadenopathy:     Cervical: Cervical adenopathy present.  Neurological:     Mental Status: She is alert.     UC Treatments / Results  Labs (all labs ordered are listed, but only abnormal results are displayed) Labs Reviewed - No data to display  EKG   Radiology No results found.  Procedures Procedures (including critical care time)  Medications Ordered in UC Medications - No data to display  Initial Impression / Assessment and Plan / UC Course  I have reviewed the triage vital signs and the nursing notes.  Pertinent labs & imaging results that were available during my care of the patient were reviewed by me and considered in my medical decision making (see chart for details).    Final Clinical Impressions(s) / UC Diagnoses   Final diagnoses:  Sore throat  Upper respiratory tract infection, unspecified type     Discharge Instructions      You were seen today for sore throat.  Your strep test was negative.  This will be sent for culture and if anything is positive we will  notify you to treat.  For now, this is likely viral, due to cough and sinus drainage.  Because you are pregnant, you may continue tylenol.  I also recommend salt water gargles, and chloroseptic throat spray.  You may trial over the counter claritin or zyrtec to help with sinus congestion and cough.     ED Prescriptions   None    PDMP not reviewed this encounter.   Jannifer FranklinPiontek, Elaya Droege, MD 01/09/22 1005

## 2022-01-09 NOTE — ED Triage Notes (Signed)
Patient's sore throat started 3 days ago.  Patient has been drinking warm tea.  Patient is pregnant.

## 2022-01-12 LAB — CULTURE, GROUP A STREP (THRC)

## 2022-01-21 ENCOUNTER — Ambulatory Visit (INDEPENDENT_AMBULATORY_CARE_PROVIDER_SITE_OTHER): Payer: Self-pay | Admitting: Family Medicine

## 2022-01-21 ENCOUNTER — Encounter: Payer: Self-pay | Admitting: Family Medicine

## 2022-01-21 VITALS — BP 110/61 | HR 103 | Wt 166.0 lb

## 2022-01-21 DIAGNOSIS — Z8619 Personal history of other infectious and parasitic diseases: Secondary | ICD-10-CM

## 2022-01-21 DIAGNOSIS — B009 Herpesviral infection, unspecified: Secondary | ICD-10-CM

## 2022-01-21 DIAGNOSIS — Z3480 Encounter for supervision of other normal pregnancy, unspecified trimester: Secondary | ICD-10-CM

## 2022-01-21 DIAGNOSIS — Z98891 History of uterine scar from previous surgery: Secondary | ICD-10-CM

## 2022-01-21 DIAGNOSIS — Z3A29 29 weeks gestation of pregnancy: Secondary | ICD-10-CM

## 2022-01-21 NOTE — Progress Notes (Signed)
    Subjective:  Kara Fernandez is a 31 y.o. [phone] at [redacted]w[redacted]d being seen today for ongoing prenatal care.  She is currently monitored for the following issues for this low-risk pregnancy and has Herpes simplex type 2 infection; History of chlamydia; History of gonorrhea; History of asthma - childhood; Status post repeat low transverse cesarean section--due to [facility]; Supervision of other normal pregnancy, antepartum; Heart murmur; and Idiopathic intracranial hypertension on their problem list.  Patient reports no complaints.  Contractions: Not present. Vag. Bleeding: None.  Movement: Present. Denies leaking of fluid.   The following portions of the patient's history were reviewed and updated as appropriate: allergies, current medications, past family history, past medical history, past social history, past surgical history and problem list.   Objective:   Vitals:   01/21/22 1320  BP: 110/61  Pulse: (!) 103  Weight: 166 lb (75.3 kg)    Fetal Status: Fetal Heart Rate (bpm): 148 Fundal Height: 29 cm Movement: Present     General:  Alert, oriented and cooperative. Patient is in no acute distress.  Skin: Skin is warm and dry. No rash noted.   Cardiovascular: Normal heart rate noted  Respiratory: Normal respiratory effort, no problems with respiration noted  Abdomen: Soft, gravid, appropriate for gestational age. Pain/Pressure: Absent     Pelvic:  Cervical exam deferred        Extremities: Normal range of motion.     Mental Status: Normal mood and affect. Normal behavior. Normal judgment and thought content.    Assessment and Plan:  Pregnancy: [id number] at [redacted]w[redacted]d  1. Supervision of other normal pregnancy, antepartum Doing well with normal fetal movement.   2. [redacted] weeks gestation of pregnancy  3. History of chlamydia TOC today--last exposed and treated 10/31/21. No symptoms or known exposure since then.  - Cervicovaginal ancillary only( [place])  4. Herpes simplex type 2  infection No lesions, plan for ppx at 36 weeks.   5. Previous cesarean section   Preterm labor symptoms and general obstetric precautions including but not limited to vaginal bleeding, contractions, leaking of fluid and fetal movement were reviewed in detail with the patient. Please refer to After Visit Summary for other counseling recommendations.   Return in about 2 weeks (around 02/04/2022) for LROB.   Allayne Stack, DO

## 2022-01-22 LAB — CERVICOVAGINAL ANCILLARY ONLY
Chlamydia: NEGATIVE
Comment: NEGATIVE
Comment: NEGATIVE
Comment: NORMAL
Neisseria Gonorrhea: NEGATIVE
Trichomonas: NEGATIVE

## 2022-02-04 ENCOUNTER — Encounter: Payer: Medicaid Other | Admitting: Certified Nurse Midwife

## 2022-02-10 ENCOUNTER — Ambulatory Visit (INDEPENDENT_AMBULATORY_CARE_PROVIDER_SITE_OTHER): Payer: Medicaid Other | Admitting: Obstetrics & Gynecology

## 2022-02-10 ENCOUNTER — Encounter: Payer: Self-pay | Admitting: Obstetrics & Gynecology

## 2022-02-10 VITALS — BP 107/62 | HR 90 | Wt 170.1 lb

## 2022-02-10 DIAGNOSIS — Z3A32 32 weeks gestation of pregnancy: Secondary | ICD-10-CM

## 2022-02-10 DIAGNOSIS — Z98891 History of uterine scar from previous surgery: Secondary | ICD-10-CM

## 2022-02-10 DIAGNOSIS — G932 Benign intracranial hypertension: Secondary | ICD-10-CM

## 2022-02-10 DIAGNOSIS — Z3480 Encounter for supervision of other normal pregnancy, unspecified trimester: Secondary | ICD-10-CM

## 2022-02-10 NOTE — Progress Notes (Signed)
   PRENATAL VISIT NOTE  Subjective:  Kara Fernandez is a 31 y.o. [phone] at [redacted]w[redacted]d being seen today for ongoing prenatal care.  She is currently monitored for the following issues for this high-risk pregnancy and has Herpes simplex type 2 infection; History of chlamydia; History of gonorrhea; History of asthma - childhood; Status post repeat low transverse cesarean section--due to [facility]; Supervision of other normal pregnancy, antepartum; Heart murmur; and Idiopathic intracranial hypertension on their problem list.  Patient reports no complaints.  Contractions: Irritability. Vag. Bleeding: None.  Movement: Present. Denies leaking of fluid.   The following portions of the patient's history were reviewed and updated as appropriate: allergies, current medications, past family history, past medical history, past social history, past surgical history and problem list.   Objective:   Vitals:   02/10/22 0816  BP: 107/62  Pulse: 90  Weight: 170 lb 1.6 oz (77.2 kg)    Fetal Status: Fetal Heart Rate (bpm): 138   Movement: Present     General:  Alert, oriented and cooperative. Patient is in no acute distress.  Skin: Skin is warm and dry. No rash noted.   Cardiovascular: Normal heart rate noted  Respiratory: Normal respiratory effort, no problems with respiration noted  Abdomen: Soft, gravid, appropriate for gestational age.  Pain/Pressure: Present     Pelvic: Cervical exam deferred        Extremities: Normal range of motion.  Edema: Trace  Mental Status: Normal mood and affect. Normal behavior. Normal judgment and thought content.   Assessment and Plan:  Pregnancy: [id number] at [redacted]w[redacted]d 1. Supervision of other normal pregnancy, antepartum Doing well  2. Status post repeat low transverse cesarean section--due to [facility] Discussed increased risk of uterine rupture and she reviewed consent form. Agrees to schedule RCS 39 weeks  3. Idiopathic intracranial hypertension   Preterm labor symptoms  and general obstetric precautions including but not limited to vaginal bleeding, contractions, leaking of fluid and fetal movement were reviewed in detail with the patient. Please refer to After Visit Summary for other counseling recommendations.   Return in about 2 weeks (around 02/24/2022).  No future appointments.  Scheryl Darter, MD

## 2022-02-24 ENCOUNTER — Encounter: Payer: Self-pay | Admitting: Obstetrics and Gynecology

## 2022-02-24 ENCOUNTER — Ambulatory Visit (INDEPENDENT_AMBULATORY_CARE_PROVIDER_SITE_OTHER): Payer: Self-pay | Admitting: Obstetrics and Gynecology

## 2022-02-24 VITALS — BP 107/64 | HR 91 | Wt 169.9 lb

## 2022-02-24 DIAGNOSIS — G932 Benign intracranial hypertension: Secondary | ICD-10-CM

## 2022-02-24 DIAGNOSIS — D563 Thalassemia minor: Secondary | ICD-10-CM | POA: Insufficient documentation

## 2022-02-24 DIAGNOSIS — Z98891 History of uterine scar from previous surgery: Secondary | ICD-10-CM

## 2022-02-24 DIAGNOSIS — B009 Herpesviral infection, unspecified: Secondary | ICD-10-CM

## 2022-02-24 DIAGNOSIS — Z3A34 34 weeks gestation of pregnancy: Secondary | ICD-10-CM

## 2022-02-24 NOTE — Progress Notes (Signed)
   PRENATAL VISIT NOTE  Subjective:  Kara Fernandez is a 31 y.o. [phone] at [redacted]w[redacted]d being seen today for ongoing prenatal care.  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  Patient reports no complaints.  Contractions: Irritability. Vag. Bleeding: None.  Movement: Present. Denies leaking of fluid.   The following portions of the patient's history were reviewed and updated as appropriate: allergies, current medications, past family history, past medical history, past social history, past surgical history and problem list.   Objective:   Vitals:   02/24/22 0819  BP: 107/64  Pulse: 91  Weight: 169 lb 14.4 oz (77.1 kg)    Fetal Status: Fetal Heart Rate (bpm): 142 Fundal Height: 34 cm Movement: Present  Presentation: Vertex  General:  Alert, oriented and cooperative. Patient is in no acute distress.  Skin: Skin is warm and dry. No rash noted.   Cardiovascular: Normal heart rate noted  Respiratory: Normal respiratory effort, no problems with respiration noted  Abdomen: Soft, gravid, appropriate for gestational age.  Pain/Pressure: Absent     Pelvic: Cervical exam deferred        Extremities: Normal range of motion.  Edema: Trace  Mental Status: Normal mood and affect. Normal behavior. Normal judgment and thought content.   Assessment and Plan:  Pregnancy: [id number] at [redacted]w[redacted]d 1. [redacted] weeks gestation of pregnancy GBS swabs next visit Depo provera  2. Carrier of beta thalassemia  3. Idiopathic intracranial hypertension No issues on no meds  4. H/o c-section x 2 Already scheduled for repeat  5. Herpes simplex type 2  infection Recommend ppx starting next visit  Preterm labor symptoms and general obstetric precautions including but not limited to vaginal bleeding, contractions, leaking of fluid and fetal movement were reviewed in detail with the patient. Please refer to After Visit Summary for other counseling recommendations.   Return in about 2 weeks (around 03/10/2022) for in person, low risk ob, md or app.  No future appointments.  Bearden Bing, MD

## 2022-03-06 ENCOUNTER — Other Ambulatory Visit: Payer: Self-pay | Admitting: Family Medicine

## 2022-03-06 DIAGNOSIS — Z98891 History of uterine scar from previous surgery: Secondary | ICD-10-CM

## 2022-03-10 ENCOUNTER — Other Ambulatory Visit: Payer: Self-pay

## 2022-03-10 ENCOUNTER — Other Ambulatory Visit (HOSPITAL_COMMUNITY)
Admission: RE | Admit: 2022-03-10 | Discharge: 2022-03-10 | Disposition: A | Discharge status: Home / Self Care | Payer: Medicaid Other | Source: Ambulatory Visit | Attending: Obstetrics and Gynecology | Admitting: Obstetrics and Gynecology

## 2022-03-10 ENCOUNTER — Ambulatory Visit (INDEPENDENT_AMBULATORY_CARE_PROVIDER_SITE_OTHER): Payer: Medicaid Other | Admitting: Obstetrics and Gynecology

## 2022-03-10 VITALS — BP 125/76 | HR 98 | Wt 172.8 lb

## 2022-03-10 DIAGNOSIS — B009 Herpesviral infection, unspecified: Secondary | ICD-10-CM

## 2022-03-10 DIAGNOSIS — Z3A36 36 weeks gestation of pregnancy: Secondary | ICD-10-CM | POA: Insufficient documentation

## 2022-03-10 DIAGNOSIS — Z8619 Personal history of other infectious and parasitic diseases: Secondary | ICD-10-CM | POA: Insufficient documentation

## 2022-03-10 DIAGNOSIS — Z3483 Encounter for supervision of other normal pregnancy, third trimester: Secondary | ICD-10-CM | POA: Insufficient documentation

## 2022-03-10 DIAGNOSIS — Z3480 Encounter for supervision of other normal pregnancy, unspecified trimester: Secondary | ICD-10-CM

## 2022-03-10 DIAGNOSIS — Z23 Encounter for immunization: Secondary | ICD-10-CM | POA: Diagnosis not present

## 2022-03-10 DIAGNOSIS — Z98891 History of uterine scar from previous surgery: Secondary | ICD-10-CM

## 2022-03-10 DIAGNOSIS — G932 Benign intracranial hypertension: Secondary | ICD-10-CM

## 2022-03-10 NOTE — Progress Notes (Signed)
   PRENATAL VISIT NOTE  Subjective:  Kara Fernandez is a 31 y.o. [phone] at [redacted]w[redacted]d being seen today for ongoing prenatal care.  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  Patient reports no complaints.  Contractions: Irritability. Vag. Bleeding: None.  Movement: Present. Denies leaking of fluid.   The following portions of the patient's history were reviewed and updated as appropriate: allergies, current medications, past family history, past medical history, past social history, past surgical history and problem list.   Objective:   Vitals:   03/10/22 0850  BP: 125/76  Pulse: 98  Weight: 172 lb 12.8 oz (78.4 kg)    Fetal Status: Fetal Heart Rate (bpm): 152 Fundal Height: 36 cm Movement: Present  Presentation: Vertex  General:  Alert, oriented and cooperative. Patient is in no acute distress.  Skin: Skin is warm and dry. No rash noted.   Cardiovascular: Normal heart rate noted  Respiratory: Normal respiratory effort, no problems with respiration noted  Abdomen: Soft, gravid, appropriate for gestational age.  Pain/Pressure: Present     Pelvic: Cervical exam deferred        Extremities: Normal range of motion.  Edema: Trace  Mental Status: Normal mood and affect. Normal behavior. Normal judgment and thought content.   Assessment and Plan:  Pregnancy: [id number] at [redacted]w[redacted]d 1. [redacted] weeks gestation of pregnancy depo - Tdap vaccine greater than or equal to 7yo IM - Culture, beta strep (group b only) - GC/Chlamydia probe amp ([place])not at [facility] - Cervicovaginal ancillary only( [place])  2. Supervision of  other normal pregnancy, antepartum  3. History of trichomoniasis - Cervicovaginal ancillary only( )  4. Status post repeat low transverse cesarean section--due to [facility] For rpt (already scheduled)  5. History of gonorrhea  6. History of chlamydia  7. Herpes simplex type 2 infection Pt amenable to starting ppx today  Preterm labor symptoms and general obstetric precautions including but not limited to vaginal bleeding, contractions, leaking of fluid and fetal movement were reviewed in detail with the patient. Please refer to After Visit Summary for other counseling recommendations.   Return in about 1 week (around 03/17/2022) for 7-10d, low risk ob, md or app, in person.  Future Appointments  Date Time Provider Department Center  03/17/2022  9:15 AM Kathlene Cote [facility]  03/24/2022  8:35 AM Adam Phenix, MD [facility]  03/31/2022  8:15 AM Corlis Hove, NP [facility]  04/07/2022  8:15 AM Corlis Hove, NP [facility]  04/14/2022  8:15 AM [facility]  04/14/2022  9:35 AM Calvert Cantor, CNM [facility]    [place] Bing, MD

## 2022-03-12 LAB — CERVICOVAGINAL ANCILLARY ONLY
Chlamydia: NEGATIVE
Comment: NEGATIVE
Comment: NEGATIVE
Comment: NORMAL
Neisseria Gonorrhea: NEGATIVE
Trichomonas: NEGATIVE

## 2022-03-14 LAB — CULTURE, BETA STREP (GROUP B ONLY): Strep Gp B Culture: NEGATIVE

## 2022-03-17 ENCOUNTER — Ambulatory Visit (INDEPENDENT_AMBULATORY_CARE_PROVIDER_SITE_OTHER): Payer: Medicaid Other | Admitting: Medical

## 2022-03-17 ENCOUNTER — Inpatient Hospital Stay (HOSPITAL_COMMUNITY)
Admission: AD | Admit: 2022-03-17 | Discharge: 2022-03-20 | DRG: 786 | Disposition: A | Discharge status: Home / Self Care | Payer: Medicaid Other | Attending: Obstetrics and Gynecology | Admitting: Obstetrics and Gynecology

## 2022-03-17 ENCOUNTER — Other Ambulatory Visit: Payer: Self-pay

## 2022-03-17 ENCOUNTER — Encounter (HOSPITAL_COMMUNITY)
Admission: AD | Disposition: A | Discharge status: Home / Self Care | Payer: Self-pay | Source: Home / Self Care | Attending: Obstetrics and Gynecology

## 2022-03-17 ENCOUNTER — Inpatient Hospital Stay (HOSPITAL_COMMUNITY): Payer: Medicaid Other

## 2022-03-17 ENCOUNTER — Encounter: Payer: Self-pay | Admitting: Medical

## 2022-03-17 ENCOUNTER — Encounter (HOSPITAL_COMMUNITY): Payer: Self-pay | Admitting: Obstetrics and Gynecology

## 2022-03-17 VITALS — BP 112/64 | HR 98 | Wt 174.7 lb

## 2022-03-17 DIAGNOSIS — O34219 Maternal care for unspecified type scar from previous cesarean delivery: Secondary | ICD-10-CM | POA: Diagnosis present

## 2022-03-17 DIAGNOSIS — Z8709 Personal history of other diseases of the respiratory system: Secondary | ICD-10-CM | POA: Diagnosis not present

## 2022-03-17 DIAGNOSIS — O4292 Full-term premature rupture of membranes, unspecified as to length of time between rupture and onset of labor: Secondary | ICD-10-CM | POA: Diagnosis present

## 2022-03-17 DIAGNOSIS — D62 Acute posthemorrhagic anemia: Secondary | ICD-10-CM | POA: Diagnosis not present

## 2022-03-17 DIAGNOSIS — A6000 Herpesviral infection of urogenital system, unspecified: Secondary | ICD-10-CM | POA: Diagnosis present

## 2022-03-17 DIAGNOSIS — O164 Unspecified maternal hypertension, complicating childbirth: Secondary | ICD-10-CM

## 2022-03-17 DIAGNOSIS — O9081 Anemia of the puerperium: Secondary | ICD-10-CM | POA: Diagnosis not present

## 2022-03-17 DIAGNOSIS — O99214 Obesity complicating childbirth: Secondary | ICD-10-CM | POA: Diagnosis present

## 2022-03-17 DIAGNOSIS — O411230 Chorioamnionitis, third trimester, not applicable or unspecified: Secondary | ICD-10-CM | POA: Diagnosis present

## 2022-03-17 DIAGNOSIS — Z98891 History of uterine scar from previous surgery: Secondary | ICD-10-CM | POA: Insufficient documentation

## 2022-03-17 DIAGNOSIS — O9832 Other infections with a predominantly sexual mode of transmission complicating childbirth: Secondary | ICD-10-CM | POA: Diagnosis present

## 2022-03-17 DIAGNOSIS — D563 Thalassemia minor: Secondary | ICD-10-CM | POA: Diagnosis present

## 2022-03-17 DIAGNOSIS — O4290 Premature rupture of membranes, unspecified as to length of time between rupture and onset of labor, unspecified weeks of gestation: Secondary | ICD-10-CM

## 2022-03-17 DIAGNOSIS — O99354 Diseases of the nervous system complicating childbirth: Secondary | ICD-10-CM | POA: Diagnosis present

## 2022-03-17 DIAGNOSIS — Z3480 Encounter for supervision of other normal pregnancy, unspecified trimester: Secondary | ICD-10-CM | POA: Diagnosis not present

## 2022-03-17 DIAGNOSIS — O9822 Gonorrhea complicating childbirth: Secondary | ICD-10-CM | POA: Diagnosis not present

## 2022-03-17 DIAGNOSIS — D561 Beta thalassemia: Secondary | ICD-10-CM | POA: Diagnosis present

## 2022-03-17 DIAGNOSIS — B009 Herpesviral infection, unspecified: Secondary | ICD-10-CM

## 2022-03-17 DIAGNOSIS — Z3A37 37 weeks gestation of pregnancy: Secondary | ICD-10-CM

## 2022-03-17 DIAGNOSIS — O34211 Maternal care for low transverse scar from previous cesarean delivery: Principal | ICD-10-CM | POA: Diagnosis present

## 2022-03-17 DIAGNOSIS — Z87891 Personal history of nicotine dependence: Secondary | ICD-10-CM

## 2022-03-17 DIAGNOSIS — G932 Benign intracranial hypertension: Secondary | ICD-10-CM | POA: Diagnosis present

## 2022-03-17 LAB — POCT FERN TEST: POCT Fern Test: POSITIVE

## 2022-03-17 LAB — RPR: RPR Ser Ql: NONREACTIVE

## 2022-03-17 LAB — CBC
HCT: 36.4 % (ref 36.0–46.0)
Hemoglobin: 11.1 g/dL — ABNORMAL LOW (ref 12.0–15.0)
MCH: 20.8 pg — ABNORMAL LOW (ref 26.0–34.0)
MCHC: 30.5 g/dL (ref 30.0–36.0)
MCV: 68.2 fL — ABNORMAL LOW (ref 80.0–100.0)
Platelets: 327 10*3/uL (ref 150–400)
RBC: 5.34 MIL/uL — ABNORMAL HIGH (ref 3.87–5.11)
RDW: 17.5 % — ABNORMAL HIGH (ref 11.5–15.5)
WBC: 16.7 10*3/uL — ABNORMAL HIGH (ref 4.0–10.5)
nRBC: 0.3 % — ABNORMAL HIGH (ref 0.0–0.2)

## 2022-03-17 SURGERY — Surgical Case
Anesthesia: Spinal

## 2022-03-17 MED ORDER — MORPHINE SULFATE (PF) 0.5 MG/ML IJ SOLN
INTRAMUSCULAR | Status: DC | PRN
  Administered 2022-03-17: 150 mg via INTRATHECAL

## 2022-03-17 MED ORDER — DEXMEDETOMIDINE (PRECEDEX) IN NS 20 MCG/5ML (4 MCG/ML) IV SYRINGE
PREFILLED_SYRINGE | INTRAVENOUS | Status: DC | PRN
  Administered 2022-03-17 (×3): 8 ug via INTRAVENOUS

## 2022-03-17 MED ORDER — FAMOTIDINE IN NACL 20-0.9 MG/50ML-% IV SOLN
20.0000 mg | Freq: Once | INTRAVENOUS | Status: AC
  Administered 2022-03-17: 20 mg via INTRAVENOUS
  Filled 2022-03-17: qty 50

## 2022-03-17 MED ORDER — KETOROLAC TROMETHAMINE 30 MG/ML IJ SOLN: 30.0000 mg | Freq: Once | INTRAMUSCULAR | Status: DC

## 2022-03-17 MED ORDER — CEFAZOLIN SODIUM-DEXTROSE 2-4 GM/100ML-% IV SOLN
2.0000 g | INTRAVENOUS | Status: AC
  Administered 2022-03-17: 2 g via INTRAVENOUS
  Filled 2022-03-17: qty 100

## 2022-03-17 MED ORDER — DIPHENHYDRAMINE HCL 25 MG PO CAPS
25.0000 mg | ORAL_CAPSULE | Freq: Four times a day (QID) | ORAL | Status: DC | PRN
  Administered 2022-03-17: 25 mg via ORAL

## 2022-03-17 MED ORDER — ACETAMINOPHEN 10 MG/ML IV SOLN
INTRAVENOUS | Status: DC | PRN
  Administered 2022-03-17: 1000 mg via INTRAVENOUS

## 2022-03-17 MED ORDER — GABAPENTIN 100 MG PO CAPS
200.0000 mg | ORAL_CAPSULE | Freq: Every day | ORAL | Status: DC
  Administered 2022-03-17 – 2022-03-19 (×3): 200 mg via ORAL
  Filled 2022-03-17 (×3): qty 2

## 2022-03-17 MED ORDER — SODIUM CHLORIDE 0.9 % IV SOLN: INTRAVENOUS | Status: DC | PRN

## 2022-03-17 MED ORDER — MENTHOL 3 MG MT LOZG: 1.0000 | LOZENGE | OROMUCOSAL | Status: DC | PRN

## 2022-03-17 MED ORDER — LACTATED RINGERS IV SOLN: INTRAVENOUS | Status: DC

## 2022-03-17 MED ORDER — MEPERIDINE HCL 25 MG/ML IJ SOLN: 6.2500 mg | INTRAMUSCULAR | Status: DC | PRN

## 2022-03-17 MED ORDER — SUCCINYLCHOLINE CHLORIDE 200 MG/10ML IV SOSY
PREFILLED_SYRINGE | INTRAVENOUS | Status: AC
  Filled 2022-03-17: qty 20

## 2022-03-17 MED ORDER — KETOROLAC TROMETHAMINE 30 MG/ML IJ SOLN: 30.0000 mg | Freq: Four times a day (QID) | INTRAMUSCULAR | Status: DC | PRN

## 2022-03-17 MED ORDER — ZOLPIDEM TARTRATE 5 MG PO TABS: 5.0000 mg | ORAL_TABLET | Freq: Every evening | ORAL | Status: DC | PRN

## 2022-03-17 MED ORDER — PROPOFOL 10 MG/ML IV BOLUS
INTRAVENOUS | Status: AC
  Filled 2022-03-17: qty 40

## 2022-03-17 MED ORDER — ACETAMINOPHEN 10 MG/ML IV SOLN
INTRAVENOUS | Status: AC
  Filled 2022-03-17: qty 100

## 2022-03-17 MED ORDER — TERBUTALINE SULFATE 1 MG/ML IJ SOLN
INTRAMUSCULAR | Status: AC
  Administered 2022-03-17: 0.25 mg via SUBCUTANEOUS
  Filled 2022-03-17: qty 1

## 2022-03-17 MED ORDER — FENTANYL CITRATE (PF) 100 MCG/2ML IJ SOLN
INTRAMUSCULAR | Status: AC
  Filled 2022-03-17: qty 2

## 2022-03-17 MED ORDER — KETOROLAC TROMETHAMINE 30 MG/ML IJ SOLN
INTRAMUSCULAR | Status: AC
  Filled 2022-03-17: qty 1

## 2022-03-17 MED ORDER — MORPHINE SULFATE (PF) 0.5 MG/ML IJ SOLN
INTRAMUSCULAR | Status: AC
  Filled 2022-03-17: qty 10

## 2022-03-17 MED ORDER — ACETAMINOPHEN 500 MG PO TABS
1000.0000 mg | ORAL_TABLET | Freq: Four times a day (QID) | ORAL | Status: DC
  Administered 2022-03-17 – 2022-03-20 (×10): 1000 mg via ORAL
  Filled 2022-03-17 (×10): qty 2

## 2022-03-17 MED ORDER — IBUPROFEN 600 MG PO TABS
600.0000 mg | ORAL_TABLET | Freq: Four times a day (QID) | ORAL | Status: DC
Start: 2022-03-18 — End: 2022-03-20
  Administered 2022-03-18 – 2022-03-20 (×10): 600 mg via ORAL
  Filled 2022-03-17 (×9): qty 1

## 2022-03-17 MED ORDER — SIMETHICONE 80 MG PO CHEW
80.0000 mg | CHEWABLE_TABLET | Freq: Three times a day (TID) | ORAL | Status: DC
  Administered 2022-03-18 – 2022-03-20 (×5): 80 mg via ORAL
  Filled 2022-03-17 (×6): qty 1

## 2022-03-17 MED ORDER — SIMETHICONE 80 MG PO CHEW
80.0000 mg | CHEWABLE_TABLET | ORAL | Status: DC | PRN
  Administered 2022-03-18: 80 mg via ORAL

## 2022-03-17 MED ORDER — TERBUTALINE SULFATE 1 MG/ML IJ SOLN: 0.2500 mg | Freq: Once | INTRAMUSCULAR | Status: AC

## 2022-03-17 MED ORDER — OXYTOCIN-SODIUM CHLORIDE 30-0.9 UT/500ML-% IV SOLN: 2.5000 [IU]/h | INTRAVENOUS | Status: AC

## 2022-03-17 MED ORDER — POVIDONE-IODINE 10 % EX SWAB: 2.0000 | Freq: Once | CUTANEOUS | Status: DC

## 2022-03-17 MED ORDER — TETANUS-DIPHTH-ACELL PERTUSSIS 5-2.5-18.5 LF-MCG/0.5 IM SUSY: 0.5000 mL | PREFILLED_SYRINGE | Freq: Once | INTRAMUSCULAR | Status: DC

## 2022-03-17 MED ORDER — DIPHENHYDRAMINE HCL 25 MG PO CAPS
25.0000 mg | ORAL_CAPSULE | ORAL | Status: DC | PRN
  Filled 2022-03-17: qty 1

## 2022-03-17 MED ORDER — SODIUM CHLORIDE 0.9% FLUSH: 3.0000 mL | INTRAVENOUS | Status: DC | PRN

## 2022-03-17 MED ORDER — SENNOSIDES-DOCUSATE SODIUM 8.6-50 MG PO TABS
2.0000 | ORAL_TABLET | Freq: Every day | ORAL | Status: DC
  Administered 2022-03-18 – 2022-03-20 (×3): 2 via ORAL
  Filled 2022-03-17 (×3): qty 2

## 2022-03-17 MED ORDER — NALOXONE HCL 0.4 MG/ML IJ SOLN: 0.4000 mg | INTRAMUSCULAR | Status: DC | PRN

## 2022-03-17 MED ORDER — CEFAZOLIN SODIUM-DEXTROSE 2-4 GM/100ML-% IV SOLN: 2.0000 g | INTRAVENOUS | Status: DC

## 2022-03-17 MED ORDER — ENOXAPARIN SODIUM 40 MG/0.4ML IJ SOSY
40.0000 mg | PREFILLED_SYRINGE | INTRAMUSCULAR | Status: DC
  Administered 2022-03-18 – 2022-03-20 (×3): 40 mg via SUBCUTANEOUS
  Filled 2022-03-17 (×3): qty 0.4

## 2022-03-17 MED ORDER — LACTATED RINGERS IV BOLUS
1000.0000 mL | Freq: Once | INTRAVENOUS | Status: AC
  Administered 2022-03-17: 1000 mL via INTRAVENOUS

## 2022-03-17 MED ORDER — PHENYLEPHRINE HCL-NACL 20-0.9 MG/250ML-% IV SOLN
INTRAVENOUS | Status: DC | PRN
  Administered 2022-03-17: 60 ug/min via INTRAVENOUS

## 2022-03-17 MED ORDER — DEXMEDETOMIDINE HCL IN NACL 80 MCG/20ML IV SOLN
INTRAVENOUS | Status: AC
  Filled 2022-03-17: qty 20

## 2022-03-17 MED ORDER — FENTANYL CITRATE (PF) 100 MCG/2ML IJ SOLN
INTRAMUSCULAR | Status: DC | PRN
Start: 2022-03-17 — End: 2022-03-17
  Administered 2022-03-17: 15 ug via INTRATHECAL

## 2022-03-17 MED ORDER — FENTANYL CITRATE (PF) 100 MCG/2ML IJ SOLN
25.0000 ug | INTRAMUSCULAR | Status: DC | PRN
  Administered 2022-03-17: 50 ug via INTRAVENOUS

## 2022-03-17 MED ORDER — ONDANSETRON HCL 4 MG/2ML IJ SOLN
INTRAMUSCULAR | Status: AC
  Filled 2022-03-17: qty 2

## 2022-03-17 MED ORDER — KETOROLAC TROMETHAMINE 30 MG/ML IJ SOLN
30.0000 mg | Freq: Four times a day (QID) | INTRAMUSCULAR | Status: DC | PRN
  Administered 2022-03-17: 30 mg via INTRAMUSCULAR

## 2022-03-17 MED ORDER — DEXAMETHASONE SODIUM PHOSPHATE 4 MG/ML IJ SOLN
INTRAMUSCULAR | Status: DC | PRN
  Administered 2022-03-17: 4 mg via INTRAVENOUS

## 2022-03-17 MED ORDER — ONDANSETRON HCL 4 MG/2ML IJ SOLN: 4.0000 mg | Freq: Three times a day (TID) | INTRAMUSCULAR | Status: DC | PRN

## 2022-03-17 MED ORDER — DIPHENHYDRAMINE HCL 50 MG/ML IJ SOLN: 12.5000 mg | INTRAMUSCULAR | Status: DC | PRN

## 2022-03-17 MED ORDER — DIBUCAINE (PERIANAL) 1 % EX OINT: 1.0000 | TOPICAL_OINTMENT | CUTANEOUS | Status: DC | PRN

## 2022-03-17 MED ORDER — NALOXONE HCL 4 MG/10ML IJ SOLN: 1.0000 ug/kg/h | INTRAVENOUS | Status: DC | PRN

## 2022-03-17 MED ORDER — SOD CITRATE-CITRIC ACID 500-334 MG/5ML PO SOLN
30.0000 mL | Freq: Once | ORAL | Status: AC
  Administered 2022-03-17: 30 mL via ORAL
  Filled 2022-03-17: qty 30

## 2022-03-17 MED ORDER — SODIUM CHLORIDE 0.9 % IV SOLN
500.0000 mg | Freq: Once | INTRAVENOUS | Status: AC
  Administered 2022-03-17: 500 mg via INTRAVENOUS
  Filled 2022-03-17: qty 5

## 2022-03-17 MED ORDER — SODIUM CHLORIDE 0.9 % IR SOLN
Status: DC | PRN
  Administered 2022-03-17: 1

## 2022-03-17 MED ORDER — FENTANYL CITRATE (PF) 100 MCG/2ML IJ SOLN
INTRAMUSCULAR | Status: DC | PRN
  Administered 2022-03-17: 50 ug via INTRAVENOUS
  Administered 2022-03-17: 85 ug via INTRAVENOUS
  Administered 2022-03-17: 50 ug via INTRAVENOUS

## 2022-03-17 MED ORDER — COCONUT OIL OIL: 1.0000 | TOPICAL_OIL | Status: DC | PRN

## 2022-03-17 MED ORDER — BUPIVACAINE IN DEXTROSE 0.75-8.25 % IT SOLN
INTRATHECAL | Status: DC | PRN
  Administered 2022-03-17: 1.6 mL via INTRATHECAL

## 2022-03-17 MED ORDER — ONDANSETRON HCL 4 MG/2ML IJ SOLN
INTRAMUSCULAR | Status: DC | PRN
  Administered 2022-03-17: 4 mg via INTRAVENOUS

## 2022-03-17 MED ORDER — OXYTOCIN-SODIUM CHLORIDE 30-0.9 UT/500ML-% IV SOLN: INTRAVENOUS | Status: DC | PRN

## 2022-03-17 MED ORDER — ROCURONIUM BROMIDE 10 MG/ML (PF) SYRINGE
PREFILLED_SYRINGE | INTRAVENOUS | Status: AC
  Filled 2022-03-17: qty 10

## 2022-03-17 MED ORDER — KETOROLAC TROMETHAMINE 30 MG/ML IJ SOLN
30.0000 mg | Freq: Four times a day (QID) | INTRAMUSCULAR | Status: AC
  Administered 2022-03-17: 30 mg via INTRAVENOUS
  Filled 2022-03-17: qty 1

## 2022-03-17 MED ORDER — SODIUM CHLORIDE 0.9 % IV SOLN
3.0000 g | Freq: Four times a day (QID) | INTRAVENOUS | Status: AC
  Administered 2022-03-17 – 2022-03-18 (×4): 3 g via INTRAVENOUS
  Filled 2022-03-17 (×4): qty 8

## 2022-03-17 MED ORDER — PRENATAL MULTIVITAMIN CH
1.0000 | ORAL_TABLET | Freq: Every day | ORAL | Status: DC
  Administered 2022-03-18 – 2022-03-20 (×2): 1 via ORAL
  Filled 2022-03-17 (×3): qty 1

## 2022-03-17 MED ORDER — STERILE WATER FOR IRRIGATION IR SOLN
Status: DC | PRN
  Administered 2022-03-17: 1000 mL

## 2022-03-17 MED ORDER — OXYTOCIN-SODIUM CHLORIDE 30-0.9 UT/500ML-% IV SOLN
INTRAVENOUS | Status: DC | PRN
  Administered 2022-03-17: 400 mL via INTRAVENOUS

## 2022-03-17 MED ORDER — MEDROXYPROGESTERONE ACETATE 150 MG/ML IM SUSP: 150.0000 mg | INTRAMUSCULAR | Status: DC | PRN

## 2022-03-17 MED ORDER — FENTANYL CITRATE (PF) 100 MCG/2ML IJ SOLN
100.0000 ug | Freq: Once | INTRAMUSCULAR | Status: AC
  Administered 2022-03-17: 100 ug via INTRAVENOUS
  Filled 2022-03-17: qty 2

## 2022-03-17 MED ORDER — WITCH HAZEL-GLYCERIN EX PADS: 1.0000 | MEDICATED_PAD | CUTANEOUS | Status: DC | PRN

## 2022-03-17 SURGICAL SUPPLY — 38 items
APL SKNCLS STERI-STRIP NONHPOA (GAUZE/BANDAGES/DRESSINGS) ×1
BENZOIN TINCTURE PRP APPL 2/3 (GAUZE/BANDAGES/DRESSINGS) ×3 IMPLANT
CHLORAPREP W/TINT 26ML (MISCELLANEOUS) ×6 IMPLANT
CLAMP CORD UMBIL (MISCELLANEOUS) ×3 IMPLANT
CLOTH BEACON ORANGE TIMEOUT ST (SAFETY) ×3 IMPLANT
DRSG OPSITE POSTOP 4X10 (GAUZE/BANDAGES/DRESSINGS) ×3 IMPLANT
ELECT REM PT RETURN 9FT ADLT (ELECTROSURGICAL) ×2
ELECTRODE REM PT RTRN 9FT ADLT (ELECTROSURGICAL) ×2 IMPLANT
EXTRACTOR VACUUM M CUP 4 TUBE (SUCTIONS) IMPLANT
GLOVE BIOGEL PI IND STRL 7.0 (GLOVE) ×4 IMPLANT
GLOVE BIOGEL PI IND STRL 7.5 (GLOVE) ×4 IMPLANT
GLOVE BIOGEL PI INDICATOR 7.0 (GLOVE) ×2
GLOVE BIOGEL PI INDICATOR 7.5 (GLOVE) ×2
GLOVE ECLIPSE 7.5 STRL STRAW (GLOVE) ×3 IMPLANT
GOWN STRL REUS W/TWL LRG LVL3 (GOWN DISPOSABLE) ×9 IMPLANT
HEMOSTAT ARISTA ABSORB 3G PWDR (HEMOSTASIS) ×1 IMPLANT
KIT ABG SYR 3ML LUER SLIP (SYRINGE) IMPLANT
NDL HYPO 25X5/8 SAFETYGLIDE (NEEDLE) IMPLANT
NEEDLE HYPO 25X5/8 SAFETYGLIDE (NEEDLE) IMPLANT
NS IRRIG 1000ML POUR BTL (IV SOLUTION) ×3 IMPLANT
PACK C SECTION WH (CUSTOM PROCEDURE TRAY) ×3 IMPLANT
PAD ABD 8X10 STRL (GAUZE/BANDAGES/DRESSINGS) ×1 IMPLANT
PAD OB MATERNITY 4.3X12.25 (PERSONAL CARE ITEMS) ×3 IMPLANT
RTRCTR C-SECT PINK 25CM LRG (MISCELLANEOUS) ×3 IMPLANT
SPONGE GAUZE 4X4 STERILE 39 (GAUZE/BANDAGES/DRESSINGS) ×1 IMPLANT
STRIP CLOSURE SKIN 1/2X4 (GAUZE/BANDAGES/DRESSINGS) ×3 IMPLANT
SUT CHROMIC 1 CT1 27 (SUTURE) ×1 IMPLANT
SUT MNCRL 0 VIOLET CTX 36 (SUTURE) ×4 IMPLANT
SUT MON AB-0 CT1 36 (SUTURE) ×1 IMPLANT
SUT MONOCRYL 0 CTX 36 (SUTURE) ×4
SUT VIC AB 0 CTX 36 (SUTURE) ×2
SUT VIC AB 0 CTX36XBRD ANBCTRL (SUTURE) ×2 IMPLANT
SUT VIC AB 2-0 CT1 27 (SUTURE) ×4
SUT VIC AB 2-0 CT1 TAPERPNT 27 (SUTURE) ×2 IMPLANT
SUT VIC AB 4-0 KS 27 (SUTURE) ×3 IMPLANT
TOWEL OR 17X24 6PK STRL BLUE (TOWEL DISPOSABLE) ×3 IMPLANT
TRAY FOLEY W/BAG SLVR 14FR LF (SET/KITS/TRAYS/PACK) ×3 IMPLANT
WATER STERILE IRR 1000ML POUR (IV SOLUTION) ×3 IMPLANT

## 2022-03-17 NOTE — MAU Provider Note (Signed)
OB Note Came from office for labor eval. Pt 37/0 with h/o two prior c-sections. Patient 4-5cm per RN. I just checked her and she feels 5/80/-2 cephalic. BOW ruptured unsure when per patient but no e/o mec Fetus category II with baseline 150, no accels, no obvious decels, minimal variability, Toco q1-21m AF vs normal and stable Moderate distress with UCs SVE 5/80/-2/cephalic  Labs getting drawn and d/w pt recommend repeat c/s.she has been npo since yesterday with just some water this morning.  OR called and can go when labs back. Will give a dose of terbutaline and fentanyl in the interim.  Cornelia Copa MD Attending Center for [employer] (Faculty Practice) 03/17/2022 Time: 1230pm

## 2022-03-17 NOTE — Anesthesia Postprocedure Evaluation (Signed)
Anesthesia Post Note  Patient: Kara Fernandez  Procedure(s) Performed: REPEAT CESAREAN SECTION     Patient location during evaluation: PACU Anesthesia Type: Spinal Level of consciousness: oriented and awake and alert Pain management: pain level controlled Vital Signs Assessment: post-procedure vital signs reviewed and stable Respiratory status: spontaneous breathing, respiratory function stable and nonlabored ventilation Cardiovascular status: blood pressure returned to baseline and stable Postop Assessment: no headache, no backache, no apparent nausea or vomiting, spinal receding and patient able to bend at knees Anesthetic complications: no   No notable events documented.  Last Vitals:  Vitals:   03/17/22 1539 03/17/22 1545  BP: (!) 103/57 100/61  Pulse: (!) 116 (!) 115  Resp: (!) 25 15  Temp: (!) 38.3 C   SpO2: 96% 98%    Last Pain:  Vitals:   03/17/22 1545  TempSrc:   PainSc: 6    Pain Goal: Patients Stated Pain Goal: 0 (03/17/22 1222)              Epidural/Spinal Function Cutaneous sensation: Able to Discern Pressure (03/17/22 1600), Patient able to flex knees: Yes (03/17/22 1600), Patient able to lift hips off bed: No (03/17/22 1600), Back pain beyond tenderness at insertion site: No (03/17/22 1600), Progressively worsening motor and/or sensory loss: No (03/17/22 1600), Bowel and/or bladder incontinence post epidural: No (03/17/22 1545)  Khristen Cheyney A.

## 2022-03-17 NOTE — MAU Note (Signed)
Kara Fernandez is a 31 y.o. at [redacted]w[redacted]d here in MAU reporting: LOF 1 hour ago. States contractions got more painful at that point. Fluid is clear. No bleeding.  Onset of complaint: today  Pain score: 10/10  Vitals:   03/17/22 1218 03/17/22 1222  BP:  127/70  Pulse:  90  Resp:  (!) 22  Temp: 98.1 F (36.7 C)   SpO2:  96%     FHT:EFM applied in room  Lab orders placed from triage:

## 2022-03-17 NOTE — Anesthesia Procedure Notes (Signed)
Spinal  Patient location during procedure: OR Start time: 03/17/2022 1:28 PM End time: 03/17/2022 1:33 PM Reason for block: surgical anesthesia Staffing Performed: anesthesiologist  Anesthesiologist: Mal Amabile, MD Performed by: Mal Amabile, MD Authorized by: Mal Amabile, MD   Preanesthetic Checklist Completed: patient identified, IV checked, site marked, risks and benefits discussed, surgical consent, monitors and equipment checked, pre-op evaluation and timeout performed Spinal Block Patient position: sitting Prep: DuraPrep and site prepped and draped Patient monitoring: heart rate, cardiac monitor, continuous pulse ox and blood pressure Approach: midline Location: L3-4 Injection technique: single-shot Needle Needle type: Pencan  Needle gauge: 24 G Needle length: 9 cm Needle insertion depth: 6 cm Assessment Sensory level: T4 Events: CSF return Additional Notes Patient tolerated procedure well. Adequate sensory level.

## 2022-03-17 NOTE — Progress Notes (Signed)
Pharmacy Antibiotic Note  Kara Fernandez is a 31 y.o. female admitted on 03/17/2022 [redacted]w[redacted]d spontaneous labor, for repeat C/S. Pt developed fever (temp = 101) right after labor, s/p pre-op ancefand azithro, concern for chorio, plan for unasyn x 24 hrs post C/S. WBC 16.7K, GBS neg  Pharmacy has been consulted for Unasyn dosing.  Plan: Unasyn 3g IV Q 26 hrs x 4 doses F/u fever curve  Height: 5\' 1"  (154.9 cm) Weight: 79.2 kg (174 lb 11.2 oz) IBW/kg (Calculated) : 47.8  Temp (24hrs), Avg:99.6 F (37.6 C), Min:98.1 F (36.7 C), Max:101 F (38.3 C)  Recent Labs  Lab 03/17/22 1238  WBC 16.7*    CrCl cannot be calculated (Patient's most recent lab result is older than the maximum 21 days allowed.).    No Known Allergies  Antimicrobials this admission: Unasyn 7/24 >> (7/25) Ancef/azithro 7/24 x 1    Dose adjustments this admission:  Microbiology results: 7/24 RPR -   Thank you for allowing pharmacy to be a part of this patient's care.  8/24, PharmD, BCPS, BCPPS Clinical Pharmacist  Pager: [phone]  03/17/2022 3:53 PM

## 2022-03-17 NOTE — Discharge Summary (Signed)
Postpartum Discharge Summary  Date of Service updated***     Patient Name: Kara Fernandez DOB: [birth year] MRN: [id number]  Date of admission: 03/17/2022 Delivery date:03/17/2022  Delivering provider: Aletha Halim  Date of discharge: 03/19/2022  Admitting diagnosis: S/P emergency cesarean section [Z98.891],. 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 yo [id number] to the MAU on 03/17/2022 with contractions and rupture of membranes at home. She was found to be in active labor. The patient went for cesarean section due to  2 previous C-sections .  Delivery details as follows: Membrane Rupture Time/Date: 11:00 AM ,03/17/2022   Delivery Method:C-Section, Low Transverse  Details of operation can be found in separate operative note. She was continued on Unasyn postpartum as noted above due to concern for possible intra amniotic infection in the setting of foul smelling amniotic fluid at time of delivery.  She remained afebrile postpartum.  Her hemoglobin on POD#1 was 6.8, for which she received 1 unit of pRBCs.  Her CBC was rechecked on  POD#2 and her hemoglobin was 6.7, which which she received another 2 units of pRBCs.  Her post-transfusion hemoglobin was ***.  Her bleeding was stable after this time ***.  She is ambulating,tolerating a regular diet, passing flatus, and urinating well.  Her pain is controlled.  She is breastfeeding well***.  Patient is discharged home in stable condition 03/19/22.  Newborn Data: Birth date:03/17/2022  Birth time:2:11 PM  Gender:Female  Living status:Living  Apgars:6 ,10  Weight:2690 g   Magnesium Sulfate received: No BMZ received: No Rhophylac: N/A MMR: N/A T-DaP: Given prenatally Flu: Given prenatally  Transfusion: 3 units pRBCs total   Physical exam  Vitals:   03/19/22 1530 03/19/22 1711 03/19/22 2100 03/19/22 2125  BP: 120/74 120/80 113/68 124/77  Pulse: 90 78 75 77  Resp: '18 16 17 17  ' Temp: 98 F (36.7 C) 99 F (37.2 C) 97.9 F (36.6 C) 98.3 F (36.8 C)  TempSrc: Oral Oral Oral Oral  SpO2: 100% 100% 100% 100%  Weight:      Height:       General: {Exam; general:21111117} Lochia: {Desc; appropriate/inappropriate:30686::"appropriate"} Uterine Fundus: {Desc; firm/soft:30687} Incision: {Exam; incision:21111123} DVT Evaluation: {Exam; dvt:2111122}  Labs: Lab Results  Component Value Date   WBC 22.6 (H) 03/19/2022   HGB 6.7 (LL) 03/19/2022   HCT 21.0 (L) 03/19/2022   MCV 66.5 (L) 03/19/2022   PLT 275 03/19/2022      Latest  Ref Rng & Units 03/19/2022    2:41 PM  CMP  Creatinine 0.44 - 1.00 mg/dL 0.68    Edinburgh Score:    03/17/2022    5:15 PM  Edinburgh Postnatal Depression Scale Screening Tool  I have been able to laugh and see the funny side of things. 0  I have looked forward with enjoyment to things. 0  I have blamed myself unnecessarily when things went wrong. 2  I have been anxious or worried for no good reason. 0  I have felt scared or panicky for no good reason. 0  Things have been getting on top of me. 0  I have been so unhappy that I have had  difficulty sleeping. 0  I have felt sad or miserable. 0  I have been so unhappy that I have been crying. 0  The thought of harming myself has occurred to me. 0  Edinburgh Postnatal Depression Scale Total 2    After visit meds:  Allergies as of 03/19/2022   No Known Allergies   Med Rec must be completed prior to using this [facility]***       Discharge home in stable condition Infant Feeding: Breast Infant Disposition: {CHL IP OB HOME WITH LKJZPH:15056} Discharge instruction: per After Visit Summary and Postpartum booklet. Activity: Advance as tolerated. Pelvic rest for 6 weeks.  Diet: routine diet Future Appointments: Future Appointments  Date Time Provider [place]  03/24/2022 10:00 AM [facility] NURSE [facility]  04/25/2022  9:15 AM Donnamae Jude, MD [facility]   Follow up Visit: Please schedule this patient for a In person postpartum visit in 6 weeks with the following provider: MD. Additional Postpartum F/U: Incision check 1 week  High risk pregnancy complicated by:  2 previous C-sections,  IIH Delivery mode:  C-Section, Low Transverse  Anticipated Birth Control:  Depo  03/19/2022 Genia Del, MD

## 2022-03-17 NOTE — H&P (Signed)
Obstetrics Admission History & Physical  03/17/2022 - 12:59 PM Primary OBGYN: [facility]  Chief Complaint: early labor, h/o c-sections x 2  History of Present Illness  31 y.o. [id number] @ [redacted]w[redacted]d, with the above CC. Pregnancy complicated by: h/o c/s x 2, trich with neg toc, beta thal carrier, h/o pseudotumor, h/o hsv.  Ms. Kara Fernandez states that worsening UCs started this morning. Pt sent over from office. Pt checked in MAU and 4cm with q1-43m UCs and then I was called; pt ruptured today but unsure when.  Last food was before midnight and just had some water around 0800 today.   Review of Systems: as noted in the History of Present Illness.  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/24/2022   Heart murmur 09/19/2021   Supervision of other normal pregnancy, antepartum 08/12/2021   Idiopathic intracranial hypertension 10/15/2017   Status post repeat low transverse cesarean section--due to [facility] 02/27/2015   History of chlamydia 12/29/2011   History of gonorrhea 12/29/2011   History of asthma - childhood 12/29/2011   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    PMHx:  Past Medical History:  Diagnosis Date   Abnormal Pap smear    completed  at age 31   Angina ~2000   enlarged heart, pain due to stress   ASCUS on Pap smear    C&B 10/09 W/LGSIL   Asthma    as a child    Chlamydia infection    POSITIVE 10/06,8/07,7/09,11/11 ;at age 31   GC (gonococcus infection)    Gonorrhea    POSITIVE 8/06, 10/06; at age 31   H/O bacterial infection    frequently   H/O multiple allergies    H/O varicella    History of high blood pressure    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   Hypertension    not while rpegnant   Ovarian cyst    Ovarian cyst    Trichomonal vulvovaginitis    UTI (lower urinary  tract infection)    Vaginal Pap smear, abnormal    Yeast infection    PSHx:  Past Surgical History:  Procedure Laterality Date   CESAREAN SECTION  12/29/2011   Procedure: CESAREAN SECTION;  Surgeon: Purcell Nails, MD;  Location: [facility];  Service: Gynecology;  Laterality: N/A;   CESAREAN SECTION N/A 02/26/2015   Procedure: CESAREAN SECTION;  Surgeon: Osborn Coho, MD;  Location: [facility];  Service: Obstetrics;  Laterality: N/A;   DILATION AND CURETTAGE OF UTERUS     abortion   Medications:  Medications Prior to Admission  Medication Sig Dispense Refill Last Dose   acetaminophen (TYLENOL) 325 MG tablet Take 650 mg by mouth every 6 (six) hours as needed. (Patient not taking: Reported on 10/31/2021)      Blood Pressure Monitoring DEVI 1 each by Does not apply route once a week. (Patient not taking: Reported on 01/21/2022) 1 each 0    ondansetron (ZOFRAN-ODT) 8 MG disintegrating tablet Take 1 tablet (8 mg total) by mouth every 8 (eight) hours as needed for nausea or vomiting. (Patient not taking: Reported on 02/24/2022) 30 tablet 2      Allergies: has No Known Allergies. OBHx:  OB History  Gravida Para Term Preterm AB Living  4 2  2   1 2   SAB IAB Ectopic Multiple Live Births    1   0 2    # Outcome Date GA Lbr Len/2nd Weight Sex Delivery Anes PTL Lv  4 Current           3 Term 02/26/15 [redacted]w[redacted]d 10:05 / 00:42 3830 g F CS-LTranv EPI  LIV  2 Term 12/29/11 [redacted]w[redacted]d  3395 g M CS-LTranv EPI  LIV  1 IAB 2010 [redacted]w[redacted]d            Birth Comments: no complications    [place]       FHx:  Family History  Problem Relation Age of Onset   Hypertension Mother    Hypertension Father    Hypertension Maternal Grandmother    Stroke Maternal Grandmother    Hypertension Maternal Grandfather    Hypertension Maternal Aunt    Hypertension Maternal Uncle    Alcohol abuse Maternal Uncle    Alcohol abuse Paternal Uncle    Soc Hx:  Social History   Socioeconomic History   Marital status: Single    Spouse  name: Not on file   Number of children: Not on file   Years of education: Not on file   Highest education level: Not on file  Occupational History   Not on file  Tobacco Use   Smoking status: Former   Smokeless tobacco: Never  Vaping Use   Vaping Use: Former  Substance and Sexual Activity   Alcohol use: Not Currently   Drug use: Yes    Types: Marijuana    Comment: 06/2021   Sexual activity: Not Currently    Birth control/protection: None    Comment: pregnant, edd 04/07/2022  Other Topics Concern   Not on file  Social History Narrative   Not on file   Social Determinants of Health   Financial Resource Strain: Not on file  Food Insecurity: No Food Insecurity (03/10/2022)   Hunger Vital Sign    Worried About Running Out of Food in the Last Year: Never true    Ran Out of Food in the Last Year: Never true  Transportation Needs: No Transportation Needs (03/10/2022)   PRAPARE - 03/12/2022 (Medical): No    Lack of Transportation (Non-Medical): No  Physical Activity: Not on file  Stress: Not on file  Social Connections: Not on file  Intimate Partner Violence: Not on file    Objective    Current Vital Signs 24h Vital Sign Ranges  T 98.1 F (36.7 C) Temp  Avg: 98.1 F (36.7 C)  Min: 98.1 F (36.7 C)  Max: 98.1 F (36.7 C)  BP 127/70 BP  Min: 112/64  Max: 127/70  HR 90 Pulse  Avg: 94  Min: 90  Max: 98  RR (!) 22 Resp  Avg: 22  Min: 22  Max: 22  SaO2 96 % (room air)   SpO2  Avg: 96 %  Min: 96 %  Max: 96 %       24 Hour I/O Current Shift I/O  Time Ins Outs No intake/output data recorded. No intake/output data recorded.   EFM: 150 baseline, no accels, no decels, min variability  Toco: q1-3m  General: Well nourished, well developed female in no acute distress.  Skin:  Warm and dry.  Cardiovascular: S1, S2 normal, no murmur, rub or gallop, regular rate and rhythm Respiratory:  Clear to auscultation bilateral. Normal respiratory  effort Abdomen: gravid, nttp Neuro/Psych:  Normal mood and  affect.   SVE: 5/80/-2/cephalic, no mec. On my check  Labs  pending  Radiology No new imaging Anterior placenta  Assessment & Plan   31 y.o. [id number] @ [redacted]w[redacted]d with early labor, h/o c-section x 2 *Pregnancy: category II but baby stable *active labor: pt consented for repeat c-section once labs are back. Depo provera. Will do ancef and azithro *GBS: neg  Cornelia Copa MD Attending Center for [facility])

## 2022-03-17 NOTE — Transfer of Care (Signed)
Immediate Anesthesia Transfer of Care Note  Patient: Kara Fernandez  Procedure(s) Performed: REPEAT CESAREAN SECTION  Patient Location: PACU  Anesthesia Type:Spinal  Level of Consciousness: awake, alert  and oriented  Airway & Oxygen Therapy: Patient Spontanous Breathing  Post-op Assessment: Report given to RN and Post -op Vital signs reviewed and stable  Post vital signs: Reviewed and stable  Last Vitals:  Vitals Value Taken Time  BP 100/61 03/17/22 1545  Temp 38.3 C 03/17/22 1539  Pulse 113 03/17/22 1548  Resp 15 03/17/22 1543  SpO2 99 % 03/17/22 1548  Vitals shown include unvalidated device data.  Last Pain:  Vitals:   03/17/22 1539  TempSrc: Oral  PainSc: 2       Patients Stated Pain Goal: 0 (03/17/22 1222)  Complications: No notable events documented.

## 2022-03-17 NOTE — Anesthesia Preprocedure Evaluation (Signed)
Anesthesia Evaluation  Patient identified by MRN, date of birth, ID band Patient awake    Reviewed: Allergy & Precautions, NPO status , Patient's Chart, lab work & pertinent test results  Airway Mallampati: II  TM Distance: >3 FB Neck ROM: Full    Dental no notable dental hx. (+) Teeth Intact   Pulmonary asthma , former smoker,    Pulmonary exam normal breath sounds clear to auscultation       Cardiovascular hypertension, + angina Normal cardiovascular exam Rhythm:Regular Rate:Normal  Hx/o cHTN not on any Rx during pregnancy   Neuro/Psych negative neurological ROS  negative psych ROS   GI/Hepatic Neg liver ROS, GERD  ,  Endo/Other  Obesity  Renal/GU negative Renal ROS  negative genitourinary   Musculoskeletal negative musculoskeletal ROS (+)   Abdominal (+) + obese,   Peds  Hematology  (+) Blood dyscrasia, anemia , Beta thalassemia carrier   Anesthesia Other Findings   Reproductive/Obstetrics (+) Pregnancy Previous C/Section x 2 Multiple STD's- Chlamydia, Gonorrhea, HPV and HSV In active labor                             Anesthesia Physical Anesthesia Plan  ASA: 2  Anesthesia Plan: Combined Spinal and Epidural   Post-op Pain Management: Minimal or no pain anticipated and Regional block*   Induction:   PONV Risk Score and Plan: 4 or greater and Treatment may vary due to age or medical condition and Scopolamine patch - Pre-op  Airway Management Planned: Natural Airway  Additional Equipment:   Intra-op Plan:   Post-operative Plan:   Informed Consent: I have reviewed the patients History and Physical, chart, labs and discussed the procedure including the risks, benefits and alternatives for the proposed anesthesia with the patient or authorized representative who has indicated his/her understanding and acceptance.       Plan Discussed with: Anesthesiologist  Anesthesia  Plan Comments:         Anesthesia Quick Evaluation

## 2022-03-17 NOTE — Lactation Note (Addendum)
This note was copied from a baby's chart. Lactation Consultation Note  Patient Name: Kara Fernandez [id number]'F Date: 03/17/2022 Reason for consult: Initial assessment;Mother's request;Early term 37-38.6wks;Infant < 6lbs;Breastfeeding assistance;Other (Comment) (Zithromax Hale L2, Unasyn L1) Age:33 hours  LC reviewed LPTI guidelines. Mom feeding plan to EBF first 24 hrs. Mom like to start pumping in the am.   Plan 1. To feed based on cues 8-12x 24hr period.  2. Mom to offer breasts and look for signs of milk transfer. 3. Mom to hand express and offer colostrum on spoon 5 ml per feeding.   Mom to call for latch assistance with next feeding.   All questions answered at the end of the visit.  Mom aware if needs arises, DEBP will be provided to offer more volume via supplementation.    Maternal Data Has patient been taught Hand Expression?: Yes Does the patient have breastfeeding experience prior to this delivery?: Yes How long did the patient breastfeed?: 2 previous children for about 2 to 4 months  Feeding Mother's Current Feeding Choice: Breast Milk  LATCH Score Latch: Repeated attempts needed to sustain latch, nipple held in mouth throughout feeding, stimulation needed to elicit sucking reflex.  Audible Swallowing: Spontaneous and intermittent  Type of Nipple: Everted at rest and after stimulation  Comfort (Breast/Nipple): Soft / non-tender  Hold (Positioning): Assistance needed to correctly position infant at breast and maintain latch.  LATCH Score: 8   Lactation Tools Discussed/Used    Interventions Interventions: Breast feeding basics reviewed;Assisted with latch;Skin to skin;Breast massage;Hand express;Breast compression;Adjust position;Support pillows;Position options;Expressed milk;Education;LC Psychologist, educational;Infant Driven Feeding Algorithm education;LPT handout/interventions  Discharge WIC Program: Yes  Consult Status Consult Status: Follow-up Date:  03/18/22 Follow-up type: In-patient    Kara Jakubiak  Fernandez 03/17/2022, 6:14 PM

## 2022-03-17 NOTE — Progress Notes (Signed)
   PRENATAL VISIT NOTE  Subjective:  Kara Fernandez is a 31 y.o. [phone] at [redacted]w[redacted]d being seen today for ongoing prenatal care.  She is currently monitored for the following issues for this high-risk pregnancy and has Herpes simplex type 2 infection; History of chlamydia; History of gonorrhea; History of asthma - childhood; Status post repeat low transverse cesarean section--due to [facility]; Supervision of other normal pregnancy, antepartum; Heart murmur; Idiopathic intracranial hypertension; and Carrier of beta thalassemia on their problem list.  Patient reports occasional contractions and pelvic pressure.  Contractions: Irritability. Vag. Bleeding: None.  Movement: Present. Denies leaking of fluid.   The following portions of the patient's history were reviewed and updated as appropriate: allergies, current medications, past family history, past medical history, past social history, past surgical history and problem list.   Objective:   Vitals:   03/17/22 0927  BP: 112/64  Pulse: 98  Weight: 174 lb 11.2 oz (79.2 kg)    Fetal Status: Fetal Heart Rate (bpm): 145 Fundal Height: 38 cm Movement: Present  Presentation: Vertex  General:  Alert, oriented and cooperative. Patient is in no acute distress.  Skin: Skin is warm and dry. No rash noted.   Cardiovascular: Normal heart rate noted  Respiratory: Normal respiratory effort, no problems with respiration noted  Abdomen: Soft, gravid, appropriate for gestational age.  Pain/Pressure: Present     Pelvic: Cervical exam performed in the presence of a chaperone Dilation: 2 Effacement (%): 60 Station: -2  Extremities: Normal range of motion.  Edema: Trace  Mental Status: Normal mood and affect. Normal behavior. Normal judgment and thought content.   Assessment and Plan:  Pregnancy: [id number] at [redacted]w[redacted]d 1. Supervision of other normal pregnancy, antepartum - GBS negative   2. Status post repeat low transverse cesarean section--due to [facility] -  Scheduled for repeat 8/8  3. History of asthma - childhood - No recent issues   4. Herpes simplex type 2 infection - Taking Valtrex   5. 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 Idiopathic intracranial hypertension  7. [redacted] weeks gestation of pregnancy  Term labor symptoms and general obstetric precautions including but not limited to vaginal bleeding, contractions, leaking of fluid and fetal movement were reviewed in detail with the patient. Please refer to After Visit Summary for other counseling recommendations.   Return in about 1 week (around 03/24/2022) for [facility] APP, In-Person, as scheduled.  Future Appointments  Date Time Provider Department Center  03/24/2022  8:35 AM Adam Phenix, MD [facility]    Vonzella Nipple, PA-C

## 2022-03-17 NOTE — Op Note (Signed)
Operative Note   SURGERY DATE: 03/17/2022  PRE-OP DIAGNOSIS:  *Pregnancy at 37/1 *History of cesarean section x 2 and desire for repeat *Early labor  POST-OP DIAGNOSIS: Same. Pelvic adhesive disease. Chorioamnionitis    PROCEDURE: Repeat low transverse cesarean section via pfannenstiel skin incision with double layer uterine closure  SURGEON: Surgeon(s) and Role:    * Nolic Bing, MD - Primary  ASSISTANT:    * Ndulue, Nadene Rubins, MD - OB Fellow - Assisting  An experienced assistant was required given the standard of surgical care given the complexity of the case.  This assistant was needed for exposure, dissection, suctioning, retraction, instrument exchange,  assisting with delivery with administration of fundal pressure, and for overall help during the procedure.    ANESTHESIA: spinal  ESTIMATED BLOOD LOSS:   DRAINS: UOP via indwelling foley  TOTAL IV FLUIDS: per anesthesia note  VTE PROPHYLAXIS: SCDs to bilateral lower extremities  ANTIBIOTICS: Two grams of Cefazolin were given, within 1 hour of skin incision and Azithromycin 500mg  IV given in the OR. The patient will be placed on Unasyn x 24 hours post operatively  SPECIMENS: placenta to pathology  COMPLICATIONS: none  FINDINGS: mildly thick adhesion at the level of the bladder flap on the left side. Omental adhesions to the fundus. Grossly normal uterus, tubes and ovaries. Malodorous smelling amniotic fluid, cephalic,  female infant, weight 2690gm, APGARs 6/10, intact placenta.  PROCEDURE IN DETAIL: The patient was taken to the operating room where anesthesia was administered and normal fetal heart tones were confirmed. She was then prepped and draped in the normal fashion in the dorsal supine position with a leftward tilt.  After a time out was performed, a pfannensteil skin incision was made with the scalpel and carried through to the underlying layer of fascia. The fascia was then incised at the  midline and this incision was extended laterally with the mayo scissors. Attention was turned to the superior aspect of the fascial incision which was grasped with the kocher clamps x 2, tented up and the rectus muscles were dissected off with the scalpel. In a similar fashion the inferior aspect of the fascial incision was grasped with the kocher clamps, tented up and the rectus muscles dissected off with the mayo scissors. The rectus muscles were then separated in the midline and the peritoneum was entered bluntly. The bladder blade was inserted and the vesicouterine peritoneum was identified.  A low transverse hysterotomy was made with the scalpel until the endometrial cavity was breached and the placenta encountered, and the amniotic sac ruptured with the, yielding malodorous smelling amniotic fluid. This incision was extended bluntly and the infant's head, shoulders and body were delivered atraumatically.The cord was clamped x 2 and cut, and the infant was handed to the awaiting pediatricians, after delayed cord clamping was not done.  The placenta was then gradually expressed from the uterus and then the uterus was exteriorized and cleared of all clots and debris. The hysterotomy was repaired with a running suture of 1-0 monocryl. There was a slight midline extension that extended inferiorly below the hysterotomy that was repaired with #1 chormic. A second imbricating layer of 1-0 monocryl suture was then placed.   The uterus and adnexa were then returned to the abdomen, and the abdomen was then thoroughly irrigated. There was bleeding on the posterior aspect of the left rectus muscle that was stopped with 2-0 vicryl sutures.  The serosa around the hysterotomy was oozy so Arista was applied for  excellent hemostasis.  The peritoneum was closed with a running stitch of 3-0 Vicryl. The fascia was reapproximated with 0 Vicryl in a simple running fashion bilaterally. The subcutaneous layer was then  reapproximated with interrupted sutures of 2-0 plain gut, and the skin was then closed with 4-0 monocryl, in a subcuticular fashion.  The patient  tolerated the procedure well. Sponge, lap, needle, and instrument counts were correct x 2. The patient was transferred to the recovery room awake, alert and breathing independently in stable condition.  Cornelia Copa MD Attending Center for [facility])

## 2022-03-18 ENCOUNTER — Encounter (HOSPITAL_COMMUNITY): Payer: Self-pay | Admitting: Obstetrics and Gynecology

## 2022-03-18 LAB — CBC
HCT: 21.3 % — ABNORMAL LOW (ref 36.0–46.0)
Hemoglobin: 6.8 g/dL — CL (ref 12.0–15.0)
MCH: 21.3 pg — ABNORMAL LOW (ref 26.0–34.0)
MCHC: 31.9 g/dL (ref 30.0–36.0)
MCV: 66.8 fL — ABNORMAL LOW (ref 80.0–100.0)
Platelets: 293 10*3/uL (ref 150–400)
RBC: 3.19 MIL/uL — ABNORMAL LOW (ref 3.87–5.11)
RDW: 16.6 % — ABNORMAL HIGH (ref 11.5–15.5)
WBC: 29.7 10*3/uL — ABNORMAL HIGH (ref 4.0–10.5)
nRBC: 0.1 % (ref 0.0–0.2)

## 2022-03-18 LAB — PREPARE RBC (CROSSMATCH)

## 2022-03-18 MED ORDER — OXYCODONE HCL 5 MG PO TABS
5.0000 mg | ORAL_TABLET | ORAL | Status: DC | PRN
  Administered 2022-03-18 (×2): 5 mg via ORAL
  Administered 2022-03-19: 10 mg via ORAL
  Filled 2022-03-18: qty 1
  Filled 2022-03-18: qty 2
  Filled 2022-03-18: qty 1
  Filled 2022-03-18: qty 2

## 2022-03-18 MED ORDER — SODIUM CHLORIDE 0.9% IV SOLUTION: Freq: Once | INTRAVENOUS | Status: AC

## 2022-03-18 NOTE — Clinical Social Work Maternal (Signed)
CLINICAL SOCIAL WORK MATERNAL/CHILD NOTE  Patient Details  Name: CHANELLE HODSDON MRN: [id number] Date of Birth: 1990/11/01  Date:  03/18/2022  Clinical Social Worker Initiating Note:  Kathrin Greathouse, [place] Date/Time: Initiated:  03/18/22/      Child's Name:  Bettey Costa   Biological Parents:  Mother, Father (QMG:[id number] Blass 1988[birth year]29, FOB: Lucita Ferrara 04-12-1989)   Need for Interpreter:  None   Reason for Referral:  Current Substance Use/Substance Use During Pregnancy     Address:  [address] [place] [address]    Phone number:  [phone] (home)     Additional phone number:   Household Members/Support Persons (HM/SP):   Household Member/Support Person 1, Household Member/Support Person 2   HM/SP Name Relationship DOB or Age  HM/SP -41 Myrtis Hopping 4th Son 01-18-2012  HM/SP -2 Lisabeth Pick Daughter 02-26-2015  HM/SP -3        HM/SP -4        HM/SP -5        HM/SP -6        HM/SP -7        HM/SP -8          Natural Supports (not living in the home):  Extended Family, Parent, Spouse/significant other   Professional Supports: None   Employment: Full-time   Type of Work: Theme park manager   Education:  [place] arranged:    Museum/gallery curator Resources:      Other Resources:  Physicist, medical  , [facility]   Cultural/Religious Considerations Which May Impact Care:    Strengths:  Ability to meet basic needs  , Home prepared for child  , Pediatrician chosen   Psychotropic Medications:         Pediatrician:    [place] area  Pediatrician List:   [place] ([place])  [place]      Pediatrician Fax Number:    Risk Factors/Current Problems:  Substance Use     Cognitive State:  Able to Concentrate  , Insightful  , Alert  , Linear Thinking     Mood/Affect:  Calm  , Comfortable  , Interested     CSW Assessment: CSW received  consult for [facility] use. CSW met with MOB to offer support and complete assessment.    CSW met with MOB at bedside and introduced CSW role. CSW observed MOB holding the infant and supports at bedside. CSW offered MOB privacy. MOB supports left the room to allow privacy. MOB presented pleasant and receptive to [place] visit. MOB confirmed that the demographic information on hospital file is correct. MOB reported that she lives with her son "Lovey Newcomer", daughter "Mackey Birchwood" and FOB lives outside the home. She stated that he is involved. MOB reported that she is employed at [employer] and receives [employer].   CSW inquired about MOB substance use during the pregnancy. MOB reported that she smoked marijuana prior to learning about the pregnancy at 12 weeks and stopped when she found out.  MOB reported around five months she was at a party and unknowingly took only a sip of "liquid marijuana in the punch." MOB reported no other use. CSW informed MOB about the hospital drug screen policy. MOB made aware that CSW will monitor the infant's CDS/UDS and make a report to CPS, if warranted. MOB reported understanding and denied previous CPS history.  CSW inquired if MOB has mental health history. MOB acknowledged she a history of situational anxiety and depression after her grandmother passed away about a year and half ago. MOB reported going to therapy for two months and briefly took medication to treat symptoms but found relief with self-medicating with marijuana. MOB reported with the support of her mom and aunt she has chosen other ways to cope. MOB expressed, " I use to hold in my emotions." She talks and process her concerns with her mom and aunt which she feels is most effective. MOB reported when she has a stressful day, she also visits her grandmother grave site which helps calm her. CSW acknowledged MOB coping skills and encouraged her to continue using them. CSW assessed MOB for safety. MOB denied thoughts of harm to  self and others. CSW discussed PPD symptoms. CSW provided education regarding the baby blues period vs. perinatal mood disorders, discussed treatment and gave resources for mental health follow up if concerns arise. CSW recommended MOB complete self-evaluation during the postpartum time period using the New Mom Checklist from Postpartum Progress and encouraged MOB to contact a medical professional if symptoms are noted at any time.    MOB reported that she has all items for the infant including a bassinet where the infant will sleep. MOB has chosen [employer] [facility] for the infant's follow up and will have transportation to the appointments. MOB reported that she has already consented to receive services through [facility]. CSW assessed MOB for additional needs. MOB reported no further need.   CSW will monitor the infant's CDS/UDS and make a report to CPS, if warranted  CSW Plan/Description:  CSW Will Continue to Monitor Umbilical Cord Tissue Drug Screen Results and Make Report if [facility], [place], Sudden Infant Death Syndrome (SIDS) Education, Perinatal Mood and Anxiety Disorder (PMADs) Education, No Further Intervention Required/No Barriers to Discharge    Lia Hopping, LCSW 03/18/2022, 12:09 PM

## 2022-03-18 NOTE — Progress Notes (Addendum)
POSTPARTUM PROGRESS NOTE  Post Operative Day #1  Subjective:  Kara Fernandez is a 31 y.o. [id number] s/p rLTCS at [redacted]w[redacted]d.  No acute events overnight.  Pt reports that she has not yet voided independently as she still has her foley catheter in place. States that the RN informed her that her foley will be removed following her blood transfusion this AM. Denies problems with po intake.  She denies nausea or vomiting.  Pain is moderately controlled although worsened compared to yesterday given that her anesthesia has worn off more; using more PO pain medications at this time.  She has not had flatus. She has not had bowel movement.  Lochia mild, improving. Does also report some dizziness with standing. No reported fevers overnight.   Objective: Blood pressure 97/63, pulse 82, temperature 98.3 F (36.8 C), temperature source Oral, resp. rate 18, height 5\' 1"  (1.549 m), weight 79.2 kg, last menstrual period 07/01/2021, SpO2 100 %, unknown if currently breastfeeding.  Physical Exam:  General: alert, cooperative and no distress Chest: no respiratory distress Heart:regular rate, distal pulses intact Abdomen: soft, nontender,  Uterine Fundus: firm, appropriately tender DVT Evaluation: No calf swelling or tenderness Extremities: No peripheral edema Skin: warm, dry; wound dressing clean/dry/intact  Recent Labs    03/17/22 1238 03/18/22 0550  HGB 11.1* 6.8*  HCT 36.4 21.3*    Assessment/Plan: Kara Fernandez is a 30 y.o. [id number] s/p rLTCS at [redacted]w[redacted]d complicated by postoperative fever concerning for chorio as well as anemia.  POD#1 - Doing well, continue routine postpartum care. Foley to be removed today. Contraception: Depo provera Feeding: Breast Anemia: Hgb 6.8, ordered 1 u PRBCs, AM CBC pending  Postoperative fever, chorioamnionitis: AF overnight, completing 24 hrs Unasyn, AM CBC pending Dispo: Plan for discharge POD#3   LOS: 1 day   Raylene Everts, MD 03/18/2022, 8:00 AM   I  personally saw and evaluated the patient, performing the key elements of the service. I developed and verified the management plan that is described in the resident's/student's note, and I agree with the content with my edits above. VSS, HRR&R, Resp unlabored, Legs neg.  Cathie Beams, CNM 03/20/2022 9:45 AM

## 2022-03-19 ENCOUNTER — Inpatient Hospital Stay (HOSPITAL_COMMUNITY): Payer: Medicaid Other

## 2022-03-19 DIAGNOSIS — Z98891 History of uterine scar from previous surgery: Secondary | ICD-10-CM

## 2022-03-19 DIAGNOSIS — D62 Acute posthemorrhagic anemia: Secondary | ICD-10-CM | POA: Diagnosis not present

## 2022-03-19 LAB — CREATININE, SERUM
Creatinine, Ser: 0.68 mg/dL (ref 0.44–1.00)
GFR, Estimated: >60 mL/min (ref >60)

## 2022-03-19 LAB — CBC
HCT: 21 % — ABNORMAL LOW (ref 36.0–46.0)
Hemoglobin: 6.7 g/dL — CL (ref 12.0–15.0)
MCH: 21.2 pg — ABNORMAL LOW (ref 26.0–34.0)
MCHC: 31.9 g/dL (ref 30.0–36.0)
MCV: 66.5 fL — ABNORMAL LOW (ref 80.0–100.0)
Platelets: 275 10*3/uL (ref 150–400)
RBC: 3.16 MIL/uL — ABNORMAL LOW (ref 3.87–5.11)
RDW: 16.8 % — ABNORMAL HIGH (ref 11.5–15.5)
WBC: 22.6 10*3/uL — ABNORMAL HIGH (ref 4.0–10.5)
nRBC: 0.2 % (ref 0.0–0.2)

## 2022-03-19 LAB — PREPARE RBC (CROSSMATCH)

## 2022-03-19 LAB — SURGICAL PATHOLOGY

## 2022-03-19 MED ORDER — IOHEXOL 9 MG/ML PO SOLN
ORAL | Status: AC
  Administered 2022-03-19: 500 mL via ORAL
  Filled 2022-03-19: qty 1000

## 2022-03-19 MED ORDER — IOHEXOL 300 MG/ML  SOLN
100.0000 mL | Freq: Once | INTRAMUSCULAR | Status: AC | PRN
  Administered 2022-03-19: 100 mL via INTRAVENOUS

## 2022-03-19 MED ORDER — SODIUM CHLORIDE 0.9% IV SOLUTION
Freq: Once | INTRAVENOUS | Status: AC
Start: 2022-03-19 — End: 2022-03-19

## 2022-03-19 MED ORDER — IOHEXOL 9 MG/ML PO SOLN
500.0000 mL | ORAL | Status: AC
  Administered 2022-03-19: 500 mL via ORAL

## 2022-03-19 NOTE — Lactation Note (Signed)
This note was copied from a baby's chart. Lactation Consultation Note  Patient Name: Kara Fernandez [id number] Date: 03/19/2022 Reason for consult: Follow-up assessment;Early term 37-38.6wks;Infant < 6lbs Age:33 hours   P3 - Early term infant at 37+0 weeks Feeding preference - Breast  RN in room assisting family when I arrived.  Offered to assist with latching; birth parent receptive.  Assisted to latch easily and, with gentle stimulation, baby began to eagerly suck.  Observed her feeding for 20 minutes while reviewing breast feeding basics.  Audible swallows noted during feeding and birth parent felt strong uterine contractions. Birth parent reported that this infant will not latch to the right breast, however, neither of her other children latched on the right breast either.  Baby placed STS on support person's chest after feeding where she fell asleep.  Offered to initiate the electric breast pump for stimulation and supplementation.  Birth parent interested.  Reviewed pump set up, pump parts and cleaning.  Observed pumping and provided #21 flanges for better fit and comfort.  Taught mother how to assess for correct flange size with each pumping session.  Birth parent will finger feed/spoon feed any expressed drops to baby.  WIC referral faxed yesterday; WIC representative has called birth parent but did not advise as to eligibility of pump pick up.  Suggested birth parent return phone call today so she can have a pump at discharge.  Discussed back up plan of a [facility] loaner if necessary.  Parents with no further questions/concerns at this time.  RN updated.   Maternal Data Has patient been taught Hand Expression?: Yes Does the patient have breastfeeding experience prior to this delivery?: Yes How long did the patient breastfeed?: 2-3 months with her first 2 children  Feeding Mother's Current Feeding Choice: Breast Milk  LATCH Score Latch: Grasps breast easily, tongue down, lips  flanged, rhythmical sucking.  Audible Swallowing: Spontaneous and intermittent  Type of Nipple: Everted at rest and after stimulation  Comfort (Breast/Nipple): Soft / non-tender  Hold (Positioning): Assistance needed to correctly position infant at breast and maintain latch.  LATCH Score: 9   Lactation Tools Discussed/Used Tools: Pump;Flanges Flange Size: 21 Breast pump type: Double-Electric Breast Pump;Manual Pump Education: Setup, frequency, and cleaning;Milk Storage Reason for Pumping: Breast stimulation for supplementation for early term infant Pumping frequency: Every three hours  Interventions Interventions: Breast feeding basics reviewed;Assisted with latch;Skin to skin;Breast massage;Hand express;Breast compression;Hand pump;Expressed milk;Position options;Support pillows;Adjust position;DEBP;Education  Discharge Pump: DEBP;Manual [facility] representative contacted birth parent but no plans made for pump pick up; suggested birth parent follow through today) WIC Program: Yes  Consult Status Consult Status: Follow-up Date: 03/20/22 Follow-up type: In-patient    Zakya Halabi R Wanisha Shiroma 03/19/2022, 1:41 PM

## 2022-03-19 NOTE — Lactation Note (Signed)
This note was copied from a baby's chart. Lactation Consultation Note  Patient Name: Girl Kysa Calais [id number] Date: 03/19/2022   Age:33 hours Omnipaque Listed L2 hale book as told to birth parent.   Maternal Data    Feeding    LATCH Score                    Lactation Tools Discussed/Used    Interventions    Discharge    Consult Status      Marlin Brys  [name] 03/19/2022, 9:12 PM

## 2022-03-19 NOTE — Progress Notes (Signed)
POSTPARTUM PROGRESS NOTE  POD #2  Subjective:  Kara Fernandez is a 31 y.o. [id number] s/p rLTCS at [redacted]w[redacted]d. No acute events overnight. She reports she is doing well but feels very fatigued. She denies any problems with ambulating, voiding or po intake. Denies nausea or vomiting. She has passed flatus and normal BM. Pain is moderately controlled, feeling generally sore.  Lochia is moderate-- reports some clots overnight but has lightened up today.  Objective: Blood pressure 113/70, pulse 92, temperature 98.5 F (36.9 C), temperature source Oral, resp. rate 18, height 5\' 1"  (1.549 m), weight 79.2 kg, last menstrual period 07/01/2021, SpO2 100 %, unknown if currently breastfeeding.  Physical Exam:  General: alert, cooperative and no distress Chest: no respiratory distress Heart: regular rate, distal pulses intact Abdomen: Soft, non-distended  Uterine Fundus: firm, appropriately tender DVT Evaluation: No calf swelling or tenderness Extremities: minimal edema Skin: warm, dry; incision covered with honeycomb/pressure dressing in place   Recent Labs    03/18/22 0550 03/19/22 0422  HGB 6.8* 6.7*  HCT 21.3* 21.0*    Assessment/Plan: Kara Fernandez is a 31 y.o. 31 s/p rLTCS at [redacted]w[redacted]d.  POD#2 - Pain moderately controlled.   Routine postpartum care  OOB, ambulated  Lovenox for VTE prophylaxis  Acute Blood loss Anemia: Associated with + fatigue.  S/p 1 U pRBCs on 7/25, Hgb 6.8>6.7 today w/ vaginal clots overnight. Plan for additional 2 U, consented patient at bedside. Reassured that bleeding has improved today. Abdomen is benign.   Contraception: Depo  Feeding: Breast  Dispo: Plan for discharge tomorrow.   LOS: 2 days   8/25, DO OB Fellow  03/19/2022, 12:46 PM

## 2022-03-20 ENCOUNTER — Other Ambulatory Visit (HOSPITAL_COMMUNITY): Payer: Self-pay

## 2022-03-20 LAB — TYPE AND SCREEN
ABO/RH(D): B POS
Antibody Screen: NEGATIVE
Unit division: 0
Unit division: 0
Unit division: 0

## 2022-03-20 LAB — BPAM RBC
Blood Product Expiration Date: 202308112359
Blood Product Expiration Date: 202308112359
Blood Product Expiration Date: 202308152359
ISSUE DATE / TIME: 202307250921
ISSUE DATE / TIME: 202307261459
ISSUE DATE / TIME: 202307262052
Unit Type and Rh: 7300
Unit Type and Rh: 7300
Unit Type and Rh: 7300

## 2022-03-20 LAB — CBC
HCT: 28.6 % — ABNORMAL LOW (ref 36.0–46.0)
Hemoglobin: 9.6 g/dL — ABNORMAL LOW (ref 12.0–15.0)
MCH: 23.1 pg — ABNORMAL LOW (ref 26.0–34.0)
MCHC: 33.6 g/dL (ref 30.0–36.0)
MCV: 68.8 fL — ABNORMAL LOW (ref 80.0–100.0)
Platelets: 333 10*3/uL (ref 150–400)
RBC: 4.16 MIL/uL (ref 3.87–5.11)
RDW: 19.9 % — ABNORMAL HIGH (ref 11.5–15.5)
WBC: 17.9 10*3/uL — ABNORMAL HIGH (ref 4.0–10.5)
nRBC: 0.3 % — ABNORMAL HIGH (ref 0.0–0.2)

## 2022-03-20 MED ORDER — IBUPROFEN 600 MG PO TABS
600.0000 mg | ORAL_TABLET | Freq: Four times a day (QID) | ORAL | 0 refills | Status: DC | PRN
  Filled 2022-03-20: qty 40, 10d supply, fill #0

## 2022-03-20 MED ORDER — OXYCODONE HCL 5 MG PO TABS
5.0000 mg | ORAL_TABLET | ORAL | 0 refills | Status: DC | PRN
  Filled 2022-03-20: qty 30, 5d supply, fill #0

## 2022-03-20 MED ORDER — FERROUS SULFATE 325 (65 FE) MG PO TABS
325.0000 mg | ORAL_TABLET | ORAL | 0 refills | Status: DC
  Filled 2022-03-20: qty 30, 60d supply, fill #0

## 2022-03-20 MED ORDER — ACETAMINOPHEN 500 MG PO TABS
1000.0000 mg | ORAL_TABLET | Freq: Three times a day (TID) | ORAL | 0 refills | Status: DC | PRN
  Filled 2022-03-20: qty 30, 5d supply, fill #0

## 2022-03-21 ENCOUNTER — Telehealth: Payer: Self-pay | Admitting: Family Medicine

## 2022-03-21 NOTE — Telephone Encounter (Signed)
Patient delivered early and said her Job don't have paper work to be completed for her "Leave"  is there a way to get this without having paperwork,  something when she delivered and how long she will be out of work .  Can you please contact  the patient [phone]

## 2022-03-24 ENCOUNTER — Ambulatory Visit (INDEPENDENT_AMBULATORY_CARE_PROVIDER_SITE_OTHER): Payer: Self-pay | Admitting: General Practice

## 2022-03-24 ENCOUNTER — Encounter: Payer: Medicaid Other | Admitting: Obstetrics & Gynecology

## 2022-03-24 VITALS — BP 143/80 | HR 77 | Ht 61.0 in | Wt 174.0 lb

## 2022-03-24 DIAGNOSIS — Z5189 Encounter for other specified aftercare: Secondary | ICD-10-CM

## 2022-03-24 DIAGNOSIS — O165 Unspecified maternal hypertension, complicating the puerperium: Secondary | ICD-10-CM

## 2022-03-24 MED ORDER — NIFEDIPINE ER OSMOTIC RELEASE 30 MG PO TB24: 30.0000 mg | ORAL_TABLET | Freq: Every day | ORAL | 1 refills | Status: AC

## 2022-03-24 NOTE — Progress Notes (Signed)
Patient presents to office today for wound check following repeat c-section on 7/24. Patient denies headaches or dizziness but has occasional blurry vision. + 1 edema noted. BP 144/85 & 143/80, patient does report elevated BP readings at home. Discussed with Dr Alysia Penna who recommends patient begin procardia 30mg  XL and return in 2 weeks for repeat BP check. Discussed with patient who verbalized understanding. Honeycomb dressing removed from incision. Incision appears to be healing well and is intact. Wound care and signs & symptoms of infection reviewed with patient. Patient will return in 2 weeks for repeat BP check.  RN BSN 03/24/22

## 2022-03-31 ENCOUNTER — Encounter: Payer: Medicaid Other | Admitting: Student

## 2022-04-01 ENCOUNTER — Encounter: Payer: Medicaid Other | Admitting: Family Medicine

## 2022-04-01 ENCOUNTER — Inpatient Hospital Stay (HOSPITAL_COMMUNITY): Admit: 2022-04-01 | Payer: Medicaid Other | Admitting: Family Medicine

## 2022-04-02 ENCOUNTER — Encounter: Payer: Medicaid Other | Admitting: Obstetrics and Gynecology

## 2022-04-03 ENCOUNTER — Encounter: Payer: Self-pay | Admitting: General Practice

## 2022-04-07 ENCOUNTER — Encounter: Payer: Medicaid Other | Admitting: Student

## 2022-04-07 ENCOUNTER — Ambulatory Visit: Payer: Medicaid Other

## 2022-04-09 ENCOUNTER — Other Ambulatory Visit: Payer: Medicaid Other

## 2022-04-09 ENCOUNTER — Encounter: Payer: Medicaid Other | Admitting: Obstetrics and Gynecology

## 2022-04-14 ENCOUNTER — Encounter: Payer: Medicaid Other | Admitting: Advanced Practice Midwife

## 2022-04-14 ENCOUNTER — Other Ambulatory Visit: Payer: Medicaid Other

## 2022-04-25 ENCOUNTER — Ambulatory Visit: Payer: Medicaid Other | Admitting: Family Medicine

## 2022-04-29 ENCOUNTER — Other Ambulatory Visit (HOSPITAL_COMMUNITY)
Admission: RE | Admit: 2022-04-29 | Discharge: 2022-04-29 | Disposition: A | Discharge status: Home / Self Care | Payer: Medicaid Other | Source: Ambulatory Visit | Attending: Family Medicine | Admitting: Family Medicine

## 2022-04-29 ENCOUNTER — Ambulatory Visit (INDEPENDENT_AMBULATORY_CARE_PROVIDER_SITE_OTHER): Payer: Medicaid Other | Admitting: Family Medicine

## 2022-04-29 ENCOUNTER — Encounter: Payer: Self-pay | Admitting: Family Medicine

## 2022-04-29 ENCOUNTER — Ambulatory Visit: Payer: Medicaid Other | Admitting: Family Medicine

## 2022-04-29 DIAGNOSIS — Z3042 Encounter for surveillance of injectable contraceptive: Secondary | ICD-10-CM | POA: Diagnosis not present

## 2022-04-29 DIAGNOSIS — Z3202 Encounter for pregnancy test, result negative: Secondary | ICD-10-CM | POA: Diagnosis not present

## 2022-04-29 LAB — POCT PREGNANCY, URINE: Preg Test, Ur: NEGATIVE

## 2022-04-29 MED ORDER — MEDROXYPROGESTERONE ACETATE 150 MG/ML IM SUSP
150.0000 mg | Freq: Once | INTRAMUSCULAR | Status: AC
  Administered 2022-04-29: 150 mg via INTRAMUSCULAR

## 2022-04-29 NOTE — Progress Notes (Signed)
Pt wants Depo for Birth Control.

## 2022-04-29 NOTE — Progress Notes (Signed)
Post Partum Visit Note  Kara Fernandez is a 31 y.o. [phone] female who presents for a postpartum visit. She is 6 weeks postpartum following a repeat cesarean section.  I have fully reviewed the prenatal and intrapartum course. The delivery was at 37/1 gestational weeks.  Anesthesia: spinal. Postpartum course has been good, she is attempting to sleep and has some help from her mother. Baby is doing well. Baby is feeding by bottle - [employer] Good Start. Bleeding no bleeding. Bowel function is normal. Bladder function is normal. Patient is not sexually active. Contraception method is none. Postpartum depression screening: negative. No longer taking antihypertensives. Denies hx with BP prior to pregnancy. She admits to salting her food.    The pregnancy intention screening data noted above was reviewed. Potential methods of contraception were discussed. The patient elected to proceed with No data recorded.    Health Maintenance Due  Topic Date Due   COVID-19 Vaccine (1) Never done   HPV VACCINES (2 - 3-dose series) 07/22/2007   PAP SMEAR-Modifier  05/19/2015   INFLUENZA VACCINE  03/25/2022    Review of Systems Pertinent items are noted in HPI.  Objective:  BP 132/79   Pulse 72   Ht 5\' 1"  (1.549 m)   Wt 72.9 kg   LMP 07/01/2021   Breastfeeding No   BMI 30.38 kg/m    General: Appears well, no acute distress. Age appropriate. Cardiac: regular heart rate noted Respiratory: normal effort Extremities: No edema or cyanosis. Skin: Incision appears to be completely healed Neuro: alert and oriented Psych: normal affect      Assessment:  1. Encounter for surveillance of injectable contraceptive - medroxyPROGESTERone (DEPO-PROVERA) injection 150 mg  2. Postpartum care and examination Doing well no concerns today. Hx of rLTCS w/ Acute blood loss anemia requiring 3 units total of pRBCs. There was also a concern for possible intra amniotic infection, due to foul smelling amniotic  fluid at time of delivery (treated with Unasyn x 24 hours postpartum).  - Cytology - PAP - Discussed weight loss and decreasing salt in diet to improve BP   Plan:   Essential components of care per ACOG recommendations:  1.  Mood and well being: Patient with negative depression screening today. Reviewed local resources for support.  - Patient tobacco use? No.   - hx of drug use? No.    2. Infant care and feeding:  -Patient currently breastmilk feeding? No.  -Social determinants of health (SDOH) reviewed in EPIC. No concerns  3. Sexuality, contraception and birth spacing - Patient does want a pregnancy in the next year.  Desired family size is 4 children.  - Reviewed reproductive life planning. Reviewed contraceptive methods based on pt preferences and effectiveness.  Patient desired Hormonal Injection today.   - Discussed birth spacing of 18 months  4. Sleep and fatigue -Encouraged family/partner/community support of 4 hrs of uninterrupted sleep to help with mood and fatigue  5. Physical Recovery  - Discussed patients delivery and complications. She describes her labor as good. - Patient had a C-section with PPH and concern for chorioamnitis  . Patient had a low transverse incision which has healed. Patient expressed understanding - Patient has urinary incontinence? No. - Patient is safe to resume physical and sexual activity  6.  Health Maintenance - HM due items addressed Yes - Last pap smear No results found for: "DIAGPAP" Pap smear done at today's visit.  -Breast Cancer screening indicated? No.   7. Chronic Disease/Pregnancy  Condition follow up: Hypertension PP, no longer taking procardia. Discussed lifestyle changes as above.   - PCP follow up  Lavonda Jumbo, DO OB Fellow, Faculty Practice [facility] 04/29/2022, 5:12 PM

## 2022-04-29 NOTE — Progress Notes (Signed)
[name] here for Depo-Provera  Injection.  Injection administered without complication. Patient will return in 3 months for next injection.  Henrietta Dine, CMA 04/29/2022  4:27 PM

## 2022-05-07 LAB — CYTOLOGY - PAP
Comment: NEGATIVE
Diagnosis: NEGATIVE
High risk HPV: NEGATIVE

## 2022-05-30 NOTE — Progress Notes (Signed)
Chart reviewed for nurse visit. Agree with plan of care.   Starr Lake, CNM 05/30/2022 1:47 PM

## 2022-09-08 ENCOUNTER — Ambulatory Visit (INDEPENDENT_AMBULATORY_CARE_PROVIDER_SITE_OTHER): Payer: Medicaid Other

## 2022-09-08 ENCOUNTER — Other Ambulatory Visit: Payer: Self-pay

## 2022-09-08 VITALS — BP 116/73 | HR 68 | Ht 61.0 in | Wt 179.8 lb

## 2022-09-08 DIAGNOSIS — Z3202 Encounter for pregnancy test, result negative: Secondary | ICD-10-CM | POA: Diagnosis not present

## 2022-09-08 DIAGNOSIS — Z3042 Encounter for surveillance of injectable contraceptive: Secondary | ICD-10-CM | POA: Diagnosis not present

## 2022-09-08 LAB — POCT PREGNANCY, URINE: Preg Test, Ur: NEGATIVE

## 2022-09-08 MED ORDER — MEDROXYPROGESTERONE ACETATE 150 MG/ML IM SUSP
150.0000 mg | Freq: Once | INTRAMUSCULAR | Status: AC
  Administered 2022-09-08: 150 mg via INTRAMUSCULAR

## 2022-09-08 NOTE — Progress Notes (Signed)
Kara Fernandez here for Depo-Provera Injection. Patient is [redacted]w[redacted]d since last depo injection on 04/29/22.  Patient denies unprotected intercourse x3 weeks. Per protocol, UPT obtained and was negative. Injection administered without complication. Patient will return in 3 months for next injection between 11/25/22 and 12/09/22. Next annual visit due 04/30/23.   Alinda Money, RN 09/08/2022  3:18 PM

## 2022-11-12 ENCOUNTER — Ambulatory Visit (HOSPITAL_COMMUNITY)
Admission: EM | Admit: 2022-11-12 | Discharge: 2022-11-12 | Disposition: A | Discharge status: Home / Self Care | Payer: Medicaid Other

## 2022-11-12 ENCOUNTER — Other Ambulatory Visit: Payer: Self-pay

## 2022-11-12 DIAGNOSIS — T7840XA Allergy, unspecified, initial encounter: Secondary | ICD-10-CM

## 2022-11-12 DIAGNOSIS — R220 Localized swelling, mass and lump, head: Secondary | ICD-10-CM

## 2022-11-12 DIAGNOSIS — R682 Dry mouth, unspecified: Secondary | ICD-10-CM | POA: Diagnosis not present

## 2022-11-12 LAB — CBG MONITORING, ED: Glucose-Capillary: 97 mg/dL (ref 70–99)

## 2022-11-12 MED ORDER — DEXAMETHASONE SODIUM PHOSPHATE 10 MG/ML IJ SOLN
INTRAMUSCULAR | Status: AC
  Filled 2022-11-12: qty 1

## 2022-11-12 MED ORDER — DEXAMETHASONE SODIUM PHOSPHATE 10 MG/ML IJ SOLN
10.0000 mg | Freq: Once | INTRAMUSCULAR | Status: AC
Start: 2022-11-12 — End: 2022-11-12
  Administered 2022-11-12: 10 mg via INTRAMUSCULAR

## 2022-11-12 NOTE — ED Provider Notes (Signed)
[place]    CSN: [id number] Arrival date & time: 11/12/22  1441      History   Chief Complaint No chief complaint on file.   HPI Kara Fernandez is a 32 y.o. female.   Patient presents to urgent care for evaluation of tongue swelling and sensation of neck swelling that started a couple of hours ago after she ate [place] (country ham biscuit and sweet tea).  She denies sensation of throat swelling or throat closure.  States her tongue is very swollen and she noticed this when eating.  Denies difficulty swallowing, shortness of breath, sore throat, fever/chills, chest pain, rash, hives, and dizziness.  She states that she has had multiple allergic reactions in the past with unknown trigger and used to get allergy shots 3 times weekly with [facility].  She has not been back to her allergist in a couple of years but has been meaning to call to schedule an appointment due to more frequent allergy symptoms. She states usually experiences hives with her allergic reactions when they happen and she manages them with Benadryl at home without difficulty.  She states after eating the country ham biscuit today, this is the first allergic reaction she has had where she has had tongue swelling.  She denies history of anaphylactic reaction/need for EpiPen.  She has not had any Benadryl before coming to urgent care.  She states she did take ibuprofen 600 mg before coming to urgent care to help with inflammation. Patient also complains of polydipsia and dry mouth.  She states she used to drink a lot of soda and has quit recently.  States her dad has diabetes and is concerned for same.  Recently gave birth to baby 6 months ago and states she was borderline gestational diabetic.  She does not take any glucose lowering medications however does take Lasix for hypertension and cardiomegaly.  Has not missed any doses of Lasix.  Denies recent changes in diet or exercise.  Denies urinary  symptoms, vaginal symptoms, and abdominal pain/nausea/vomiting.     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   Hypertension    not while rpegnant   Ovarian cyst    Ovarian cyst    Status post repeat low transverse cesarean section--due to [facility] 02/27/2015   Trichomonal vulvovaginitis    UTI (lower urinary tract infection)    Vaginal Pap smear, abnormal    Yeast infection     Patient Active Problem List   Diagnosis Date Noted   Acute blood loss anemia 03/19/2022   Hx of cesarean section 03/19/2022   Status post repeat low transverse cesarean section 03/17/2022   Full-term  premature rupture of membranes (PROM) with unknown onset of labor 03/17/2022   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/24/2022   Heart murmur 09/19/2021   Supervision of other normal pregnancy, antepartum 08/12/2021   Idiopathic intracranial hypertension 10/15/2017   History of asthma - childhood 12/29/2011   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    Past Surgical History:  Procedure Laterality Date   CESAREAN SECTION  12/29/2011   Procedure: CESAREAN SECTION;  Surgeon: Delice Lesch, MD;  Location: [facility];  Service: Gynecology;  Laterality: N/A;   CESAREAN SECTION N/A 02/26/2015   Procedure: CESAREAN SECTION;  Surgeon: Everett Graff, MD;  Location: [facility];  Service: Obstetrics;  Laterality: N/A;   CESAREAN SECTION  N/A 03/17/2022   Procedure: REPEAT CESAREAN SECTION;  Surgeon: Aletha Halim, MD;  Location: [facility];  Service: Obstetrics;  Laterality: N/A;   DILATION AND CURETTAGE OF UTERUS     abortion    OB History     Gravida  4   Para  3   Term  3   Preterm      AB  1   Living  3      SAB      IAB  1   Ectopic      Multiple  0   Live Births  3            Home Medications    Prior to Admission medications   Medication Sig Start Date End Date Taking? Authorizing Provider  furosemide (LASIX) 40 MG tablet Take 40 mg by mouth.   Yes [provider]  acetaminophen (TYLENOL) 500 MG tablet Take 2 tablets (1,000 mg total) by mouth every 8 (eight) hours as needed (pain). 03/20/22   Genia Del, MD  BIOTIN PO Take by mouth.    [provider]  Blood Pressure Monitoring DEVI 1 each by Does not apply route once a week. Patient not taking: Reported on 09/08/2022 09/10/21   Starr Lake, CNM  ferrous sulfate 325 (65 FE) MG tablet Take 1 tablet (325 mg total) by mouth every other day. Patient not taking: Reported on 03/24/2022 03/20/22   Genia Del, MD  ibuprofen (ADVIL) 600 MG tablet Take 1 tablet (600 mg total) by mouth every 6 (six) hours as needed (pain). Patient not taking: Reported on 09/08/2022 03/20/22   Genia Del, MD  NIFEdipine (PROCARDIA XL) 30 MG 24 hr tablet Take 1 tablet (30 mg total) by mouth daily. Patient not taking: Reported on 04/29/2022 03/24/22   Chancy Milroy, MD  oxyCODONE (OXY IR/ROXICODONE) 5 MG immediate release tablet Take 1 tablet (5 mg total) by mouth every 4 (four) hours as needed for breakthrough pain or severe pain. Patient not taking: Reported on 04/29/2022 03/20/22   Genia Del, MD  valACYclovir (VALTREX) 500 MG tablet Take 500 mg by mouth daily as needed (cold sores). Patient not taking: Reported on 03/24/2022    [provider]  potassium chloride (K-DUR) 10 MEQ tablet Take 1  tablet (10 mEq total) by mouth daily. 10/22/18 12/26/19  Isla Pence, MD    Family History Family History  Problem Relation Age of Onset   Hypertension Mother    Hypertension Father    Hypertension Maternal Grandmother    Stroke Maternal Grandmother    Hypertension Maternal Grandfather    Hypertension Maternal Aunt    Hypertension Maternal Uncle    Alcohol abuse Maternal Uncle    Alcohol abuse Paternal Uncle  Social History Social History   Tobacco Use   Smoking status: Former   Smokeless tobacco: Never  Scientific laboratory technician Use: Former  Substance Use Topics   Alcohol use: Not Currently   Drug use: Yes    Frequency: 14.0 times per week    Types: Marijuana    Comment: once in the morning and once at night per day     Allergies   Patient has no known allergies.   Review of Systems Review of Systems Per HPI  Physical Exam Triage Vital Signs ED Triage Vitals  Enc Vitals Group     BP 11/12/22 1446 110/76     Pulse Rate 11/12/22 1446 91     Resp 11/12/22 1446 20     Temp 11/12/22 1446 99.3 F (37.4 C)     Temp Source 11/12/22 1446 Oral     SpO2 11/12/22 1446 100 %     Weight --      Height --      Head Circumference --      Peak Flow --      Pain Score 11/12/22 1450 0     Pain Loc --      Pain Edu? --      Excl. in [place]? --    No data found.  Updated Vital Signs BP 110/76 (BP Location: Right Arm) Comment (BP Location): large cuff  Pulse 91   Temp 99.1 F (37.3 C) (Oral)   Resp 20   SpO2 100%   Visual Acuity Right Eye Distance:   Left Eye Distance:   Bilateral Distance:    Right Eye Near:   Left Eye Near:    Bilateral Near:     Physical Exam Vitals and nursing note reviewed.  Constitutional:      Appearance: She is not ill-appearing or toxic-appearing.  HENT:     Head: Normocephalic and atraumatic.     Right Ear: Hearing, tympanic membrane, ear canal and external ear normal.     Left Ear: Hearing, tympanic membrane, ear canal and  external ear normal.     Nose: Nose normal.     Mouth/Throat:     Lips: Pink.     Mouth: Mucous membranes are moist. No injury.     Tongue: No lesions. Tongue does not deviate from midline.     Palate: No mass and lesions.     Pharynx: Oropharynx is clear. Uvula midline. No pharyngeal swelling, oropharyngeal exudate, posterior oropharyngeal erythema or uvula swelling.     Tonsils: No tonsillar exudate or tonsillar abscesses.  Eyes:     General: Lids are normal. Vision grossly intact. Gaze aligned appropriately.     Extraocular Movements: Extraocular movements intact.     Conjunctiva/sclera: Conjunctivae normal.  Cardiovascular:     Rate and Rhythm: Normal rate and regular rhythm.     Heart sounds: Normal heart sounds, S1 normal and S2 normal.  Pulmonary:     Effort: Pulmonary effort is normal. No respiratory distress.     Breath sounds: Normal breath sounds and air entry.  Musculoskeletal:     Cervical back: Neck supple.  Skin:    General: Skin is warm and dry.     Capillary Refill: Capillary refill takes less than 2 seconds.     Findings: No rash.  Neurological:     General: No focal deficit present.     Mental Status: She is alert and oriented to person, place, and time. Mental status is at baseline.  Cranial Nerves: No dysarthria or facial asymmetry.  Psychiatric:        Mood and Affect: Mood normal.        Speech: Speech normal.        Behavior: Behavior normal.        Thought Content: Thought content normal.        Judgment: Judgment normal.      UC Treatments / Results  Labs (all labs ordered are listed, but only abnormal results are displayed) Labs Reviewed  CBG MONITORING, ED    EKG   Radiology No results found.  Procedures Procedures (including critical care time)  Medications Ordered in UC Medications  dexamethasone (DECADRON) injection 10 mg (10 mg Intramuscular Given 11/12/22 1502)    Initial Impression / Assessment and Plan / UC Course  I  have reviewed the triage vital signs and the nursing notes.  Pertinent labs & imaging results that were available during my care of the patient were reviewed by me and considered in my medical decision making (see chart for details).   1.  Allergic reaction, mild tongue swelling Presentation is consistent with allergic reaction.  No rash or urticaria.  Maintaining airway without difficulty.  Speaking in full sentences without difficulty as well.  No respiratory distress.  Deferred imaging based on stable cardiopulmonary exam and hemodynamically stable vital signs.  Patient seen in triage and given dexamethasone 10 mg IM for tongue swelling, states symptoms have improved after 30-minute observation period.  HEENT exam is stable.  May use Zyrtec daily to help prevent allergic reactions.  Advised to avoid known triggers.  May also use Pepcid 20 mg once daily for the next 5 to 7 days to further suppress allergic reaction.  Advised to keep a diary of triggers and bring this to allergist appointment.  Recommend follow-up with [facility] for ongoing evaluation and management of allergic reactions.  She is agreeable with this plan.  Dry mouth CBG is 97 in clinic.  Recommend follow-up with PCP for ongoing evaluation and management of dry mouth symptoms.  Symptoms may be due to her Lasix medication as this has mild anticholinergic effects.  Annual physical recommended to screen for diabetes and other medical conditions causing dry mouth.  She is agreeable with this plan.   Discussed physical exam and available lab work findings in clinic with patient.  Counseled patient regarding appropriate use of medications and potential side effects for all medications recommended or prescribed today. Discussed red flag signs and symptoms of worsening condition,when to call the PCP office, return to urgent care, and when to seek higher level of care in the emergency department. Patient verbalizes  understanding and agreement with plan. All questions answered. Patient discharged in stable condition.    Final Clinical Impressions(s) / UC Diagnoses   Final diagnoses:  Allergic reaction, initial encounter  Mild tongue swelling     Discharge Instructions      Please go back to [facility] specialist for further evaluation and continued workup to identify allergic reaction trigger.  I gave you a shot of steroid in the clinic to help reduce inflammation today.  Purchase Zyrtec over-the-counter and take this daily.  Purchase Pepcid over-the-counter (famotidine) and take 20 mg once daily to further suppress allergic reaction for the next 5 to 7 days.  Avoid eating biscuit Katy Apo until we are able to figure out what is causing your allergic reactions.  Keep a diary of triggers to bring to the  allergist.  If you develop any new or worsening symptoms or do not improve in the next 2 to 3 days, please return.  If your symptoms are severe, please go to the emergency room.  Follow-up with your primary care provider for further evaluation and management of your symptoms as well as ongoing wellness visits.  I hope you feel better!    ED Prescriptions   None    PDMP not reviewed this encounter.   Talbot Grumbling, [place] 11/12/22 1605

## 2022-11-12 NOTE — ED Triage Notes (Addendum)
Reports in the last hour, tongue started swelling and reports swelling to right side of throat.  Patient reports mouth is dry  Patient reports having this before.  Patient ate ham biscuit and hash rounds and sweet tea.    Patient is speaking in complete sentences.  Patient is able to swallow.  Breathing is not labored and speech is normal.  No hives present, no itching.    Patient has taken ibuprofen prior to coming in (for inflammation).

## 2022-11-12 NOTE — Discharge Instructions (Addendum)
Please go back to [facility] specialist for further evaluation and continued workup to identify allergic reaction trigger.  I gave you a shot of steroid in the clinic to help reduce inflammation today.  Purchase Zyrtec over-the-counter and take this daily.  Purchase Pepcid over-the-counter (famotidine) and take 20 mg once daily to further suppress allergic reaction for the next 5 to 7 days.  Avoid eating biscuit Katy Apo until we are able to figure out what is causing your allergic reactions.  Keep a diary of triggers to bring to the allergist.  If you develop any new or worsening symptoms or do not improve in the next 2 to 3 days, please return.  If your symptoms are severe, please go to the emergency room.  Follow-up with your primary care provider for further evaluation and management of your symptoms as well as ongoing wellness visits.  I hope you feel better!

## 2022-11-12 NOTE — ED Notes (Signed)
Sharyn Lull, NP in treatment room

## 2022-11-12 NOTE — ED Notes (Signed)
Reported cbg 97 to michelle stanhope, NP.  No further orders.

## 2022-11-13 ENCOUNTER — Ambulatory Visit (INDEPENDENT_AMBULATORY_CARE_PROVIDER_SITE_OTHER): Payer: Medicaid Other

## 2022-11-13 VITALS — BP 130/85 | HR 76 | Ht 61.0 in | Wt 181.1 lb

## 2022-11-13 DIAGNOSIS — Z3042 Encounter for surveillance of injectable contraceptive: Secondary | ICD-10-CM

## 2022-11-13 MED ORDER — MEDROXYPROGESTERONE ACETATE 150 MG/ML IM SUSP
150.0000 mg | Freq: Once | INTRAMUSCULAR | Status: AC
  Administered 2022-11-13: 150 mg via INTRAMUSCULAR

## 2022-11-13 NOTE — Progress Notes (Signed)
Kara Fernandez here for Depo-Provera Injection. Injection administered without complication. Patient will return in 3 months for next injection between June 6 and June 20. Next annual visit due 04/2023.   Verdell Carmine, RN 11/13/2022  3:39 PM

## 2023-02-04 ENCOUNTER — Ambulatory Visit: Payer: Medicaid Other

## 2023-03-29 ENCOUNTER — Emergency Department (HOSPITAL_BASED_OUTPATIENT_CLINIC_OR_DEPARTMENT_OTHER)
Admission: EM | Admit: 2023-03-29 | Discharge: 2023-03-29 | Disposition: A | Discharge status: Home / Self Care | Payer: Medicaid Other | Attending: Emergency Medicine | Admitting: Emergency Medicine

## 2023-03-29 ENCOUNTER — Emergency Department (HOSPITAL_BASED_OUTPATIENT_CLINIC_OR_DEPARTMENT_OTHER): Payer: Medicaid Other

## 2023-03-29 ENCOUNTER — Encounter (HOSPITAL_BASED_OUTPATIENT_CLINIC_OR_DEPARTMENT_OTHER): Payer: Self-pay

## 2023-03-29 ENCOUNTER — Other Ambulatory Visit: Payer: Self-pay

## 2023-03-29 DIAGNOSIS — U071 COVID-19: Secondary | ICD-10-CM | POA: Insufficient documentation

## 2023-03-29 DIAGNOSIS — R509 Fever, unspecified: Secondary | ICD-10-CM | POA: Diagnosis present

## 2023-03-29 LAB — RESP PANEL BY RT-PCR (RSV, FLU A&B, COVID)  RVPGX2
Influenza A by PCR: NEGATIVE
Influenza B by PCR: NEGATIVE
Resp Syncytial Virus by PCR: NEGATIVE
SARS Coronavirus 2 by RT PCR: POSITIVE — AB

## 2023-03-29 MED ORDER — BENZONATATE 100 MG PO CAPS: 100.0000 mg | ORAL_CAPSULE | Freq: Three times a day (TID) | ORAL | 0 refills | Status: DC

## 2023-03-29 MED ORDER — ONDANSETRON 4 MG PO TBDP: ORAL_TABLET | ORAL | 0 refills | Status: DC

## 2023-03-29 NOTE — Discharge Instructions (Signed)
Take tylenol up to 2 pills 4 times a day and motrin up to 4 pills 3 times a day.  Drink plenty of fluids.  Return for worsening shortness of breath, headache, confusion. Follow up with your family doctor.

## 2023-03-29 NOTE — ED Provider Notes (Signed)
[facility] Provider Note   CSN: [id number] Arrival date & time: 03/29/23  2128     History  Chief Complaint  Patient presents with   Cough   Fever    Kara Fernandez is a 32 y.o. female.  32 yo F with a chief complaints of not feeling well.  Cough congestion fever headache.  Her daughter has been sick and she took her to be seen and was positive for COVID.  No other known sick contacts.  No difficulty breathing.  Decreased oral intake over the past 48 hours.   Cough Associated symptoms: fever   Fever Associated symptoms: cough        Home Medications Prior to Admission medications   Medication Sig Start Date End Date Taking? Authorizing Provider  benzonatate (TESSALON) 100 MG capsule Take 1 capsule (100 mg total) by mouth every 8 (eight) hours. 03/29/23  Yes Melene Plan, DO  ondansetron (ZOFRAN-ODT) 4 MG disintegrating tablet 4mg  ODT q4 hours prn nausea/vomit 03/29/23  Yes Melene Plan, DO  acetaminophen (TYLENOL) 500 MG tablet Take 2 tablets (1,000 mg total) by mouth every 8 (eight) hours as needed (pain). 03/20/22   Worthy Rancher, MD  BIOTIN PO Take by mouth.    [provider]  Blood Pressure Monitoring DEVI 1 each by Does not apply route once a week. Patient not taking: Reported on 09/08/2022 09/10/21   Marylene Land, CNM  ferrous sulfate 325 (65 FE) MG tablet Take 1 tablet (325 mg total) by mouth every other day. Patient not taking: Reported on 03/24/2022 03/20/22   Worthy Rancher, MD  furosemide (LASIX) 40 MG tablet Take 40 mg by mouth. Patient not taking: Reported on 11/13/2022    [provider]  ibuprofen (ADVIL) 600 MG tablet Take 1 tablet (600 mg total) by mouth every 6 (six) hours as needed (pain). Patient not taking: Reported on 09/08/2022 03/20/22   Worthy Rancher, MD  NIFEdipine (PROCARDIA XL) 30 MG 24 hr tablet Take 1 tablet (30 mg total) by mouth daily. Patient not taking:  Reported on 04/29/2022 03/24/22   Hermina Staggers, MD  oxyCODONE (OXY IR/ROXICODONE) 5 MG immediate release tablet Take 1 tablet (5 mg total) by mouth every 4 (four) hours as needed for breakthrough pain or severe pain. Patient not taking: Reported on 04/29/2022 03/20/22   Worthy Rancher, MD  valACYclovir (VALTREX) 500 MG tablet Take 500 mg by mouth daily as needed (cold sores). Patient not taking: Reported on 03/24/2022    [provider]  potassium chloride (K-DUR) 10 MEQ tablet Take 1 tablet (10 mEq total) by mouth daily. 10/22/18 12/26/19  Jacalyn Lefevre, MD      Allergies    Patient has no known allergies.    Review of Systems   Review of Systems  Constitutional:  Positive for fever.  Respiratory:  Positive for cough.     Physical Exam Updated Vital Signs BP (!) 145/91 (BP Location: Left Arm)   Pulse (!) 101   Temp 98.8 F (37.1 C) (Oral)   Resp 20   Ht 5\' 1"  (1.549 m)   Wt 83.9 kg   SpO2 99%   BMI 34.96 kg/m  Physical Exam Vitals and nursing note reviewed.  Constitutional:      General: She is not in acute distress.    Appearance: She is well-developed. She is not diaphoretic.  HENT:     Head: Normocephalic and atraumatic.  Eyes:  Pupils: Pupils are equal, round, and reactive to light.  Cardiovascular:     Rate and Rhythm: Normal rate and regular rhythm.     Heart sounds: No murmur heard.    No friction rub. No gallop.  Pulmonary:     Effort: Pulmonary effort is normal.     Breath sounds: No wheezing or rales.  Abdominal:     General: There is no distension.     Palpations: Abdomen is soft.     Tenderness: There is no abdominal tenderness.  Musculoskeletal:        General: No tenderness.     Cervical back: Normal range of motion and neck supple.  Skin:    General: Skin is warm and dry.  Neurological:     Mental Status: She is alert and oriented to person, place, and time.  Psychiatric:        Behavior: Behavior normal.     ED Results /  Procedures / Treatments   Labs (all labs ordered are listed, but only abnormal results are displayed) Labs Reviewed  RESP PANEL BY RT-PCR (RSV, FLU A&B, COVID)  [id number] - Abnormal; Notable for the following components:      Result Value   SARS Coronavirus 2 by RT PCR POSITIVE (*)    All other components within normal limits    EKG None  Radiology DG Chest 1 View  Result Date: 03/29/2023 CLINICAL DATA:  Cough and fever. EXAM: CHEST  1 VIEW COMPARISON:  11/06/2015 FINDINGS: The cardiomediastinal contours are normal. The lungs are clear. Pulmonary vasculature is normal. No consolidation, pleural effusion, or pneumothorax. No acute osseous abnormalities are seen. IMPRESSION: Negative radiograph of the chest. Electronically Signed   By: Narda Rutherford M.D.   On: 03/29/2023 22:06    Procedures Procedures    Medications Ordered in ED Medications - No data to display  ED Course/ Medical Decision Making/ A&P                                 Medical Decision Making Risk Prescription drug management.   32 yo F with a cc of not feeling well.  Going on for about 48 hours.  She is well-appearing nontoxic.  Clear lung sounds for me.  No obvious bacterial source of infection was found on exam.  She is COVID-positive which is not surprising since she was exposed to someone that has COVID.  Chest x-ray independently interpreted by me without focal infiltrate.  Will discharge home.  PCP follow-up.  11:43 PM:  I have discussed the diagnosis/risks/treatment options with the patient.  Evaluation and diagnostic testing in the emergency department does not suggest an emergent condition requiring admission or immediate intervention beyond what has been performed at this time.  They will follow up with PCP. We also discussed returning to the ED immediately if new or worsening sx occur. We discussed the sx which are most concerning (e.g., sudden worsening pain, fever, inability to tolerate by mouth) that  necessitate immediate return. Medications administered to the patient during their visit and any new prescriptions provided to the patient are listed below.  Medications given during this visit Medications - No data to display   The patient appears reasonably screen and/or stabilized for discharge and I doubt any other medical condition or other [facility] requiring further screening, evaluation, or treatment in the ED at this time prior to discharge.  Final Clinical Impression(s) / ED Diagnoses Final diagnoses:  COVID-19 virus infection    Rx / DC Orders ED Discharge Orders          Ordered    benzonatate (TESSALON) 100 MG capsule  Every 8 hours        03/29/23 2334    ondansetron (ZOFRAN-ODT) 4 MG disintegrating tablet        03/29/23 2334              Melene Plan, DO 03/29/23 2343

## 2023-03-29 NOTE — ED Triage Notes (Addendum)
Pt is having covid symptoms Dtg was dx with covid HA, cough and fever Vomiting started this evening

## 2023-11-03 IMAGING — US US OB TRANSVAGINAL
1 series · 15 of 28 positions shown · non-contrast
Comparison: 08/06/2021

CLINICAL DATA: Pregnancy of unknown anatomic location, still
spotting

EXAM:
TRANSVAGINAL OB ULTRASOUND
TECHNIQUE: Transvaginal ultrasound was performed for complete evaluation of the
gestation as well as the maternal uterus, adnexal regions, and
pelvic cul-de-sac.

[Series 1: us ob transvaginal · 69 acquisitions, 15 frames shown]
[im 1/69]
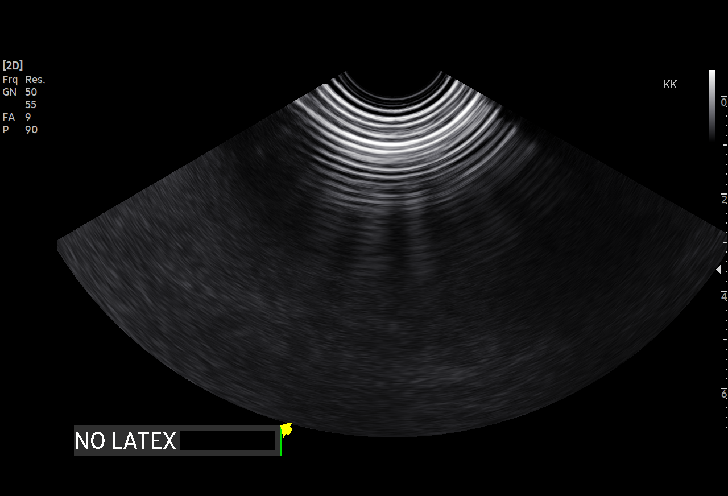
[im 6/69]
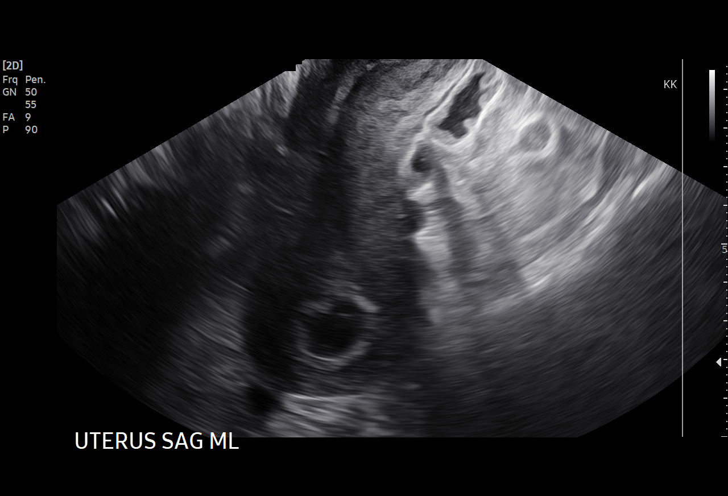
[im 11/69]
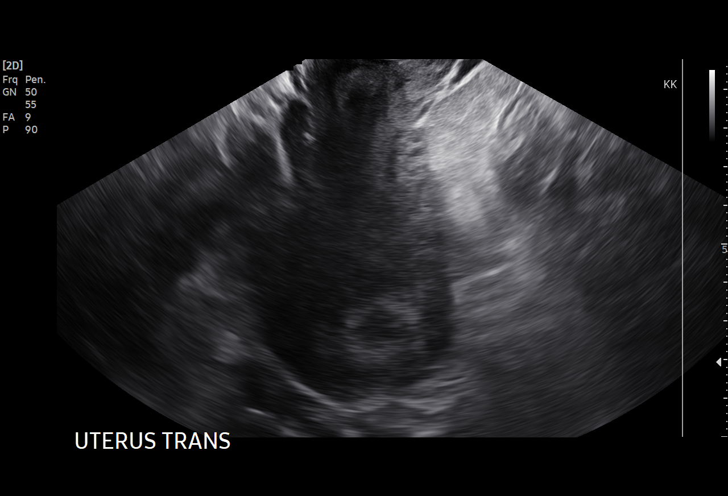
[im 16/69]
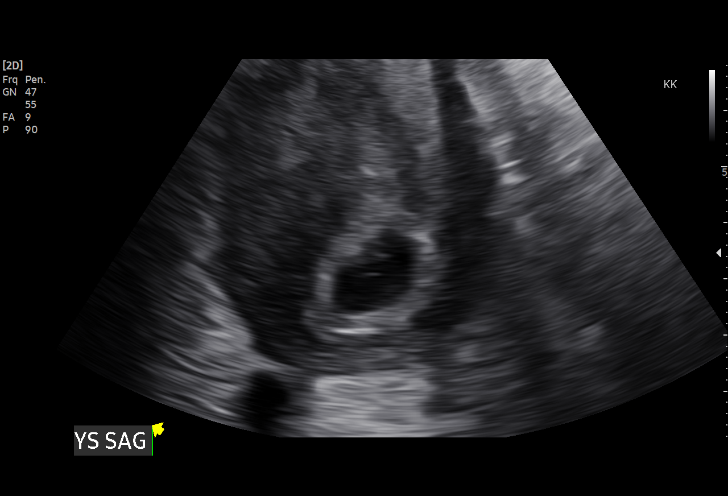
[im 21/69]
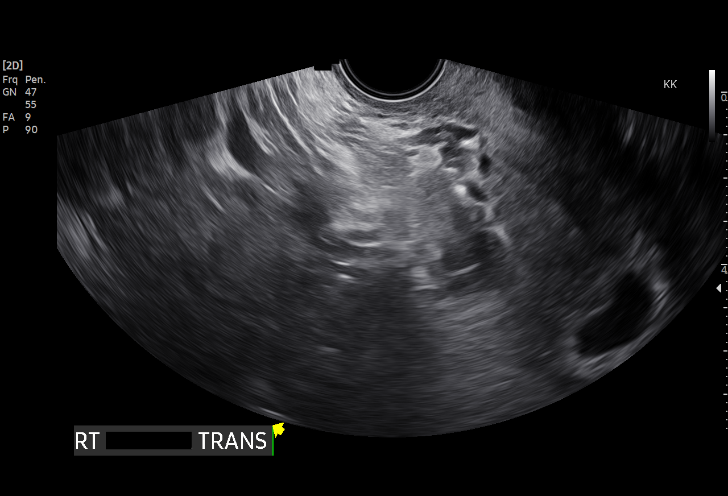
[im 26/69]
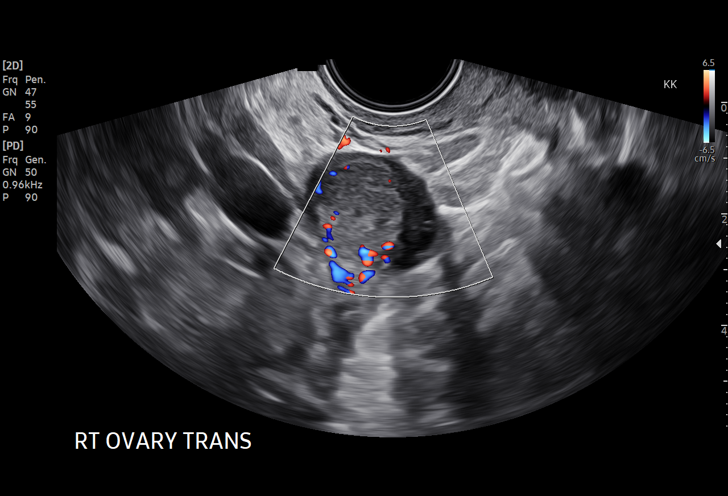
[im 31/69]
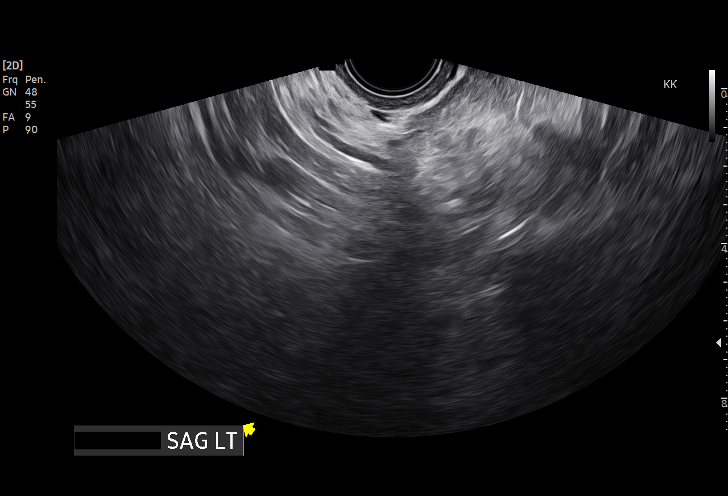
[im 36/69]
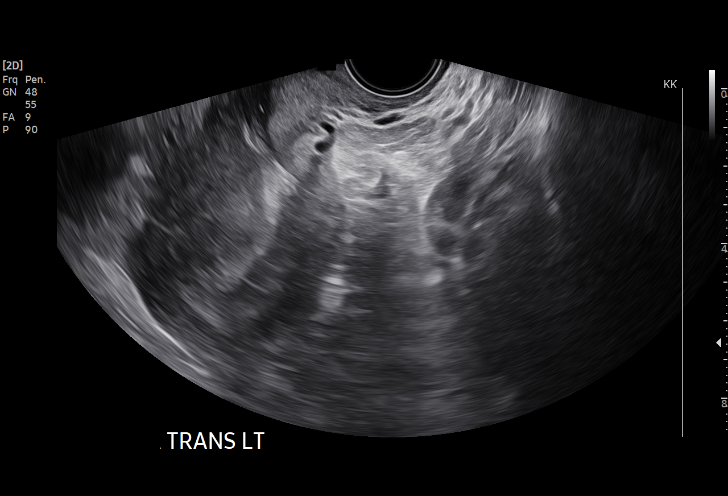
[im 38/69]
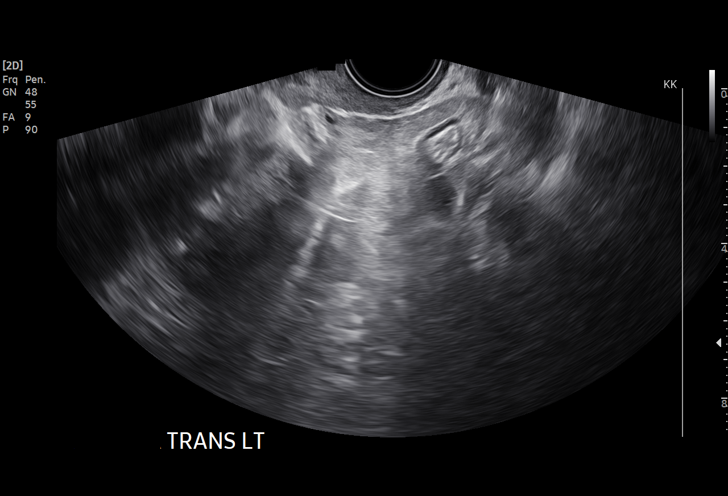
[im 43/69]
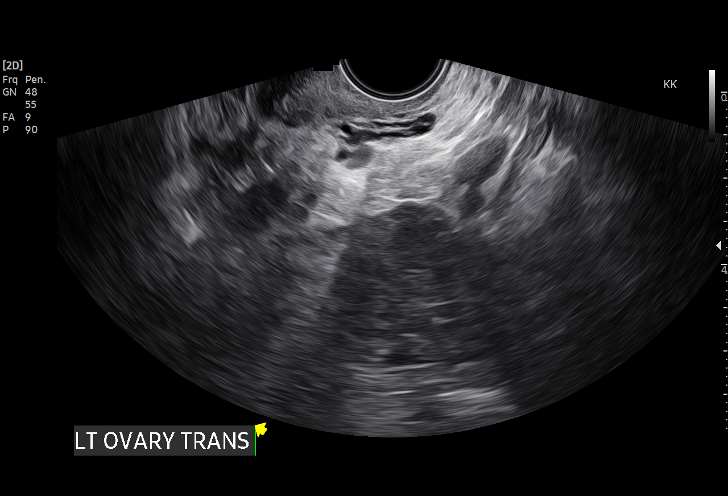
[im 48/69]
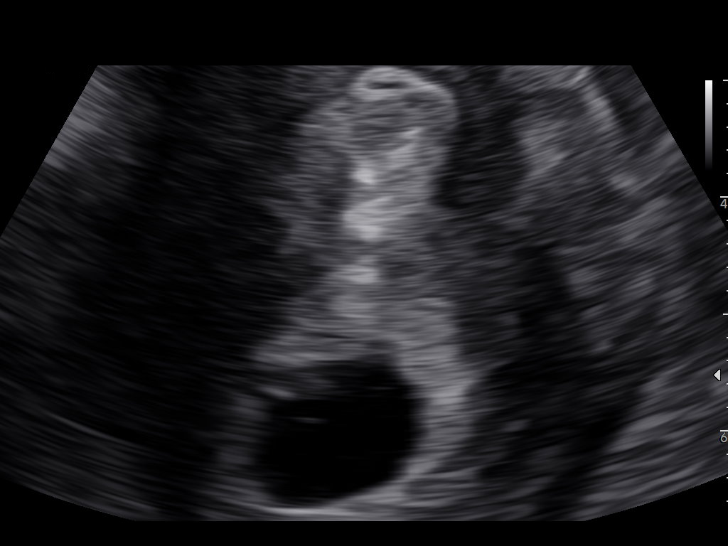
[im 53/69]
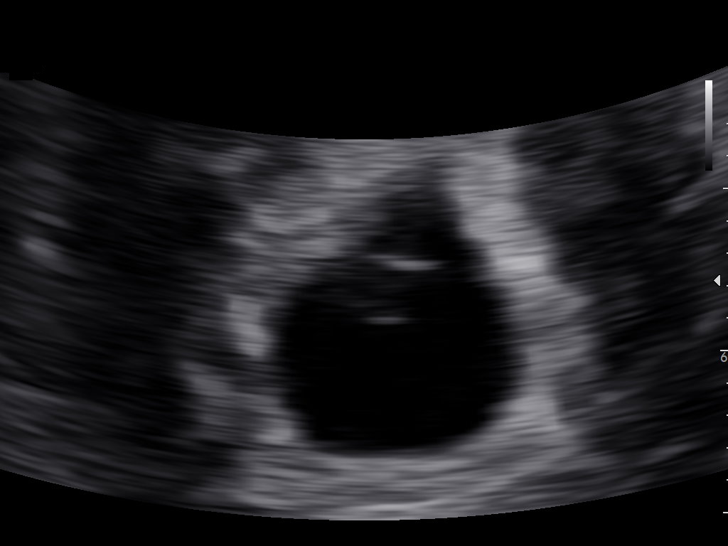
[im 58/69]
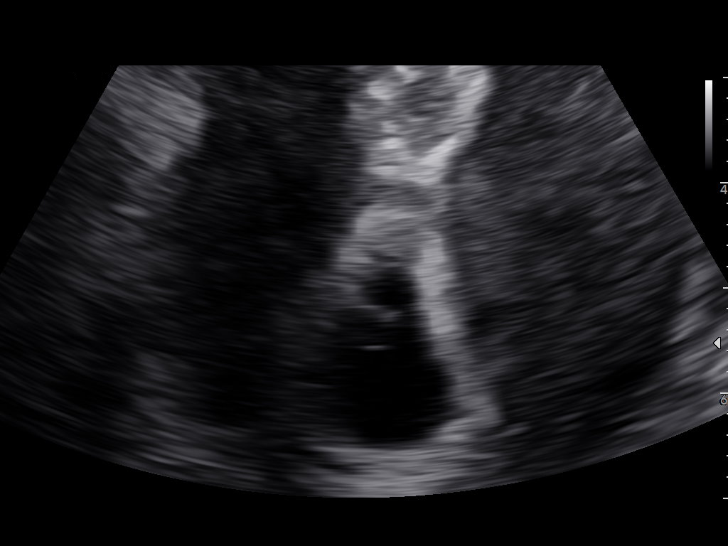
[im 63/69]
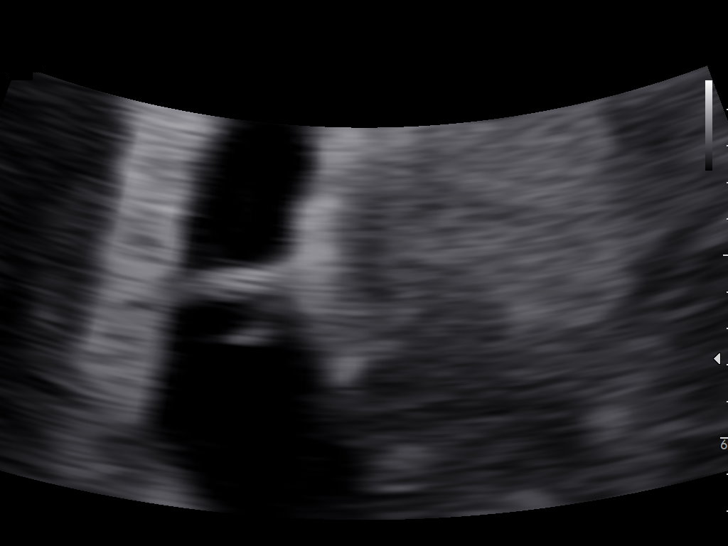
[im 69/69]
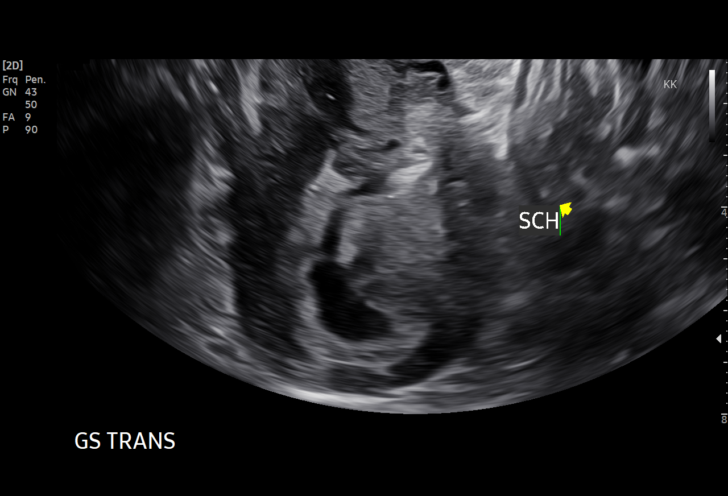

[15 of 28 positions shown; findings below may reference images not displayed]

FINDINGS: Intrauterine gestational sac: Present, single

Yolk sac:  Present

Embryo:  Present

Cardiac Activity: Present

Heart Rate: 107 bpm

CRL:   3.4 mm   6 w 0 d                  US EDC: 04/07/2022

Subchorionic hemorrhage:  Small subchronic hemorrhage

Maternal uterus/adnexae:

Remainder of uterus normal appearance.

Ovaries unremarkable.

Trace free pelvic fluid.

No adnexal masses.
IMPRESSION: Single live intrauterine gestation at 6 weeks 0 days EGA by
crown-rump length.

Small subchronic hemorrhage.

## 2023-11-19 IMAGING — US US OB TRANSVAGINAL
1 series · 15 of 28 positions shown · non-contrast
Comparison: 08/12/2021

CLINICAL DATA: Pregnancy of unknown anatomic location, assess
growth and viability; LMP 07/01/2021; [id number]

EXAM:
TRANSVAGINAL OB ULTRASOUND
TECHNIQUE: Transvaginal ultrasound was performed for complete evaluation of the
gestation as well as the maternal uterus, adnexal regions, and
pelvic cul-de-sac.

[Series 1: us ob transvaginal · 15 of 32 slices shown]
[im 1/32]
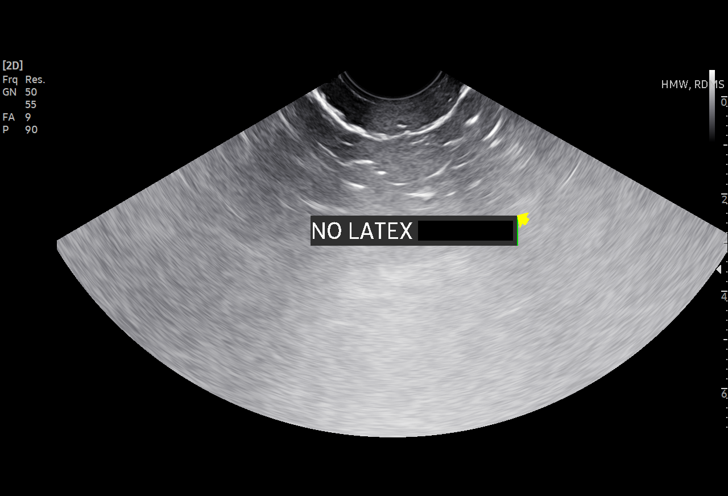
[im 3/32]
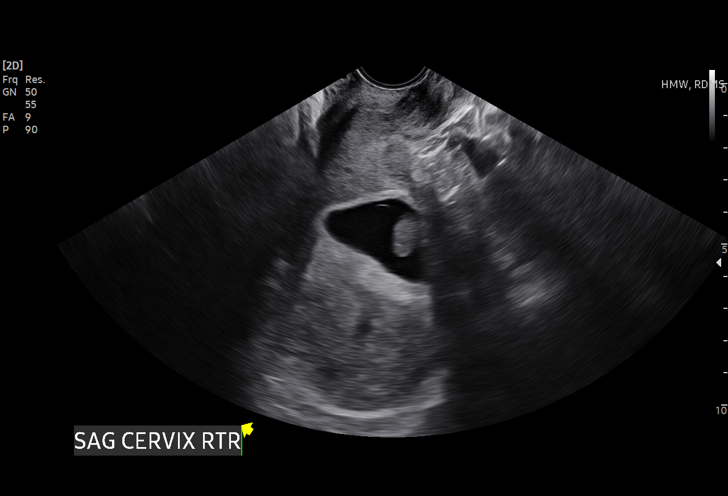
[im 5/32]
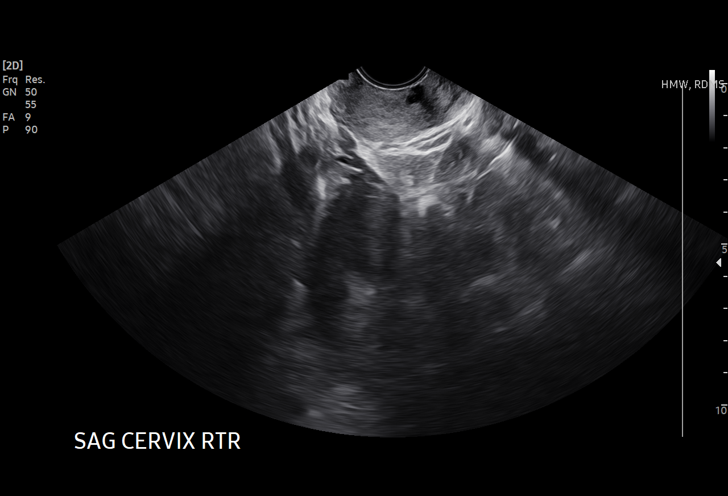
[im 7/32]
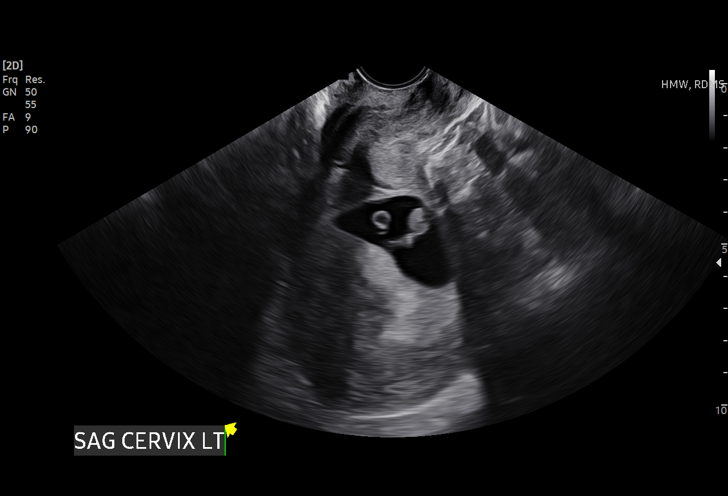
[im 10/32]
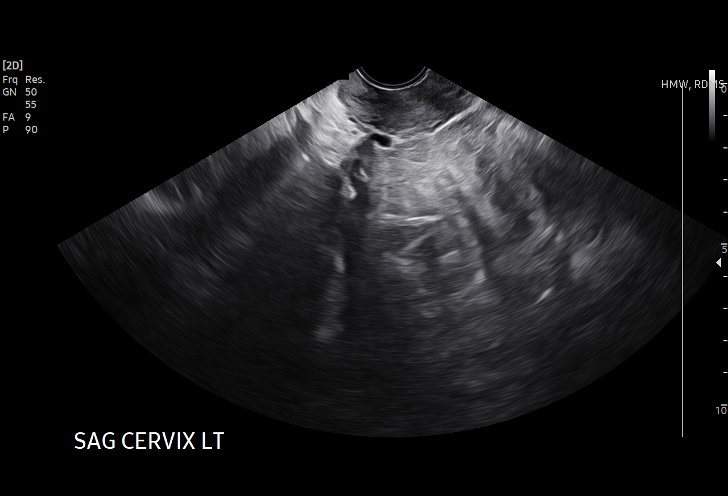
[im 12/32]
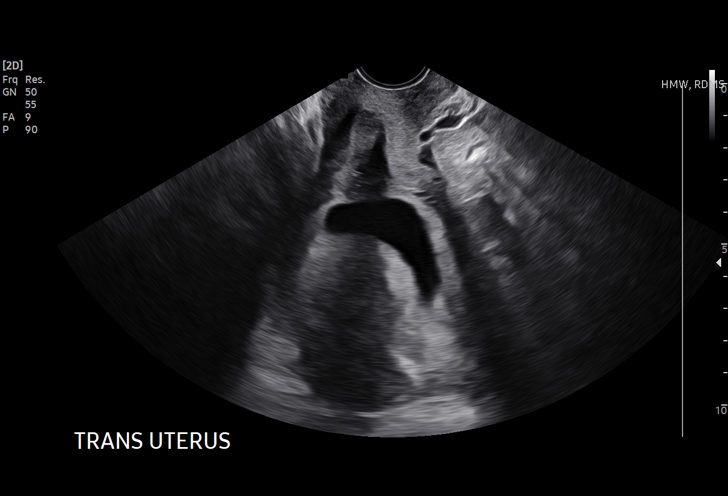
[im 14/32]
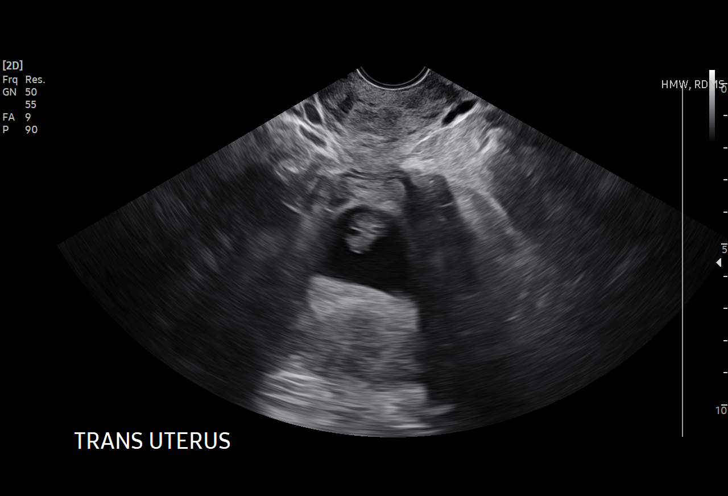
[im 17/32]
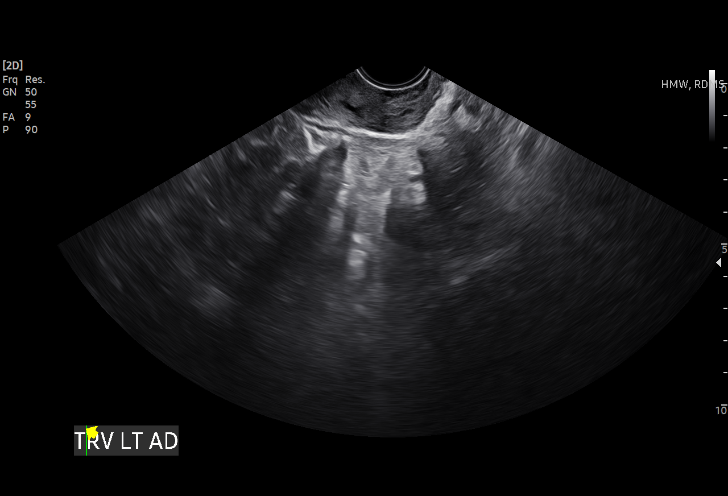
[im 18/32]
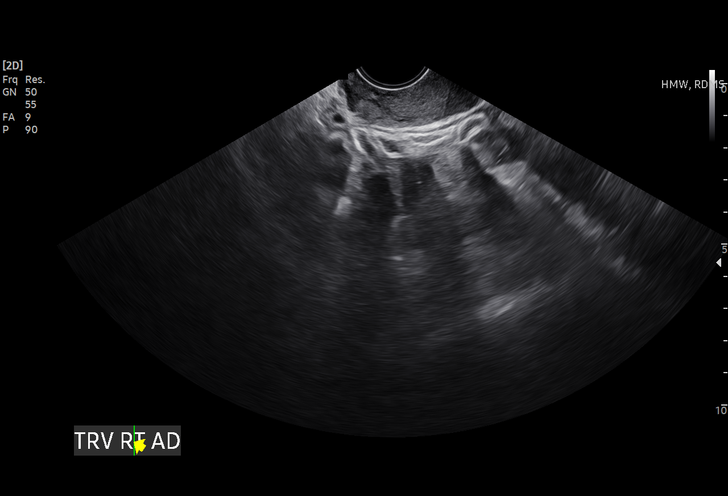
[im 20/32]
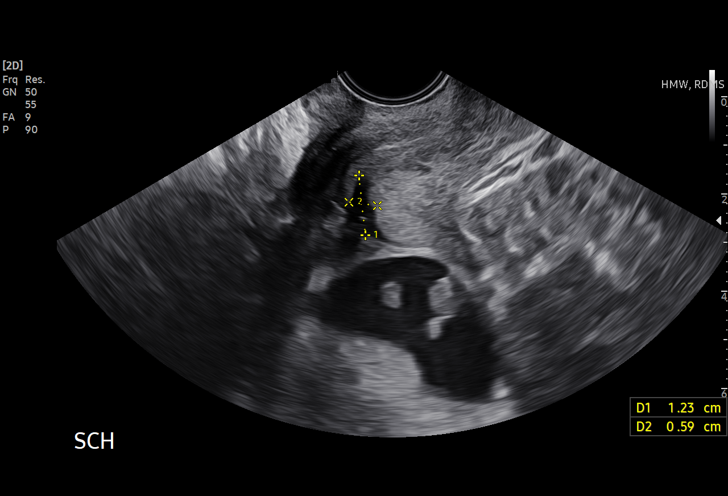
[im 22/32]
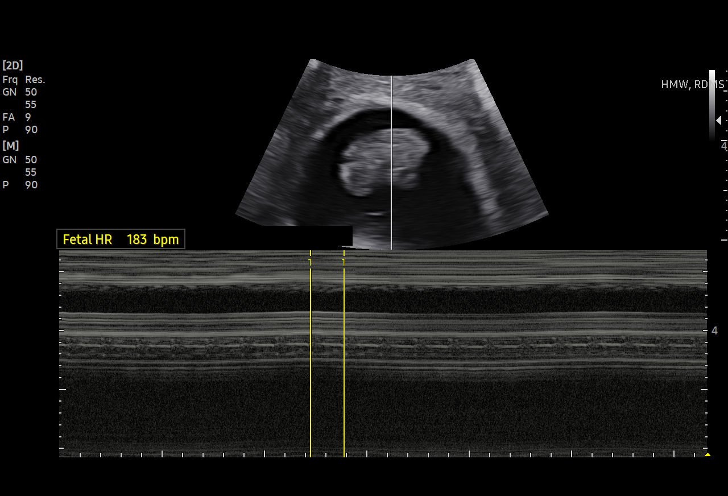
[im 25/32]
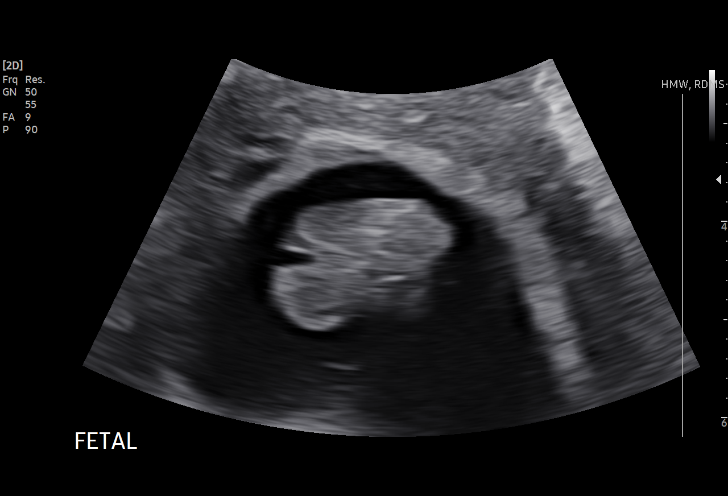
[im 27/32]
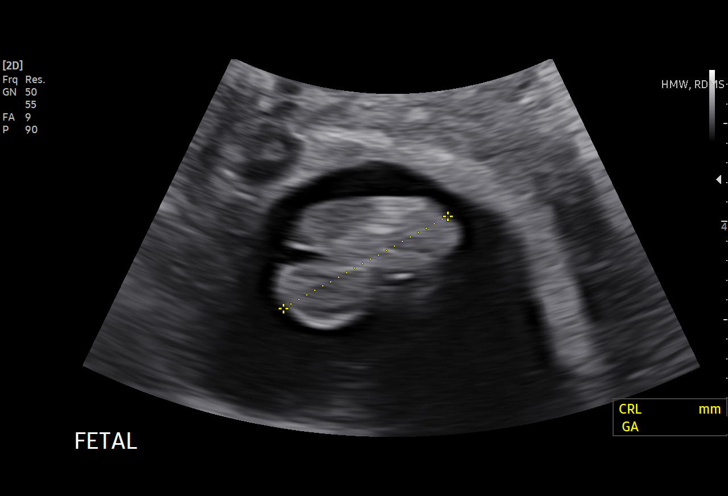
[im 29/32]
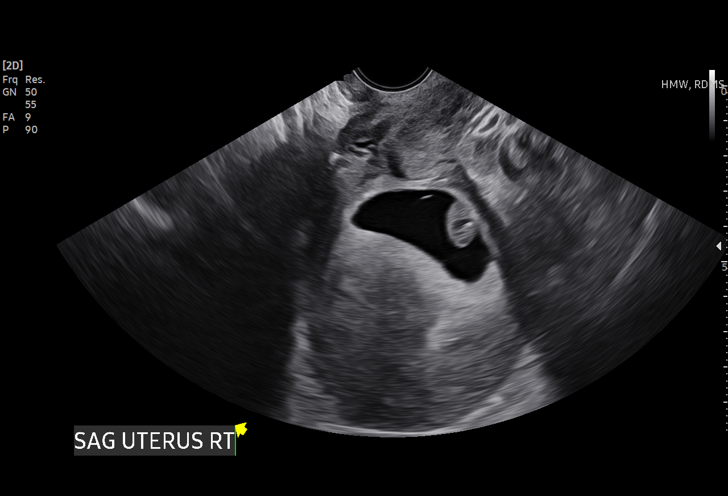
[im 32/32]
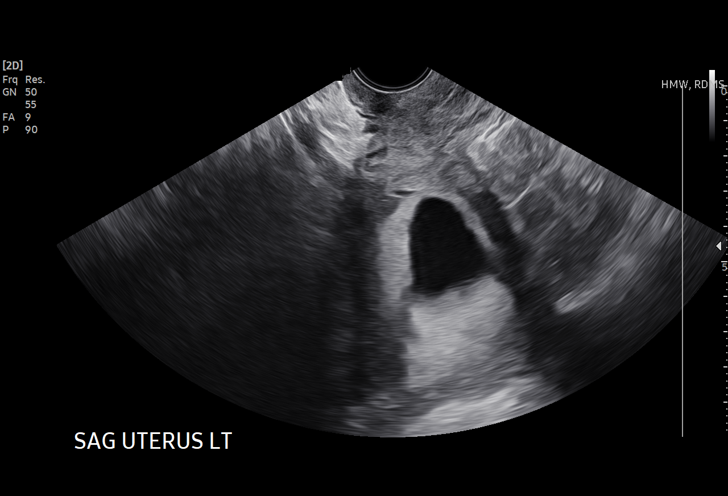

[15 of 28 positions shown; findings below may reference images not displayed]

FINDINGS: Intrauterine gestational sac: Present, single

Yolk sac:  Present

Embryo:  Present

Cardiac Activity: Present

Heart Rate: 183 bpm

CRL:   19.2 mm   8 w 3 d                  US EDC: 04/06/2022

Subchorionic hemorrhage:  Small subchronic hemorrhage

Maternal uterus/adnexae:

Uterus retroverted, otherwise unremarkable.

Neither ovary visualized.

No free pelvic fluid or adnexal masses.
IMPRESSION: Single live intrauterine gestation at 8 weeks 3 days EGA by
crown-rump length.

Small subchronic hemorrhage.

Nonvisualization of ovaries.

## 2024-09-21 ENCOUNTER — Emergency Department (HOSPITAL_BASED_OUTPATIENT_CLINIC_OR_DEPARTMENT_OTHER)
Admission: EM | Admit: 2024-09-21 | Discharge: 2024-09-22 | Disposition: A | Discharge status: Home / Self Care | Payer: Self-pay | Attending: Emergency Medicine | Admitting: Emergency Medicine

## 2024-09-21 ENCOUNTER — Encounter (HOSPITAL_BASED_OUTPATIENT_CLINIC_OR_DEPARTMENT_OTHER): Payer: Self-pay

## 2024-09-21 ENCOUNTER — Other Ambulatory Visit: Payer: Self-pay

## 2024-09-21 DIAGNOSIS — R112 Nausea with vomiting, unspecified: Secondary | ICD-10-CM | POA: Insufficient documentation

## 2024-09-21 DIAGNOSIS — R197 Diarrhea, unspecified: Secondary | ICD-10-CM | POA: Insufficient documentation

## 2024-09-21 DIAGNOSIS — J45909 Unspecified asthma, uncomplicated: Secondary | ICD-10-CM | POA: Insufficient documentation

## 2024-09-21 DIAGNOSIS — E860 Dehydration: Secondary | ICD-10-CM | POA: Insufficient documentation

## 2024-09-21 DIAGNOSIS — I10 Essential (primary) hypertension: Secondary | ICD-10-CM | POA: Insufficient documentation

## 2024-09-21 LAB — CBC WITH DIFFERENTIAL/PLATELET
Abs Immature Granulocytes: 0.03 10*3/uL (ref 0.00–0.07)
Basophils Absolute: 0 10*3/uL (ref 0.0–0.1)
Basophils Relative: 0 %
Eosinophils Absolute: 0 10*3/uL (ref 0.0–0.5)
Eosinophils Relative: 0 %
HCT: 41.2 % (ref 36.0–46.0)
Hemoglobin: 13.2 g/dL (ref 12.0–15.0)
Immature Granulocytes: 0 %
Lymphocytes Relative: 8 %
Lymphs Abs: 0.7 10*3/uL (ref 0.7–4.0)
MCH: 22.5 pg — ABNORMAL LOW (ref 26.0–34.0)
MCHC: 32 g/dL (ref 30.0–36.0)
MCV: 70.2 fL — ABNORMAL LOW (ref 80.0–100.0)
Monocytes Absolute: 0.2 10*3/uL (ref 0.1–1.0)
Monocytes Relative: 2 %
Neutro Abs: 8 10*3/uL — ABNORMAL HIGH (ref 1.7–7.7)
Neutrophils Relative %: 90 %
Platelets: 428 10*3/uL — ABNORMAL HIGH (ref 150–400)
RBC: 5.87 MIL/uL — ABNORMAL HIGH (ref 3.87–5.11)
RDW: 16.7 % — ABNORMAL HIGH (ref 11.5–15.5)
WBC: 8.9 10*3/uL (ref 4.0–10.5)
nRBC: 0 % (ref 0.0–0.2)

## 2024-09-21 LAB — RESP PANEL BY RT-PCR (RSV, FLU A&B, COVID)  RVPGX2
Influenza A by PCR: NEGATIVE
Influenza B by PCR: NEGATIVE
Resp Syncytial Virus by PCR: NEGATIVE
SARS Coronavirus 2 by RT PCR: NEGATIVE

## 2024-09-21 LAB — COMPREHENSIVE METABOLIC PANEL WITH GFR
ALT: 20 U/L (ref 0–44)
AST: 29 U/L (ref 15–41)
Albumin: 4.7 g/dL (ref 3.5–5.0)
Alkaline Phosphatase: 90 U/L (ref 38–126)
Anion gap: 17 — ABNORMAL HIGH (ref 5–15)
BUN: 5 mg/dL — ABNORMAL LOW (ref 6–20)
CO2: 18 mmol/L — ABNORMAL LOW (ref 22–32)
Calcium: 9.1 mg/dL (ref 8.9–10.3)
Chloride: 104 mmol/L (ref 98–111)
Creatinine, Ser: 0.71 mg/dL (ref 0.44–1.00)
GFR, Estimated: >60 mL/min
Glucose, Bld: 128 mg/dL — ABNORMAL HIGH (ref 70–99)
Potassium: 4 mmol/L (ref 3.5–5.1)
Sodium: 139 mmol/L (ref 135–145)
Total Bilirubin: 0.6 mg/dL (ref 0.0–1.2)
Total Protein: 8.1 g/dL (ref 6.5–8.1)

## 2024-09-21 LAB — URINALYSIS, ROUTINE W REFLEX MICROSCOPIC
Bilirubin Urine: NEGATIVE
Glucose, UA: NEGATIVE mg/dL
Ketones, ur: 15 mg/dL — AB
Leukocytes,Ua: NEGATIVE
Nitrite: NEGATIVE
Protein, ur: 30 mg/dL — AB
Specific Gravity, Urine: >=1.03 (ref 1.005–1.030)
pH: 6.5 (ref 5.0–8.0)

## 2024-09-21 LAB — URINALYSIS, MICROSCOPIC (REFLEX)

## 2024-09-21 LAB — PREGNANCY, URINE: Preg Test, Ur: NEGATIVE

## 2024-09-21 LAB — LIPASE, BLOOD: Lipase: 12 U/L (ref 11–51)

## 2024-09-21 MED ORDER — SODIUM CHLORIDE 0.9 % IV BOLUS
1000.0000 mL | Freq: Once | INTRAVENOUS | Status: AC
  Administered 2024-09-21: 1000 mL via INTRAVENOUS

## 2024-09-21 MED ORDER — ONDANSETRON HCL 4 MG/2ML IJ SOLN
4.0000 mg | Freq: Once | INTRAMUSCULAR | Status: AC
  Administered 2024-09-21: 4 mg via INTRAVENOUS
  Filled 2024-09-21: qty 2

## 2024-09-21 MED ORDER — KETOROLAC TROMETHAMINE 15 MG/ML IJ SOLN
15.0000 mg | Freq: Once | INTRAMUSCULAR | Status: AC
  Administered 2024-09-21: 15 mg via INTRAVENOUS
  Filled 2024-09-21: qty 1

## 2024-09-21 NOTE — ED Provider Notes (Signed)
 " [place] [facility] Provider Note   CSN: [id number] Arrival date & time: 09/21/24  2150     Patient presents with: Abdominal Pain and Emesis   Kara Fernandez is a 34 y.o. female.  {Add pertinent medical, surgical, social history, OB history to YEP:67052} The history is provided by the patient and a parent.  Emesis Associated symptoms: abdominal pain, chills and diarrhea   Patient with history of hypertension presents with nausea vomiting diarrhea that started this morning around 8 AM Patient reports multiple episodes of nonbloody emesis and diarrhea.  She also reports generalized abdominal cramping that is worsening No fevers.  No travel or antibiotics.  No known sick contacts. She has had mild cough. 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 Authorizing Provider  BIOTIN PO Take by mouth.    [provider]  NIFEdipine  (PROCARDIA  XL) 30 MG 24 hr tablet Take 1 tablet  (30 mg total) by mouth daily. Patient not taking: Reported on 04/29/2022 03/24/22   Ervin, Michael L, MD  ondansetron  (ZOFRAN -ODT) 4 MG disintegrating tablet 4mg  ODT q4 hours prn nausea/vomit 03/29/23   Floyd, Dan, DO  potassium chloride  (K-DUR) 10 MEQ tablet Take 1 tablet (10 mEq total) by mouth daily. 10/22/18 12/26/19  Dean Clarity, MD    Allergies: Patient has no known allergies.    Review of Systems  Constitutional:  Positive for chills.  Gastrointestinal:  Positive for abdominal pain, diarrhea, nausea and vomiting. Negative for blood in stool.    Updated Vital Signs BP (!) 137/96   Pulse 89   Temp 98.5 F (36.9 C) (Oral)   Resp 18   Ht 1.549 m (5' 1)   Wt 77.1 kg   LMP 09/09/2024 (Exact Date)   SpO2 100%   BMI 32.12 kg/m   Physical Exam CONSTITUTIONAL: Well developed/well nourished, anxious and uncomfortable appearing HEAD: Normocephalic/atraumatic EYES: EOMI/PERRL ENMT: Mucous membranes dry NECK: supple no meningeal signs CV: S1/S2 noted, no murmurs/rubs/gallops noted LUNGS: Lungs are clear to auscultation bilaterally, no apparent distress ABDOMEN: soft, nontender, no rebound or guarding, bowel sounds noted throughout abdomen GU:no cva tenderness NEURO: Pt is awake/alert/appropriate, moves all extremitiesx4.  No facial droop.  SKIN: warm, color normal  (all labs ordered are listed, but only abnormal results are displayed) Labs Reviewed  CBC WITH DIFFERENTIAL/PLATELET - Abnormal; Notable for the following components:      Result Value   RBC 5.87 (*)    MCV 70.2 (*)    MCH 22.5 (*)    RDW 16.7 (*)    Platelets 428 (*)    Neutro Abs 8.0 (*)    All other components within normal limits  COMPREHENSIVE METABOLIC PANEL WITH GFR - Abnormal; Notable for the following components:   CO2 18 (*)    Glucose, Bld 128 (*)    BUN 5 (*)    Anion gap 17 (*)    All other components within normal limits  URINALYSIS, ROUTINE W REFLEX MICROSCOPIC - Abnormal; Notable for the  following components:   Hgb urine dipstick TRACE (*)    Ketones, ur 15 (*)    Protein, ur 30 (*)    All other components within normal limits  URINALYSIS, MICROSCOPIC (REFLEX) - Abnormal; Notable for the following components:   Bacteria, UA FEW (*)    All other components within normal limits  RESP PANEL BY RT-PCR (RSV, FLU A&B, COVID)  RVPGX2  LIPASE, BLOOD  PREGNANCY, URINE    EKG: None  Radiology: No results found.  {Document cardiac monitor, telemetry assessment procedure when appropriate:32947} Procedures   Medications Ordered in the ED  ondansetron  (ZOFRAN ) injection 4 mg (4 mg Intravenous Given 09/21/24 2240)  sodium chloride  0.9 % bolus 1,000 mL (1,000 mLs Intravenous New Bag/Given 09/21/24 2333)  ondansetron  (ZOFRAN ) injection 4 mg (4 mg Intravenous Given 09/21/24 2334)  ketorolac  (TORADOL ) 15 MG/ML injection 15 mg (15 mg Intravenous Given 09/21/24 2334)    Clinical Course as of 09/21/24 2351  Wed Sep 21, 2024  2350 CO2(!): 18 Dehydration noted [DW]    Clinical Course User Index [DW] Midge Golas, MD   {Click here for ABCD2, HEART and other calculators REFRESH Note before signing:1}                              Medical Decision Making Amount and/or Complexity of Data Reviewed Labs: ordered. Decision-making details documented in ED Course.  Risk Prescription drug management.   This patient presents to the ED for concern of abdominal pain, vomiting diarrhea, this involves an extensive number of treatment options, and is a complaint that carries with it a high risk of complications and morbidity.  The differential diagnosis includes but is not limited to cholecystitis, cholelithiasis, pancreatitis, gastritis, peptic ulcer disease, appendicitis, bowel obstruction, bowel perforation, diverticulitis, gastroenteritis, ectopic pregnancy, UTI, kidney stone   Comorbidities that complicate the patient evaluation: Patients presentation is complicated by their history  of hypertension  Social Determinants of Health: Patients impaired access to primary care  increases the complexity of managing their presentation  Additional history obtained: Additional history obtained from mother  Lab Tests: I Ordered, and personally interpreted labs.  The pertinent results include: Dehydration noted  Imaging Studies ordered: I ordered imaging studies including {imaging:26848}  I independently visualized and interpreted imaging which showed *** I agree with the radiologist interpretation  Cardiac Monitoring: The patient was maintained on a cardiac monitor.  I personally viewed and interpreted the cardiac monitor which showed an underlying rhythm of:  {cardiac monitor:26849}  Medicines ordered and prescription drug management: I ordered medication including Toradol  for pain Reevaluation of the patient after these medicines showed that the  patient    {resolved/improved/worsened:23923::improved}  Test Considered: Patient is low risk / negative by ***, therefore do not feel that *** is indicated.  Critical Interventions:  ***  Consultations Obtained: I requested consultation with the {consultation:26851}, and discussed  findings as well as pertinent plan - they recommend: ***  Reevaluation: After the interventions noted above, I reevaluated the patient and found that they have :{resolved/improved/worsened:23923::improved}  Complexity of problems addressed: Patients presentation is most consistent with  {RNEJ:73156}  Disposition: After consideration of the diagnostic results and the patients response to treatment,  I feel that the patent would benefit from {disposition:26850}.     {Document critical care time when appropriate  Document review of labs and clinical decision tools ie CHADS2VASC2, etc  Document your independent review of radiology images and any outside records  Document your discussion with family members, caretakers and with  consultants  Document social determinants of health affecting pt's care  Document your decision making why or why not admission, treatments were needed:32947:::1}   Final diagnoses:  None    ED Discharge Orders     None        "

## 2024-09-21 NOTE — ED Triage Notes (Signed)
 Pt c/o abdominal pain started @8am  N/V/D

## 2024-09-22 MED ORDER — ONDANSETRON 8 MG PO TBDP: 8.0000 mg | ORAL_TABLET | Freq: Three times a day (TID) | ORAL | 0 refills | Status: AC | PRN

## 2024-09-22 MED ORDER — ONDANSETRON HCL 4 MG/2ML IJ SOLN
4.0000 mg | Freq: Once | INTRAMUSCULAR | Status: AC
  Administered 2024-09-22: 4 mg via INTRAVENOUS
  Filled 2024-09-22: qty 2

## 2024-09-22 NOTE — Discharge Instructions (Signed)

## 2024-09-22 NOTE — ED Notes (Signed)
 ED Provider at bedside.
